# Patient Record
Sex: Female | Born: 1985 | Race: White | Hispanic: No | Marital: Single | Smoking: Never smoker
Health system: Southern US, Community
[De-identification: ages and names within clinical notes are randomized; demographics above are authoritative.]

## PROBLEM LIST (undated history)

## (undated) DIAGNOSIS — C73 Malignant neoplasm of thyroid gland: Secondary | ICD-10-CM

## (undated) DIAGNOSIS — R519 Headache, unspecified: Secondary | ICD-10-CM

## (undated) DIAGNOSIS — F32A Depression, unspecified: Secondary | ICD-10-CM

## (undated) DIAGNOSIS — E785 Hyperlipidemia, unspecified: Secondary | ICD-10-CM

## (undated) DIAGNOSIS — G40909 Epilepsy, unspecified, not intractable, without status epilepticus: Secondary | ICD-10-CM

## (undated) DIAGNOSIS — R413 Other amnesia: Secondary | ICD-10-CM

## (undated) DIAGNOSIS — T783XXA Angioneurotic edema, initial encounter: Secondary | ICD-10-CM

## (undated) DIAGNOSIS — E039 Hypothyroidism, unspecified: Secondary | ICD-10-CM

## (undated) DIAGNOSIS — I2510 Atherosclerotic heart disease of native coronary artery without angina pectoris: Secondary | ICD-10-CM

## (undated) DIAGNOSIS — E0590 Thyrotoxicosis, unspecified without thyrotoxic crisis or storm: Secondary | ICD-10-CM

## (undated) HISTORY — DX: Malignant neoplasm of thyroid gland: C73

## (undated) HISTORY — DX: Thyrotoxicosis, unspecified without thyrotoxic crisis or storm: E05.90

## (undated) HISTORY — DX: Angioneurotic edema, initial encounter: T78.3XXA

## (undated) HISTORY — PX: THYROID SURGERY: SHX805

---
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
## 2015-09-28 HISTORY — PX: IMPLANTATION VAGAL NERVE STIMULATOR: SUR692

## 2015-09-28 HISTORY — PX: OTHER SURGICAL HISTORY: SHX169

## 2020-02-23 DIAGNOSIS — C73 Malignant neoplasm of thyroid gland: Secondary | ICD-10-CM | POA: Insufficient documentation

## 2020-02-23 DIAGNOSIS — E559 Vitamin D deficiency, unspecified: Secondary | ICD-10-CM | POA: Insufficient documentation

## 2020-02-23 DIAGNOSIS — Z85850 Personal history of malignant neoplasm of thyroid: Secondary | ICD-10-CM | POA: Insufficient documentation

## 2020-08-11 ENCOUNTER — Other Ambulatory Visit: Payer: Self-pay | Admitting: Otolaryngology

## 2020-08-11 DIAGNOSIS — E041 Nontoxic single thyroid nodule: Secondary | ICD-10-CM

## 2020-08-11 DIAGNOSIS — C73 Malignant neoplasm of thyroid gland: Secondary | ICD-10-CM

## 2020-08-19 ENCOUNTER — Other Ambulatory Visit: Payer: Self-pay

## 2020-09-06 DIAGNOSIS — E7800 Pure hypercholesterolemia, unspecified: Secondary | ICD-10-CM | POA: Insufficient documentation

## 2020-09-06 DIAGNOSIS — R569 Unspecified convulsions: Secondary | ICD-10-CM | POA: Insufficient documentation

## 2020-09-06 DIAGNOSIS — E039 Hypothyroidism, unspecified: Secondary | ICD-10-CM | POA: Insufficient documentation

## 2020-12-15 ENCOUNTER — Other Ambulatory Visit: Payer: Self-pay

## 2020-12-15 ENCOUNTER — Ambulatory Visit
Admission: RE | Admit: 2020-12-15 | Discharge: 2020-12-15 | Disposition: A | Discharge status: Home / Self Care | Payer: Medicare Other | Source: Ambulatory Visit | Attending: Otolaryngology | Admitting: Otolaryngology

## 2020-12-15 DIAGNOSIS — C73 Malignant neoplasm of thyroid gland: Secondary | ICD-10-CM

## 2020-12-15 DIAGNOSIS — E041 Nontoxic single thyroid nodule: Secondary | ICD-10-CM

## 2020-12-15 IMAGING — US US THYROID
1 series · 13 of 25 positions shown · non-contrast
Comparison: None.

CLINICAL DATA: Other. History of papillary thyroid carcinoma status
post right hemi lobectomy.

EXAM:
THYROID ULTRASOUND
TECHNIQUE: Ultrasound examination of the thyroid gland and adjacent soft
tissues was performed.

[Series 1: us thyroid · 0.08mm/px · 13 of 32 slices shown]
[im 1/32]
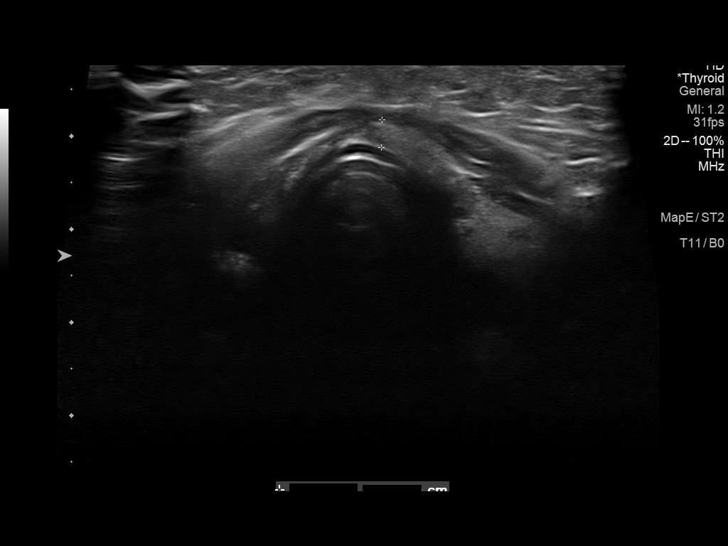
[im 3/32]
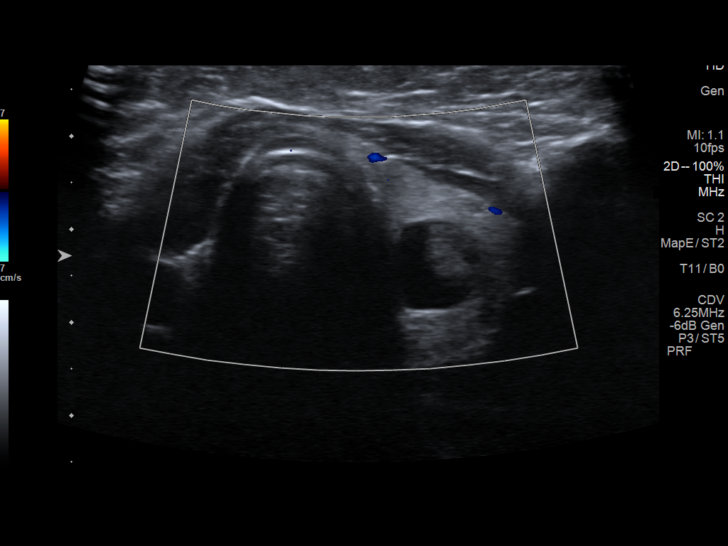
[im 6/32]
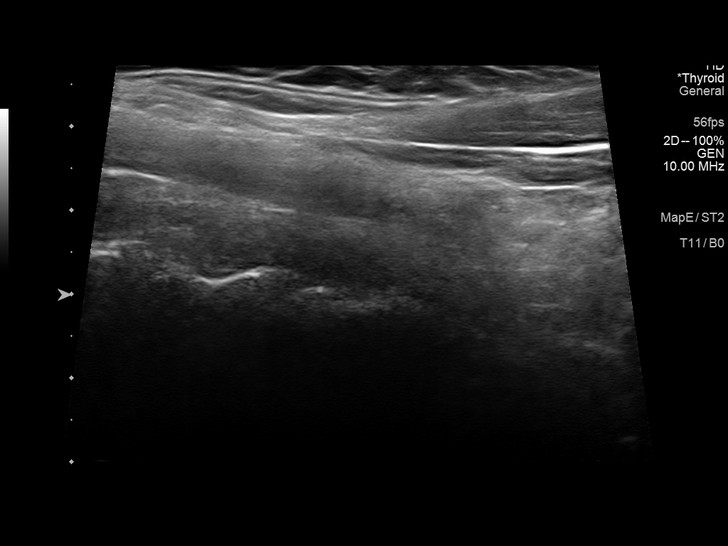
[im 8/32]
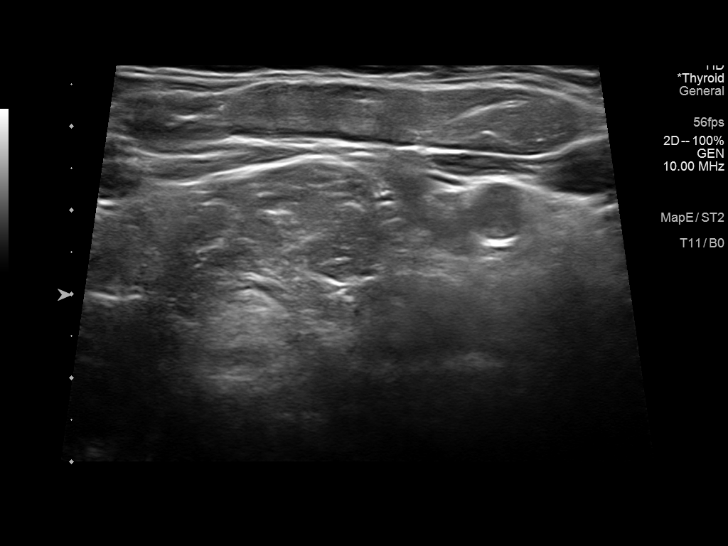
[im 11/32]
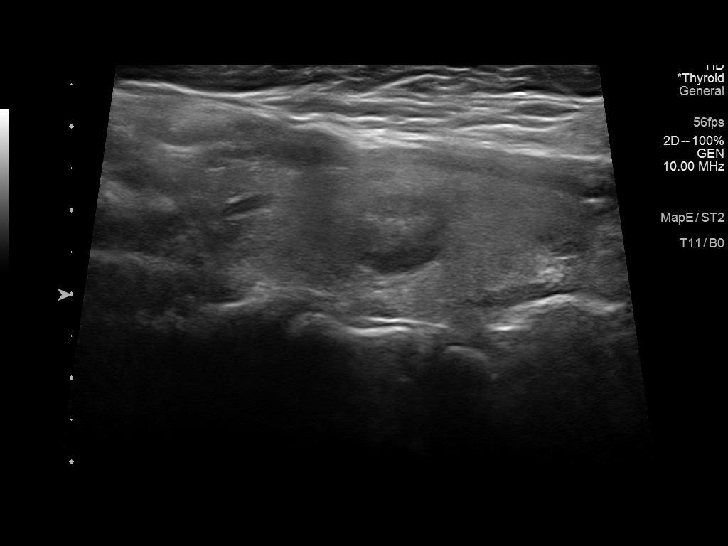
[im 13/32]
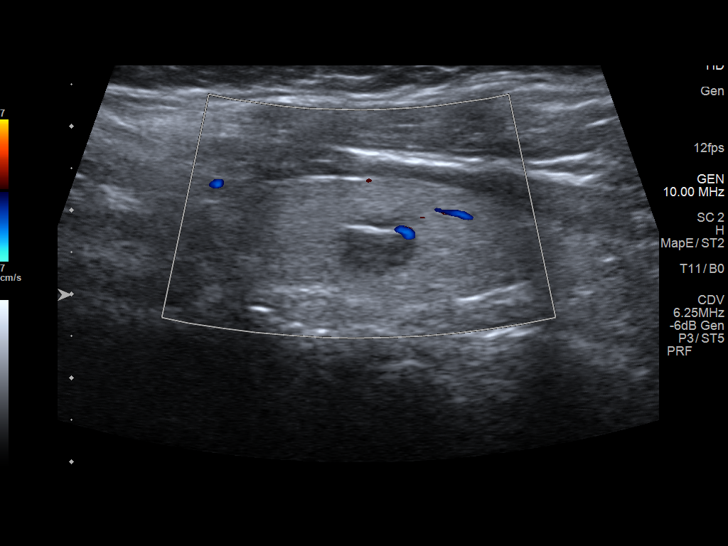
[im 16/32]
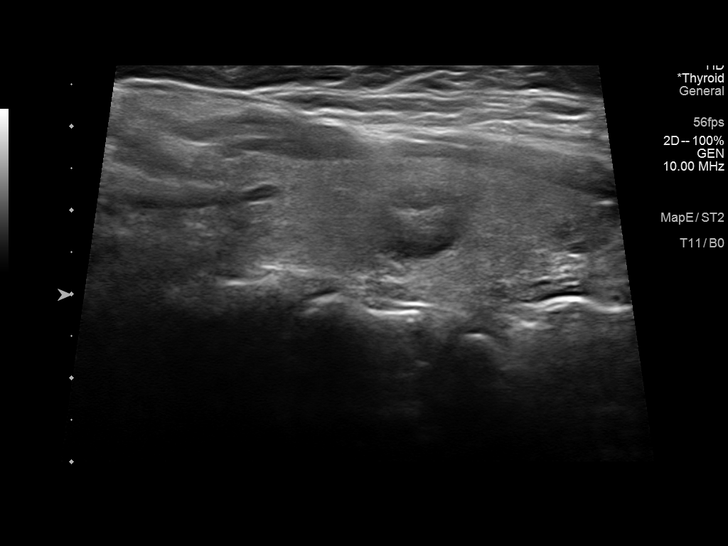
[im 19/32]
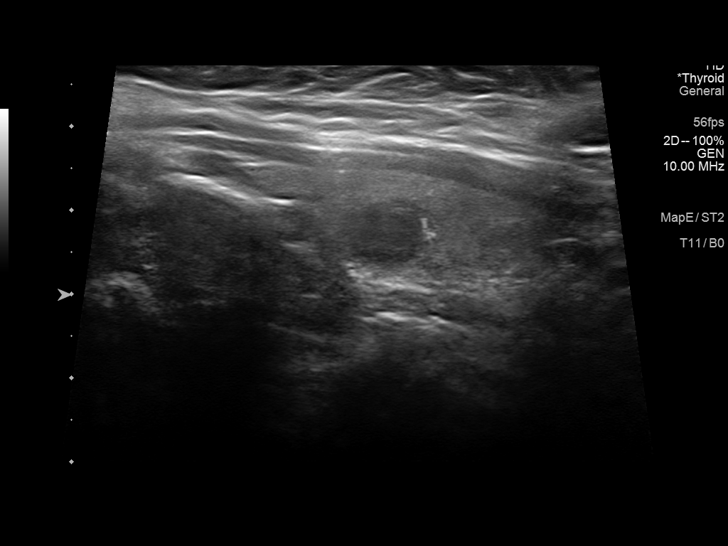
[im 21/32]
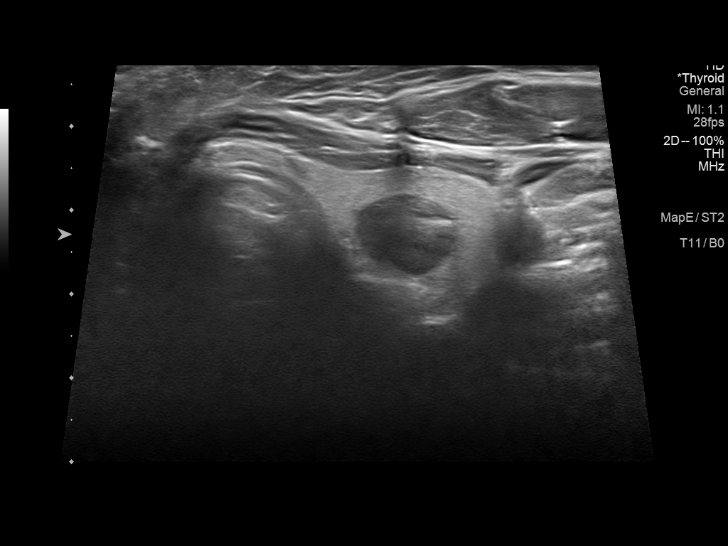
[im 24/32]
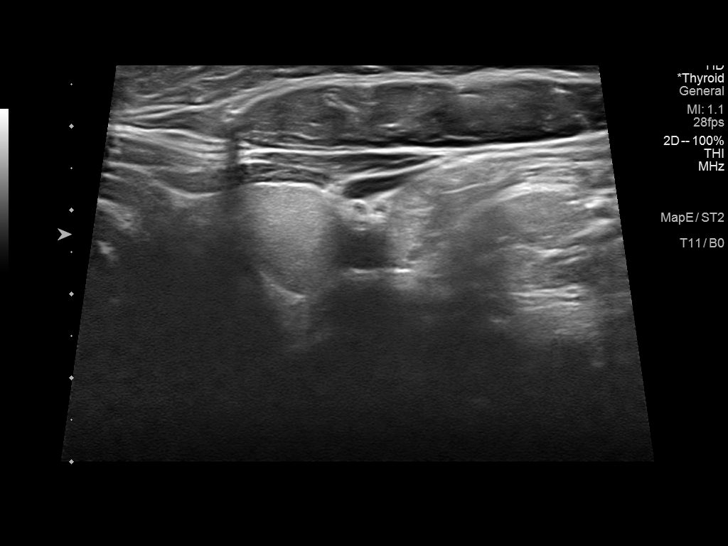
[im 26/32]
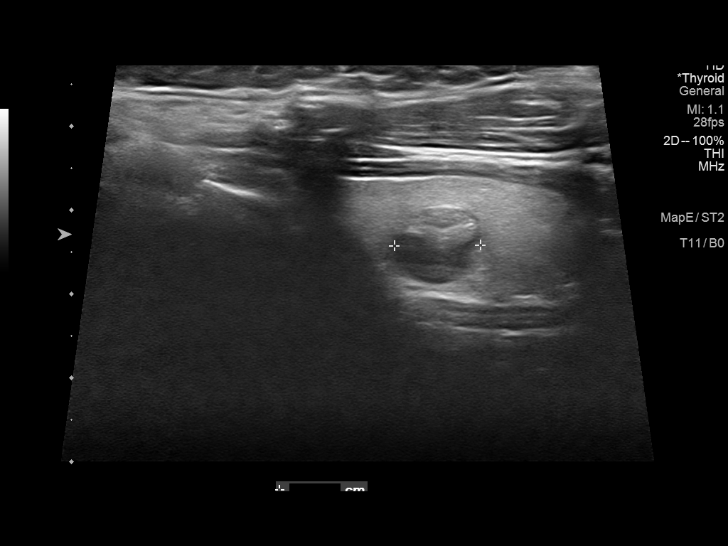
[im 29/32]
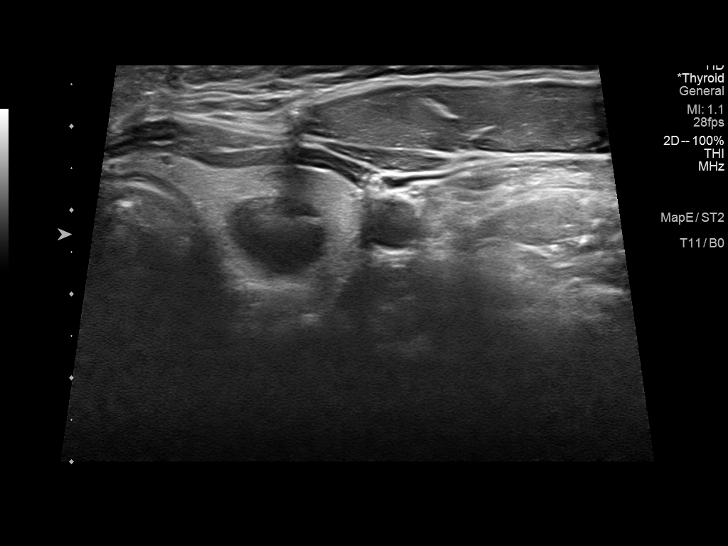
[im 32/32]
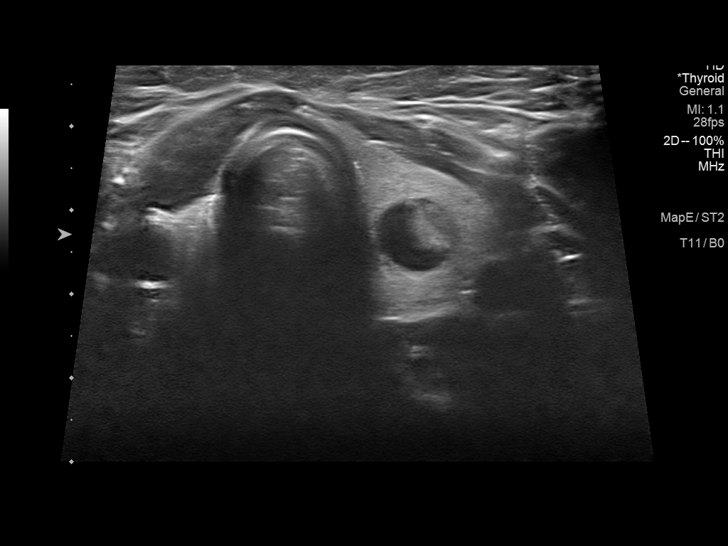

[13 of 25 positions shown; findings below may reference images not displayed]

FINDINGS: Parenchymal Echotexture: Mildly heterogenous

Isthmus: 0.3 cm

Right lobe: Surgically absent

Left lobe: 4.9 x 1.7 x 1.9 cm

_________________________________________________________

Estimated total number of nodules >/= 1 cm: 1

Number of spongiform nodules >/=  2 cm not described below (TR1): 0

Number of mixed cystic and solid nodules >/= 1.5 cm not described
below (TR2): 0

_________________________________________________________

Small 1.2 x 1.0 x 0.9 cm mixed cystic and solid nodule in the left
mid gland. The solid component is isoechoic. This is consistent with
a TI-RADS category 2, low risk lesion, and does not meet criteria to
warrant further evaluation.

Surgical changes of prior right hemithyroidectomy. No evidence of
residual or recurrent thyroid tissue or nodularity.
IMPRESSION: 1. Surgical changes of prior right hemithyroidectomy without
evidence of residual or recurrent thyroid tissue, nodularity or
lymphadenopathy.
2. Small 1.2 cm mixed cystic and solid nodule noted incidentally in
the left mid gland. This is considered sonographically low risk and
does not meet criteria for biopsy or imaging surveillance.

The above is in keeping with the ACR TI-RADS recommendations - [HOSPITAL] [80];[DATE].

## 2020-12-22 ENCOUNTER — Other Ambulatory Visit: Payer: Self-pay | Admitting: Orthopedic Surgery

## 2020-12-22 ENCOUNTER — Other Ambulatory Visit: Payer: Self-pay | Admitting: Otolaryngology

## 2020-12-22 DIAGNOSIS — E041 Nontoxic single thyroid nodule: Secondary | ICD-10-CM

## 2021-01-16 ENCOUNTER — Encounter: Payer: Self-pay | Admitting: Neurology

## 2021-02-14 ENCOUNTER — Other Ambulatory Visit: Payer: Self-pay

## 2021-02-14 ENCOUNTER — Ambulatory Visit (INDEPENDENT_AMBULATORY_CARE_PROVIDER_SITE_OTHER): Payer: Medicare Other | Admitting: Neurology

## 2021-02-14 ENCOUNTER — Encounter: Payer: Self-pay | Admitting: Neurology

## 2021-02-14 VITALS — BP 107/81 | HR 109 | Ht 63.0 in | Wt 205.0 lb

## 2021-02-14 DIAGNOSIS — G40219 Localization-related (focal) (partial) symptomatic epilepsy and epileptic syndromes with complex partial seizures, intractable, without status epilepticus: Secondary | ICD-10-CM

## 2021-02-14 MED ORDER — TOPIRAMATE 200 MG PO TABS: 200.0000 mg | ORAL_TABLET | Freq: Two times a day (BID) | ORAL | 3 refills | Status: DC

## 2021-02-14 NOTE — Patient Instructions (Signed)
Good to meet you!  Schedule MRI brain with and without contrast  2. Continue all your medications  3. Follow-up in 3-4 months, call for any changes   Seizure Precautions: 1. If medication has been prescribed for you to prevent seizures, take it exactly as directed.  Do not stop taking the medicine without talking to your doctor first, even if you have not had a seizure in a long time.   2. Avoid activities in which a seizure would cause danger to yourself or to others.  Don't operate dangerous machinery, swim alone, or climb in high or dangerous places, such as on ladders, roofs, or girders.  Do not drive unless your doctor says you may.  3. If you have any warning that you may have a seizure, lay down in a safe place where you can't hurt yourself.    4.  No driving for 6 months from last seizure, as per [facility].   Please refer to the following link on the [place] website for more information: http://[email or web address]/[email or web address]/drivingu.cfm   5.  Maintain good sleep hygiene.   6.  Notify your neurology if you are planning pregnancy or if you become pregnant.  7.  Contact your doctor if you have any problems that may be related to the medicine you are taking.  8.  Call 911 and bring the patient back to the ED if:        A.  The seizure lasts longer than 5 minutes.       B.  The patient doesn't awaken shortly after the seizure  C.  The patient has new problems such as difficulty seeing, speaking or moving  D.  The patient was injured during the seizure  E.  The patient has a temperature over 102 F (39C)  F.  The patient vomited and now is having trouble breathing

## 2021-02-14 NOTE — Progress Notes (Signed)
NEUROLOGY CONSULTATION NOTE  Sandra Collins MRN: [id number] DOB: 02-22-1986  Referring provider: Dr. Melissa Montane Primary care provider: Dr. Pricilla Holm  Reason for consult:  seizure  Dear Dr Raymond Gurney:  Thank you for your kind referral of Sandra Collins for consultation of the above symptoms. Although her history is well known to you, please allow me to reiterate it for the purpose of our medical record. The patient was accompanied to the clinic by her mother who also provides collateral information. Records and images were personally reviewed where available.   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 Records from Dr. Amalia Hailey and provided by her mother were reviewed. Seizures started at 29 months of age (10/1986). She was started on Depakote in 07/1989. She underwent EMU monitoring in [place] in 2007 and had left temporal lobe surgery in [place], [place] that was unsuccessful. VNS was placed in 2017. Main trigger noted for her seizures is a temperature of 99 degrees, whether from illness, physical activity, stress, or menstrual period. She started a CBD schedule in 2020 with her neurologist in [place] and they were able to wean off Lamotrigine in 2021. She continues on Topiramate 200mg  BID and CBD 70mg  BID. CBD is obtained from [employer] in [place].  Seizure semiology has included staring with bilateral hand automatisms followed by generalized tonic posturing, and drop attacks. She will have an aura of grabbing on to something then has loss of consciousness. Her mother notes shakiness and pupils dilate. On her last visit with Dr. Amalia Hailey in 08/2020, they reported her last seizure with loss of consciousness/GTC was in 12/2019, however today report that she had 4 seizures last year with spacing out or behavioral arrest for 15 seconds. She had a  seizure yesterday in her sleep occurring on and off from 2:30am to 6am. She almost fell off the bed. She was biting her pillows and teddy bear trying to stay calm. She could feel her body shaking a little bit for 15-30 seconds, occurring repeatedly and waking her up. She did have a low grade temperature and took Ibuprofen and 2mg  lorazepam, with no further events for the rest of the day. She denies any loss of consciousness with the recent seizures. She has rare body jerks. She denies any olfactory/gustatory hallucinations, focal numbness/tingling/weakness. She has rare migraines and takes Ibuprofen with good response. She has some swallowing difficulties due to her thyroid issues, and back pain due to scoliosis. No bowel/bladder dysfunction. Sleep is good. Mood is alright. She manages her own medications without issues.She lives with her mother and seizure dog. She does not drive.   Epilepsy Risk Factors:  A paternal aunt had epilepsy in childhood, 2 paternal cousins have epilepsy. She had a febrile convulsion in childhood. Otherwise she had a normal birth with no history of CNS infections such as meningitis/encephalitis, significant traumatic brain injury, neurosurgical procedures  Prior ASMs: Lamotrigine, Sabril, Dilantin, Trileptal, Lyrica, Onfi, Depakote, Zarontin, Felbatol, Neurontin, Tegretol, Phenobarbital    PAST MEDICAL HISTORY: Past Medical History:  Diagnosis Date   Hyperthyroidism    Thyroid cancer ([place])     PAST SURGICAL HISTORY: Past Surgical History:  Procedure Laterality Date   BRAIN SURGERY  05/03/2006   THYROID SURGERY     cancer   vns  09/28/2015    MEDICATIONS: Current Outpatient Medications on File Prior to Visit  Medication Sig Dispense Refill  Ascorbic Acid (VITAMIN C) 1000 MG tablet Take 1,000 mg by mouth daily.     Biotin 10000 MCG TABS Take by mouth.     calcium carbonate (OSCAL) 1500 (600 Ca) MG TABS tablet Take by mouth daily.     Cholecalciferol 50 MCG  (2000 UT) TABS Take by mouth.     [employer] FIBER GUMMY BEARS CHILDREN PO Take by mouth.     cyanocobalamin 1000 MCG tablet Take by mouth.     levothyroxine (SYNTHROID) 75 MCG tablet Take by mouth.     LORazepam (ATIVAN) 2 MG tablet Take 2 mg by mouth. As needed for seizures     medroxyPROGESTERone Acetate 150 MG/ML SUSY Inject into the muscle.     Multiple Vitamins-Minerals (MULTIVITAMIN WITH MINERALS) tablet Take 1 tablet by mouth daily.     NON FORMULARY Place 70 mg under the tongue in the morning and at bedtime. CBD oil     Omega-3 Fatty Acids (FISH OIL) 1200 MG CPDR Take 1 capsule by mouth daily.     topiramate (TOPAMAX) 200 MG tablet Take 200 mg by mouth 2 (two) times daily.     No current facility-administered medications on file prior to visit.    ALLERGIES: Allergies  Allergen Reactions   Estrogens Other (See Comments)    Other reaction(s): Seizures   Amoxicillin Hives   Latex Hives   Pregabalin Other (See Comments)    Excessive weight gain Excessive weight gain Excessive weight gain     FAMILY HISTORY: History reviewed. No pertinent family history.  SOCIAL HISTORY: Social History   Socioeconomic History   Marital status: Single    Spouse name: Not on file   Number of children: Not on file   Years of education: Not on file   Highest education level: Not on file  Occupational History   Not on file  Tobacco Use   Smoking status: Never   Smokeless tobacco: Never  Vaping Use   Vaping Use: Never used  Substance and Sexual Activity   Alcohol use: Yes    Comment: rare   Drug use: Never   Sexual activity: Not on file  Other Topics Concern   Not on file  Social History Narrative   Right handed   Lives with mom   Social Determinants of Health   Financial Resource Strain: Not on file  Food Insecurity: Not on file  Transportation Needs: Not on file  Physical Activity: Not on file  Stress: Not on file  Social Connections: Not on file  Intimate Partner Violence:  Not on file     PHYSICAL EXAM: Vitals:   02/14/21 0848  BP: 107/81  Pulse: (!) 109  SpO2: 96%   General: No acute distress Head:  Normocephalic/atraumatic Skin/Extremities: No rash, no edema Neurological Exam: Mental status: alert and oriented to person, place, and time, no dysarthria or aphasia, Fund of knowledge is appropriate.  Recent and remote memory are intact, 3/3 delayed recall.  Attention and concentration are normal, 5/5 WORLD backward. Cranial nerves: CN I: not tested CN II: pupils equal, round and reactive to light, visual fields intact CN III, IV, VI:  full range of motion, no nystagmus, no ptosis CN V: facial sensation intact CN VII: upper and lower face symmetric CN VIII: hearing intact to conversation Bulk & Tone: normal, no fasciculations. Motor: 5/5 throughout with no pronator drift. Sensation: intact to light touch, cold, pin, vibration and joint position sense.  No extinction to double simultaneous stimulation.  Romberg test  negative Deep Tendon Reflexes: brisk +2 throughout Cerebellar: no incoordination on finger to nose testing Gait: narrow-based and steady, able to tandem walk adequately. Tremor: none  VNS Therapy Management: Unit Information Implant Date: 09/28/15 Serial Number: [id number] Generator Number: [id number] ([id number] [id number]) Parameters Output Current (mA): 2 Signal Frequency (Hz): 25 Pulse Width (usec): 500 Signal ON Time (sec): 30 Signal OFF Time (min): 5 Magnet Output Current (mA): 2.25 Magnet ON Time (sec): 60 Magnet Pulse Width (usec): 250 AutoStim Output Current (mA): 2 AutoStim Pulse Width (usec): 250 AutoStim ON Time (sec): 60 Tachycardia Detection : On Heartbeat Detection Sensitivity: 3 Perform Verify Heartbeat Detection: yes Threshold for AutoStim (%): 20 Diagnostics Current Delievered (mA): 2 Lead Impedance: OK Impedence Value (Ohms): 2818 Battery Status Indicator (color): Green (11-25%)   IMPRESSION: This is a 35 year old  right-handed woman with 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 Records were reviewed, she has not had any brain imaging since 2007 as far as they know. They deny any GTCs since 2021, but she continues to have focal seizures with impaired awareness and prolonged nocturnal shaking episodes. MRI brain with and without contrast will be scheduled. We may consider repeating EEG in the future if they are interested in other medication adjustments, she has been on numerous seizure medications in the past and has found the best response with the combination of Topiramate 200mg  BID and CBD obtained from a pharmacy in [place]. She has prn lorazepam 2mg  for seizure rescue. Continue all medications. VNS interrogated today, battery 11-25%, will discuss scheduling battery replacement on her next visit. She does not drive. Follow-up in 3-4 months, call for any changes.   Thank you for allowing me to participate in the care of this patient. Please do not hesitate to call for any questions or concerns.   Ellouise Newer, M.D.  CC: Dr. Raymond Gurney, Dr. Sharlet Salina

## 2021-03-14 ENCOUNTER — Telehealth: Payer: Self-pay | Admitting: Neurology

## 2021-03-14 NOTE — Telephone Encounter (Signed)
Pt needs a call back to discuss what day and time she needs to turn off her device. She sch her MRI 03/27/21 monday

## 2021-03-14 NOTE — Telephone Encounter (Signed)
Called patient and advised her that Dr. Delice Lesch will need to turn off her VNS one hour before the MRI then come back after her MRI so Dr. Delice Lesch can turn her VNS back on. Patient is aware her MRI is on 10/31 at 11:30am and will be at our office at 10:30 am. Patient asked when she can take her Ativan for the MRI and Dr. Delice Lesch has instructed patient to take her Ativan 30 mins before her MRI. Patient verbalized understanding and had no further questions or concerns.

## 2021-03-14 NOTE — Telephone Encounter (Signed)
I see her appt on 10/31 is at 11:50am for MRI. I will need to turn off her VNS before the MRI, if she can come to the office an hour before so I can turn it off and she can be on time for MRI. She will then come back to the office after MRI so I can turn it back on. Thanks!

## 2021-03-27 ENCOUNTER — Ambulatory Visit
Admission: RE | Admit: 2021-03-27 | Discharge: 2021-03-27 | Disposition: A | Discharge status: Home / Self Care | Payer: Medicare Other | Source: Ambulatory Visit | Attending: Neurology | Admitting: Neurology

## 2021-03-27 ENCOUNTER — Other Ambulatory Visit: Payer: Medicare Other

## 2021-03-27 DIAGNOSIS — G40219 Localization-related (focal) (partial) symptomatic epilepsy and epileptic syndromes with complex partial seizures, intractable, without status epilepticus: Secondary | ICD-10-CM

## 2021-03-27 IMAGING — MR MR HEAD WO/W CM
12 series · 43 of 48 positions shown · IV contrast (multihance)
Comparison: None.

CLINICAL DATA: Seizure.

EXAM:
MRI HEAD WITHOUT AND WITH CONTRAST
TECHNIQUE: Multiplanar, multiecho pulse sequences of the brain and surrounding
structures were obtained without and with intravenous contrast.
CONTRAST:  19mL MULTIHANCE GADOBENATE DIMEGLUMINE 529 MG/ML IV SOLN

[Series 2: T1 · sagittal · 5.0mm · 0.45mm/px · 2 of 25 slices shown]
[im 1/25]
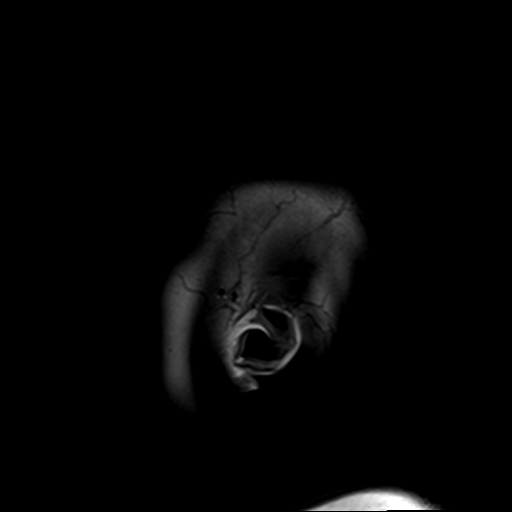
[im 25/25]
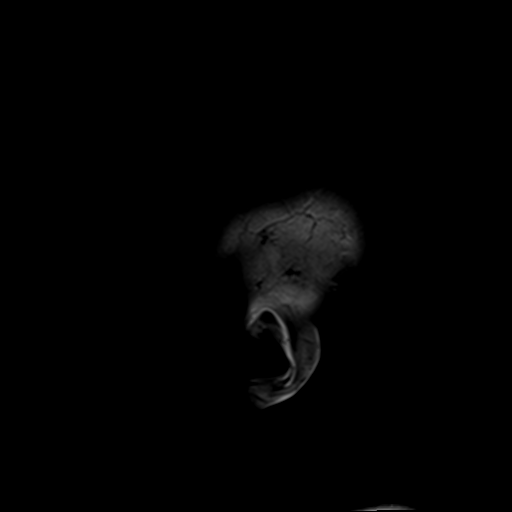

[Series 3: DWI · axial · 3.0mm · 1.80mm/px · z∈[-62,+82]mm · 7 of 100 slices shown (1 of 2)]
[im 1/100]
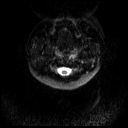
[im 17/100]
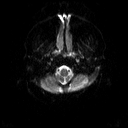
[im 34/100]
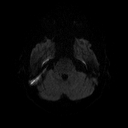
[im 50/100]
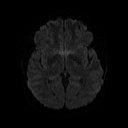
[im 67/100]
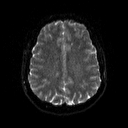
[im 83/100]
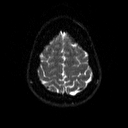
[im 100/100]
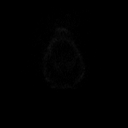

[Series 4: DWI · axial · 3.0mm · 1.80mm/px · z∈[-62,+82]mm · 4 of 50 slices shown (2 of 2)]
[im 1/50]
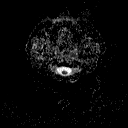
[im 17/50]
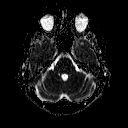
[im 33/50]
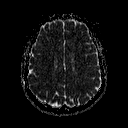
[im 50/50]
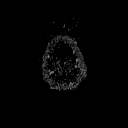

[Series 5: T2 · axial · 5.0mm · 0.60mm/px · z∈[-63,+77]mm · 2 of 22 slices shown (1 of 3)]
[im 1/22]
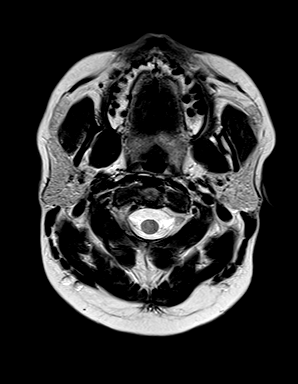
[im 22/22]
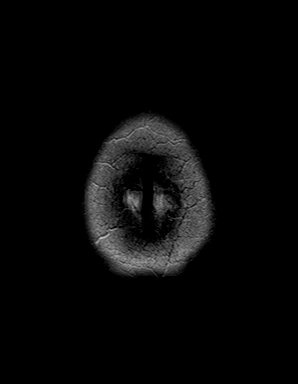

[Series 6: FLAIR · axial · 3.0mm · 0.45mm/px · z∈[-63,+78]mm · 2 of 32 slices shown (1 of 2)]
[im 1/32]
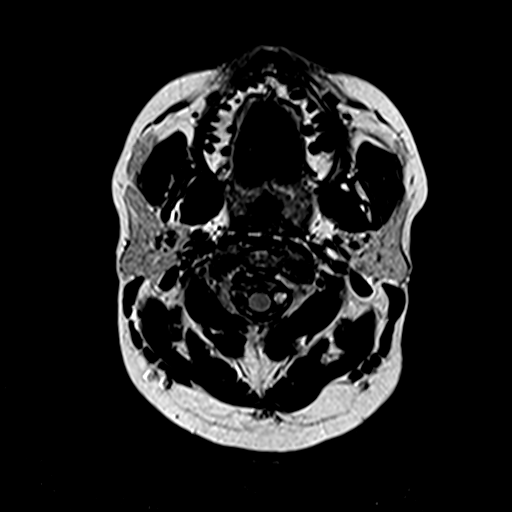
[im 32/32]
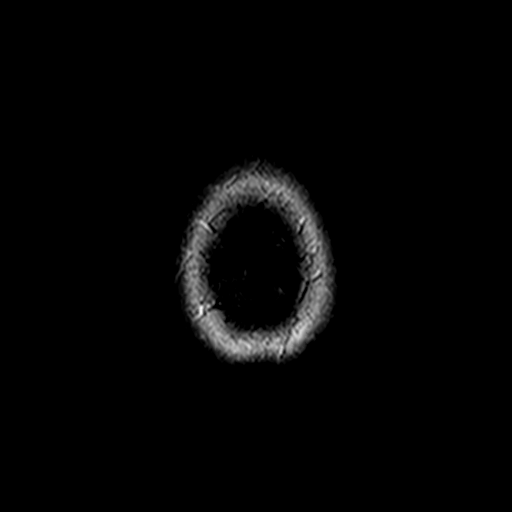

[Series 8: swi_images · axial · 4.0mm · 0.90mm/px · z∈[-61,+76]mm · 3 of 36 slices shown]
[im 1/36]
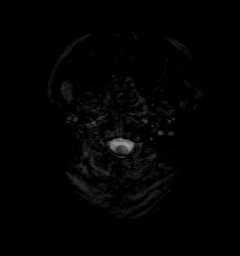
[im 18/36]
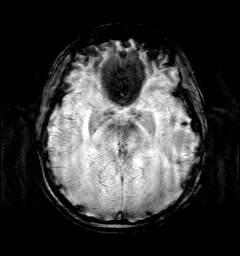
[im 36/36]
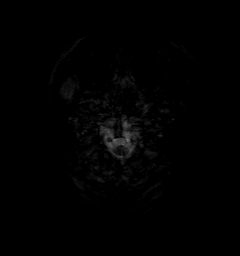

[Series 9: t1_mpr_tra · axial · 1.0mm · 0.71mm/px · z∈[-64,+77]mm · 8 of 144 slices shown (1 of 2)]
[im 1/144]
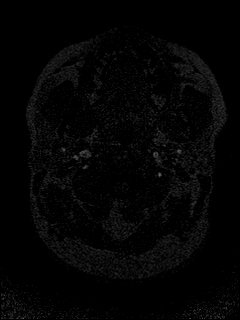
[im 16/144]
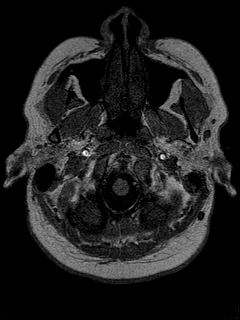
[im 48/144]
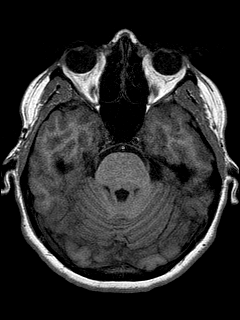
[im 64/144]
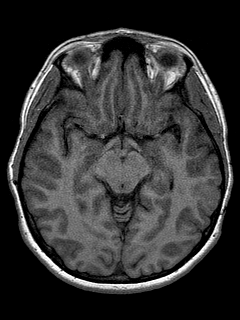
[im 80/144]
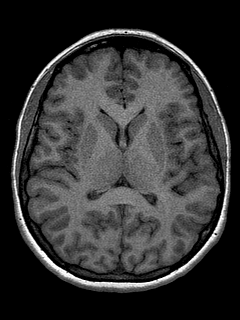
[im 96/144]
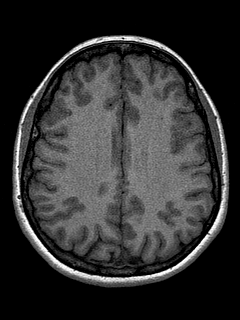
[im 128/144]
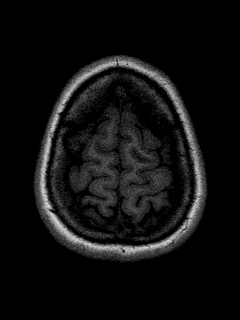
[im 144/144]
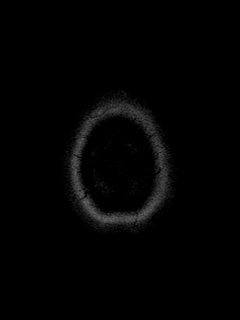

[Series 10: T2 · coronal · 3.0mm · 0.23mm/px · 2 of 29 slices shown (2 of 3)]
[im 1/29]
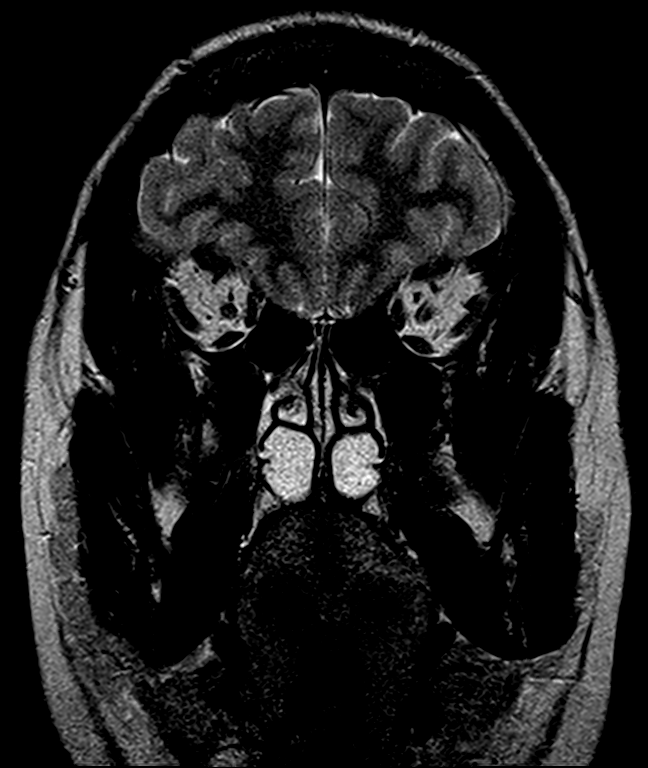
[im 29/29]
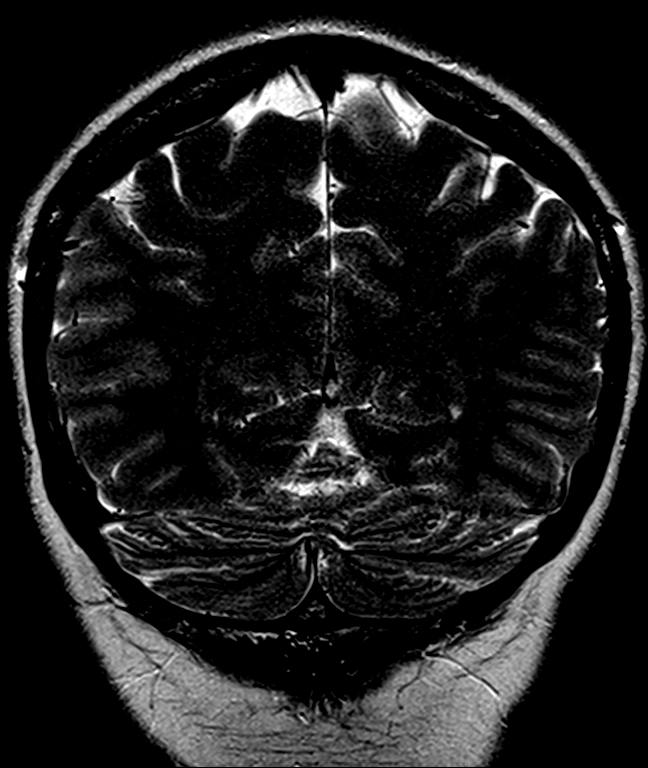

[Series 11: FLAIR · coronal · 3.0mm · 0.70mm/px · 2 of 29 slices shown (2 of 2)]
[im 1/29]
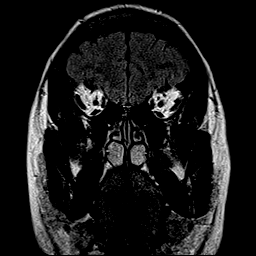
[im 29/29]
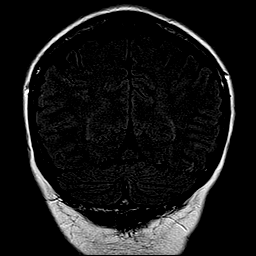

[Series 12: T2 · coronal · 5.0mm · 0.45mm/px · 2 of 27 slices shown (3 of 3)]
[im 1/27]
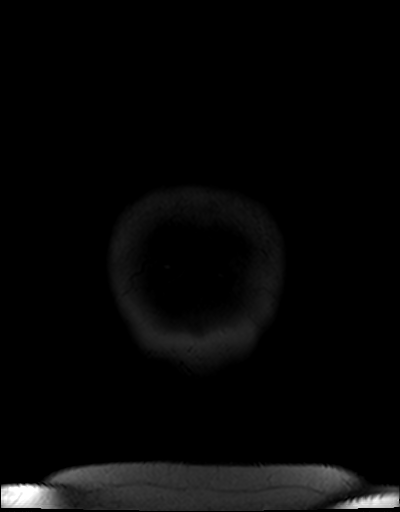
[im 27/27]
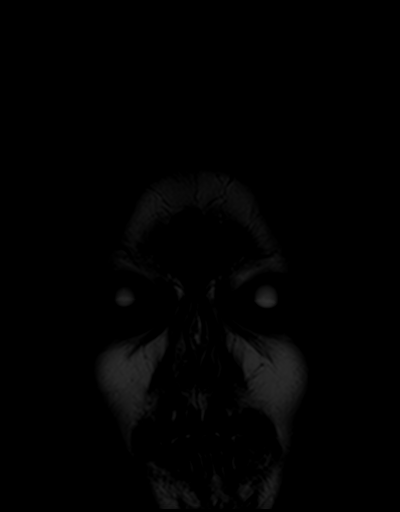

[Series 13: t1_mpr_tra · axial · 1.0mm · 0.71mm/px · z∈[-64,+77]mm · 8 of 144 slices shown (2 of 2)]
[im 1/144]
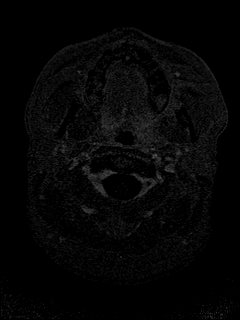
[im 16/144]
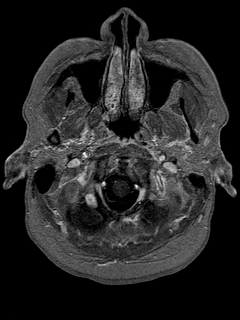
[im 48/144]
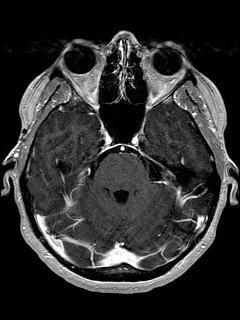
[im 64/144]
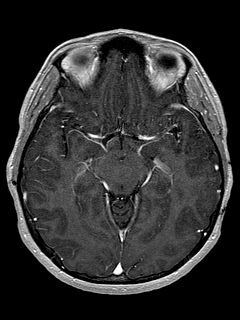
[im 80/144]
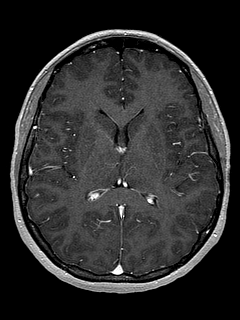
[im 96/144]
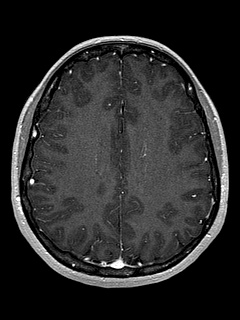
[im 128/144]
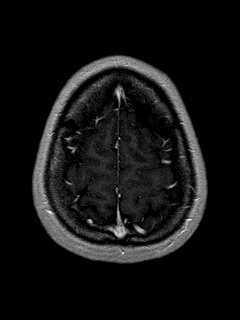
[im 144/144]
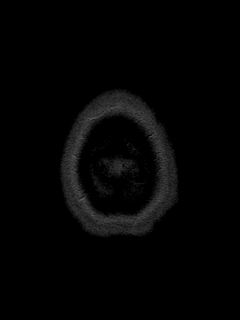

[Series 14: post cor · coronal · 5.0mm · 0.45mm/px · 1 of 27 slices shown]
[im 1/27]
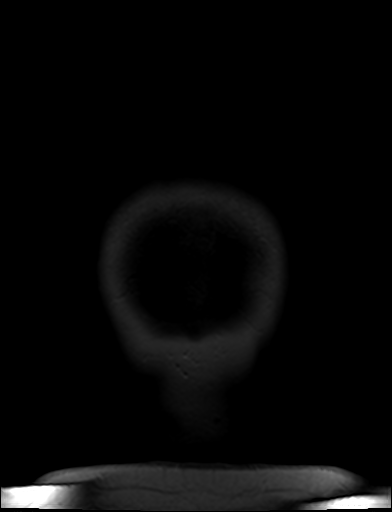

[43 of 48 positions shown; findings below may reference images not displayed]

FINDINGS: Brain: There is no evidence of an acute infarct, intracranial
hemorrhage, mass, midline shift, or extra-axial fluid collection. A
few small foci of T2 FLAIR hyperintensity in the left frontal white
matter may reflect nonspecific gliosis or possibly dilated
perivascular spaces given linear orientation on coronal images, of
doubtful clinical significance. The brain is unremarkable in signal
elsewhere.

Dedicated thin section imaging through the temporal lobes
demonstrates normal volume and signal of the hippocampi. There is no
evidence of heterotopia or cortical dysplasia.

Vascular: Major intracranial vascular flow voids are preserved.
Suspected superiorly projecting aneurysm arising from the left ICA
in the paraophthalmic region measuring approximately 5 mm.

Skull and upper cervical spine: Unremarkable bone marrow signal.

Sinuses/Orbits: Unremarkable orbits. Paranasal sinuses and mastoid
air cells are clear.

Other: None.
IMPRESSION: 1. No acute intracranial abnormality or etiology of seizures
identified.
2. Suspected 5 mm left paraophthalmic ICA aneurysm. Head MRA is
recommended for further evaluation.

## 2021-03-27 MED ORDER — GADOBENATE DIMEGLUMINE 529 MG/ML IV SOLN
19.0000 mL | Freq: Once | INTRAVENOUS | Status: AC | PRN
  Administered 2021-03-27: 19 mL via INTRAVENOUS

## 2021-03-30 ENCOUNTER — Telehealth: Payer: Self-pay | Admitting: Neurology

## 2021-03-30 DIAGNOSIS — I671 Cerebral aneurysm, nonruptured: Secondary | ICD-10-CM

## 2021-03-30 DIAGNOSIS — G40219 Localization-related (focal) (partial) symptomatic epilepsy and epileptic syndromes with complex partial seizures, intractable, without status epilepticus: Secondary | ICD-10-CM

## 2021-03-30 NOTE — Telephone Encounter (Signed)
Patient called stating she's seen her MRI results on MyChart and she is concerned that there may be a problem.  She is aware the doctor still needs to review the test and the office will call her back.

## 2021-03-31 NOTE — Addendum Note (Signed)
Addended by: Jake Seats on: 03/31/2021 10:57 AM   Modules accepted: Orders

## 2021-03-31 NOTE — Telephone Encounter (Signed)
Discussed MRI results with patient, no intracranial abnormalities. There is concern for a small aneurysm, recommend MRA head without contrast.   Heather, she has a VNS, pls order MRA head without contrast, she will need to have it turned off/on again for MRA. Thank you!

## 2021-03-31 NOTE — Telephone Encounter (Signed)
Order placed in Epic.

## 2021-04-05 ENCOUNTER — Telehealth: Payer: Self-pay | Admitting: Neurology

## 2021-04-05 NOTE — Telephone Encounter (Signed)
Pt called, said she made her appt for 05/08/21

## 2021-04-05 NOTE — Telephone Encounter (Signed)
Patient advised to reschedule mri per Dr.Aquino. patient agreed to do so.

## 2021-04-05 NOTE — Telephone Encounter (Signed)
Noted, thanks!

## 2021-04-05 NOTE — Telephone Encounter (Signed)
Pt will be here nov 29th in the morning to get her VNS turned off. Her MRI is set for 11am on 29th

## 2021-04-05 NOTE — Telephone Encounter (Signed)
Can you pls let her know that unfortunately I will be out of the office for 2 weeks after Thanksgiving, back in the office December 12. Can she try to reschedule the MRI any day after December 12 except Thursdays? Thank you!

## 2021-04-25 ENCOUNTER — Other Ambulatory Visit: Payer: Medicare Other

## 2021-04-26 DIAGNOSIS — F418 Other specified anxiety disorders: Secondary | ICD-10-CM | POA: Insufficient documentation

## 2021-04-26 DIAGNOSIS — R4589 Other symptoms and signs involving emotional state: Secondary | ICD-10-CM | POA: Insufficient documentation

## 2021-05-02 ENCOUNTER — Other Ambulatory Visit: Payer: Self-pay

## 2021-05-02 ENCOUNTER — Ambulatory Visit (INDEPENDENT_AMBULATORY_CARE_PROVIDER_SITE_OTHER): Payer: Medicare Other | Admitting: Internal Medicine

## 2021-05-02 ENCOUNTER — Encounter: Payer: Self-pay | Admitting: Internal Medicine

## 2021-05-02 VITALS — BP 122/82 | HR 101 | Resp 18 | Ht 63.0 in | Wt 208.2 lb

## 2021-05-02 DIAGNOSIS — E7800 Pure hypercholesterolemia, unspecified: Secondary | ICD-10-CM

## 2021-05-02 DIAGNOSIS — M419 Scoliosis, unspecified: Secondary | ICD-10-CM

## 2021-05-02 DIAGNOSIS — Z803 Family history of malignant neoplasm of breast: Secondary | ICD-10-CM

## 2021-05-02 DIAGNOSIS — R569 Unspecified convulsions: Secondary | ICD-10-CM

## 2021-05-02 DIAGNOSIS — E669 Obesity, unspecified: Secondary | ICD-10-CM | POA: Insufficient documentation

## 2021-05-02 DIAGNOSIS — Z23 Encounter for immunization: Secondary | ICD-10-CM

## 2021-05-02 DIAGNOSIS — Z6836 Body mass index (BMI) 36.0-36.9, adult: Secondary | ICD-10-CM

## 2021-05-02 DIAGNOSIS — E890 Postprocedural hypothyroidism: Secondary | ICD-10-CM | POA: Diagnosis not present

## 2021-05-02 DIAGNOSIS — R0683 Snoring: Secondary | ICD-10-CM | POA: Insufficient documentation

## 2021-05-02 DIAGNOSIS — I671 Cerebral aneurysm, nonruptured: Secondary | ICD-10-CM | POA: Insufficient documentation

## 2021-05-02 DIAGNOSIS — Z1231 Encounter for screening mammogram for malignant neoplasm of breast: Secondary | ICD-10-CM

## 2021-05-02 DIAGNOSIS — C73 Malignant neoplasm of thyroid gland: Secondary | ICD-10-CM

## 2021-05-02 DIAGNOSIS — F418 Other specified anxiety disorders: Secondary | ICD-10-CM

## 2021-05-02 NOTE — Progress Notes (Signed)
   Subjective:   Patient ID: Sandra Collins, female    DOB: 10-24-1985, 35 y.o.   MRN: [id number]  HPI The patient is a new 35 YO female coming in for several concerns as well as ongoing care. Mom present and helps to provide history some short term memory issues due to seizures since childhood.   PMH, [facility], social history reviewed and updated  Review of Systems  Constitutional: Negative.   HENT: Negative.    Eyes: Negative.   Respiratory:  Negative for cough, chest tightness and shortness of breath.   Cardiovascular:  Negative for chest pain, palpitations and leg swelling.  Gastrointestinal:  Negative for abdominal distention, abdominal pain, constipation, diarrhea, nausea and vomiting.  Musculoskeletal:  Positive for back pain.  Skin: Negative.   Neurological:  Positive for seizures.  Psychiatric/Behavioral: Negative.     Objective:  Physical Exam Constitutional:      Appearance: She is well-developed. She is obese.  HENT:     Head: Normocephalic and atraumatic.  Cardiovascular:     Rate and Rhythm: Normal rate and regular rhythm.  Pulmonary:     Effort: Pulmonary effort is normal. No respiratory distress.     Breath sounds: Normal breath sounds. No wheezing or rales.  Abdominal:     General: Bowel sounds are normal. There is no distension.     Palpations: Abdomen is soft.     Tenderness: There is no abdominal tenderness. There is no rebound.  Musculoskeletal:     Cervical back: Normal range of motion.  Skin:    General: Skin is warm and dry.  Neurological:     Mental Status: She is alert and oriented to person, place, and time.     Coordination: Coordination normal.    Vitals:   05/02/21 1024  BP: 122/82  Pulse: (!) 101  Resp: 18  SpO2: 99%  Weight: 208 lb 3.2 oz (94.4 kg)  Height: 5\' 3"  (1.6 m)    This visit occurred during the SARS-CoV-2 public health emergency.  Safety protocols were in place, including screening questions prior to the visit, additional  usage of staff PPE, and extensive cleaning of exam room while observing appropriate contact time as indicated for disinfecting solutions.   Flu shot given at visit  Assessment & Plan:  Visit time 45 minutes in face to face communication with patient and coordination of care, additional 20 minutes spent in record review, coordination or care, ordering tests, communicating/referring to other healthcare professionals, documenting in medical records all on the same day of the visit for total time 65 minutes spent on the visit.

## 2021-05-02 NOTE — Assessment & Plan Note (Signed)
She is staying with mom currently and mom has noticed that she stops breathing some while sleeping. Ordered sleep test today for home. Given that she has seizure disorder she would benefit from treatment as low oxygen levels while sleeping could trigger events.

## 2021-05-02 NOTE — Assessment & Plan Note (Signed)
S/P right thyroid lobe removal. Taking synthroid 75 mcg daily and recent labs at goal. No new signs of over or under replacement.

## 2021-05-02 NOTE — Assessment & Plan Note (Signed)
Ordered mammogram 2 family members with breast cancer including mom.

## 2021-05-02 NOTE — Assessment & Plan Note (Signed)
Prior right thyroid lobectomy and new nodule left thyroid which is in serial imaging. Most recent US was more benign appearing. She is seeing ENT as well. If needed we can order thyroid labs and/or [place] for monitoring. Takes synthroid 75 mcg daily.

## 2021-05-02 NOTE — Assessment & Plan Note (Signed)
Noted on recent MRI and scheduled for MRA next week. She has neurologist who detected. Counseled her that the MRA will check location, verify size and that any potential plan would proceed from there. Depending on size and location this may or may not qualify to be fixed and may just require serial monitoring. She was very concerned about this.

## 2021-05-02 NOTE — Assessment & Plan Note (Signed)
Seeing neurology for this and current with vns and topiramate and cbd oil to help. She is on depo-provera as body temp is a trigger for seizures for her and ovulation causes fluctuation in temperature.

## 2021-05-02 NOTE — Patient Instructions (Addendum)
We will get the mammogram done and the home sleep test.

## 2021-05-02 NOTE — Addendum Note (Signed)
Addended by: Thomes Cake on: 05/02/2021 01:14 PM   Modules accepted: Orders

## 2021-05-02 NOTE — Assessment & Plan Note (Signed)
She is drinking 2 sodas per day and they may or may not be able to change. We talked about making small changes to diet over time to help change diet. She is taking depo-provera monthly and this has caused a lot of weight gain.

## 2021-05-02 NOTE — Assessment & Plan Note (Signed)
She has had a lot of serious medical problems and her concern is normal and understandable. Advised to consider some mild exercise to help and counseling potentially as well.

## 2021-05-02 NOTE — Assessment & Plan Note (Signed)
She is aware and taking otc cholesterol supplement. Will recheck in 6 months or so.

## 2021-05-02 NOTE — Assessment & Plan Note (Signed)
Taking flexeril as needed for pain. This causes some restriction and perception of SOB.

## 2021-05-04 ENCOUNTER — Telehealth: Payer: Self-pay | Admitting: Internal Medicine

## 2021-05-04 NOTE — Telephone Encounter (Signed)
Patient states provider referred her for a mammogram  Patient states she was informed by rep she could not schedule an appt due to her age  Patient is requesting a call back

## 2021-05-05 NOTE — Telephone Encounter (Signed)
See below

## 2021-05-08 ENCOUNTER — Telehealth: Payer: Self-pay | Admitting: Neurology

## 2021-05-08 ENCOUNTER — Other Ambulatory Visit: Payer: Self-pay

## 2021-05-08 ENCOUNTER — Ambulatory Visit
Admission: RE | Admit: 2021-05-08 | Discharge: 2021-05-08 | Disposition: A | Discharge status: Home / Self Care | Payer: Medicare Other | Source: Ambulatory Visit | Attending: Neurology | Admitting: Neurology

## 2021-05-08 ENCOUNTER — Telehealth: Payer: Self-pay

## 2021-05-08 DIAGNOSIS — I671 Cerebral aneurysm, nonruptured: Secondary | ICD-10-CM

## 2021-05-08 IMAGING — MR MR MRA HEAD W/O CM
1 series · 11 of 48 positions shown · non-contrast
Comparison: Brain MRI [DATE].

CLINICAL DATA: Cerebral aneurysm.

EXAM:
MRA HEAD WITHOUT CONTRAST
TECHNIQUE: Angiographic images of the Circle of Willis were acquired using MRA
technique without intravenous contrast.

[Series 2: [id number] · axial · 0.6mm · 0.26mm/px · z∈[-59,+28]mm · 11 of 162 slices shown]
[im 11/162]
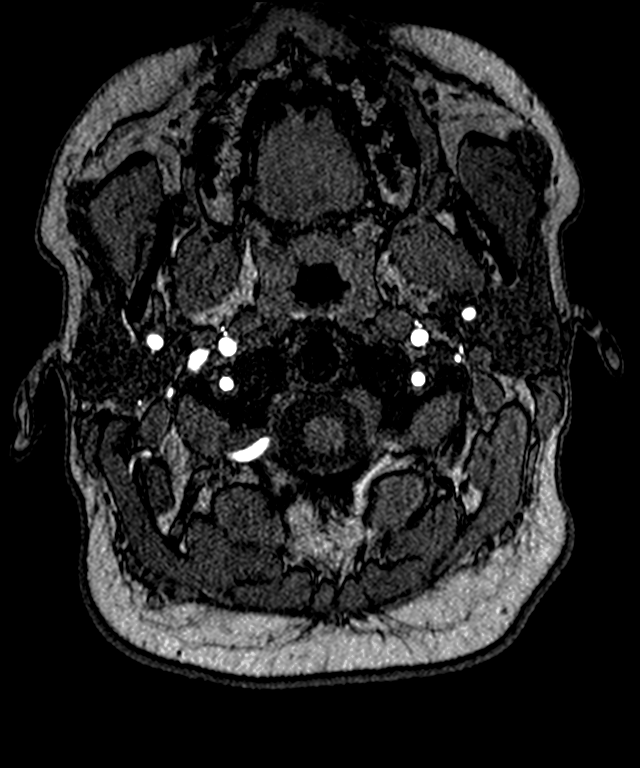
[im 28/162]
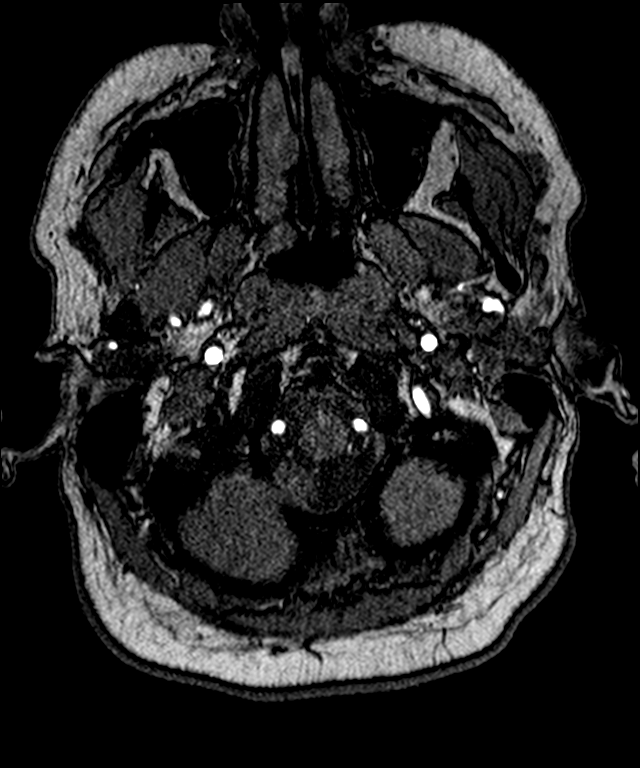
[im 31/162]
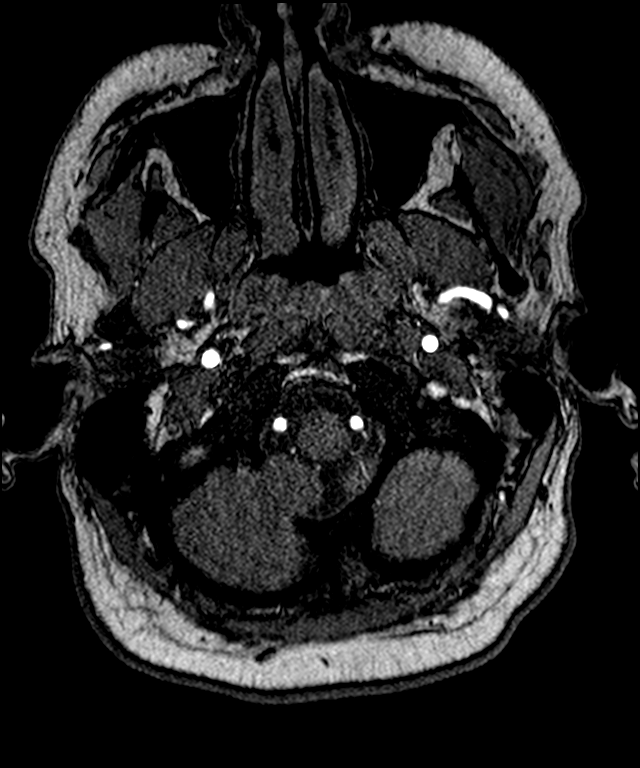
[im 52/162]
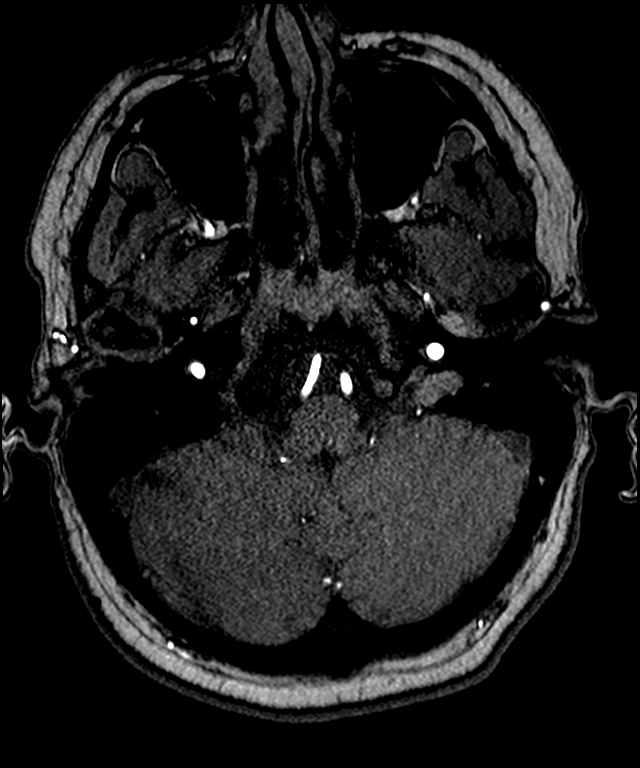
[im 72/162]
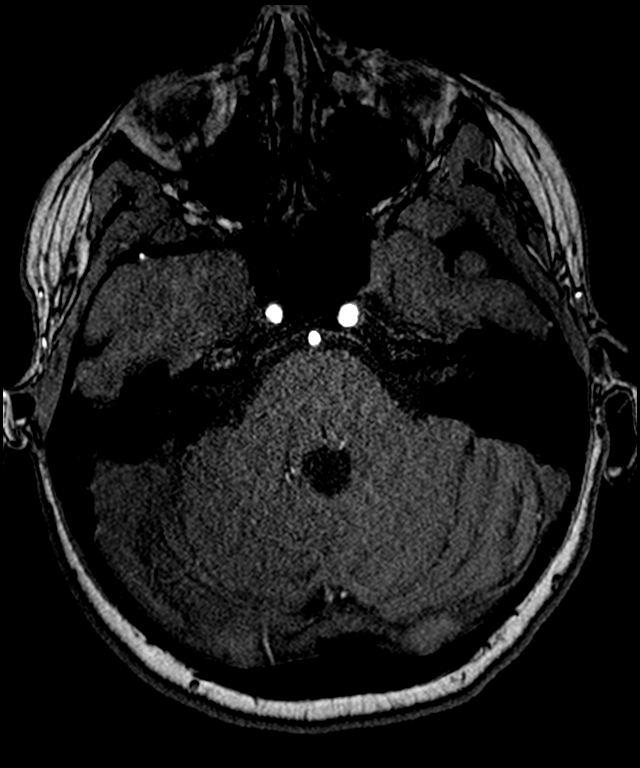
[im 83/162]
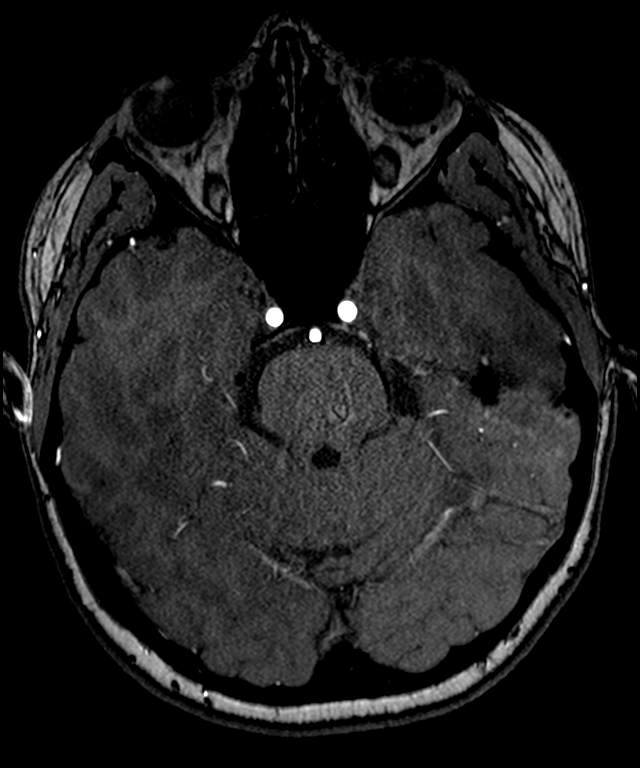
[im 93/162]
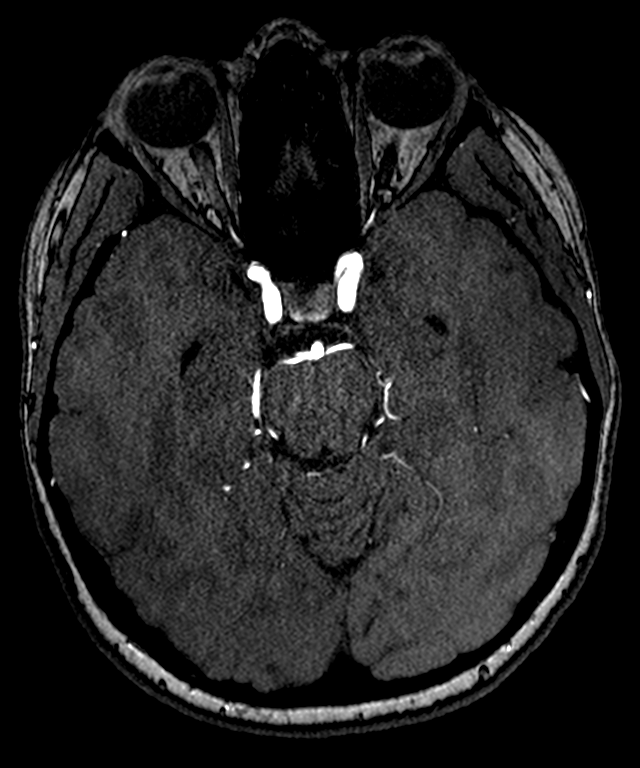
[im 114/162]
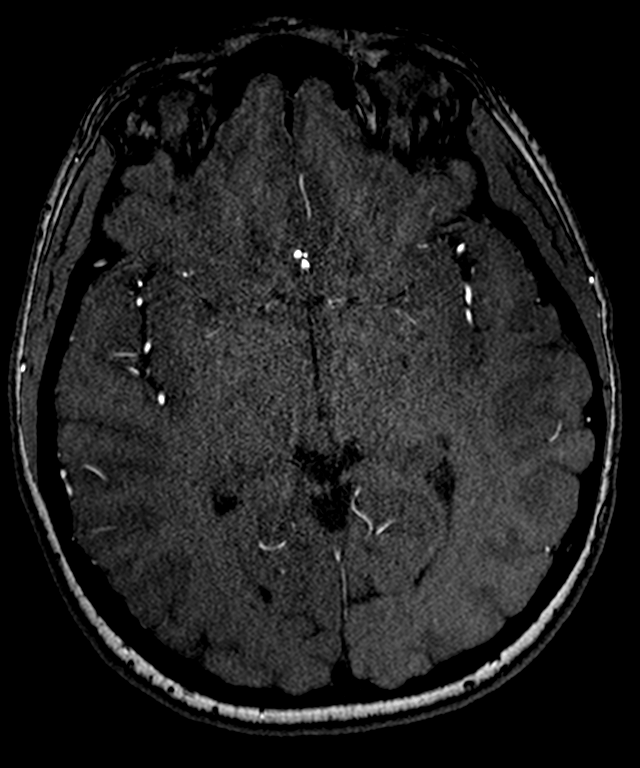
[im 134/162]
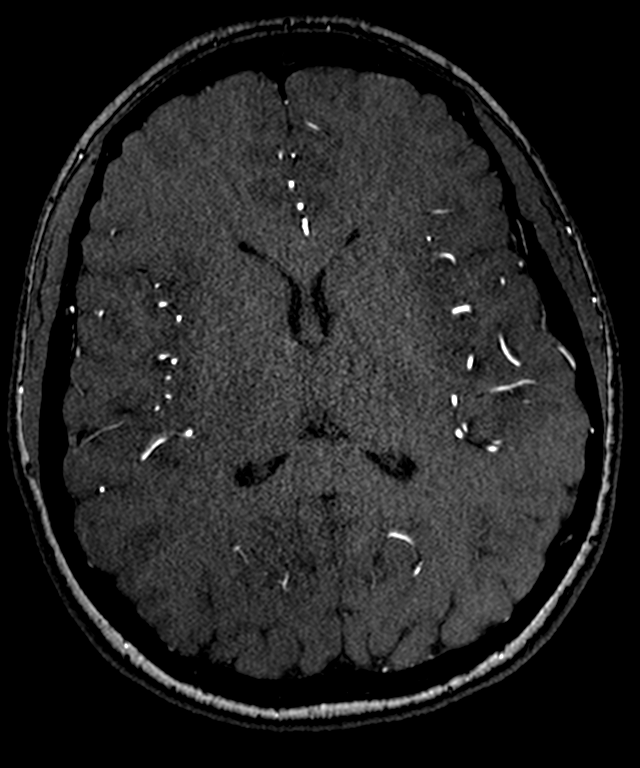
[im 138/162]
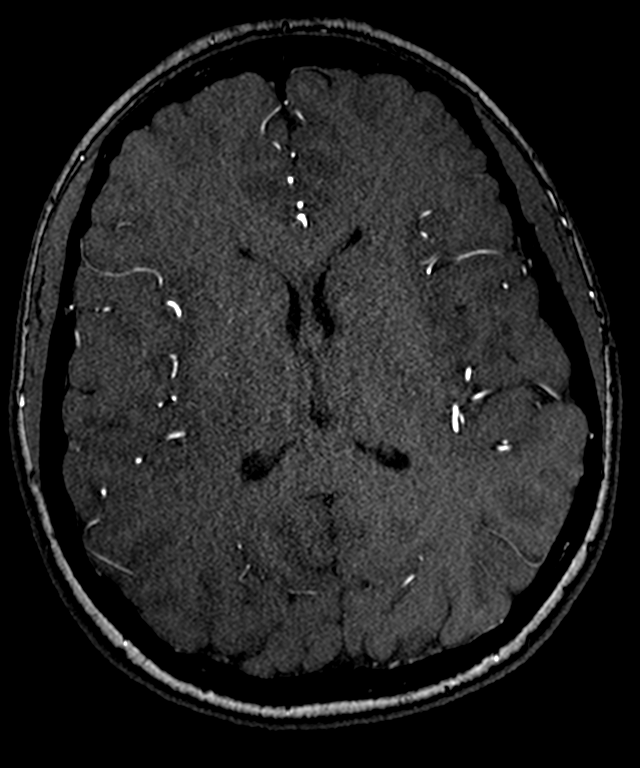
[im 155/162]
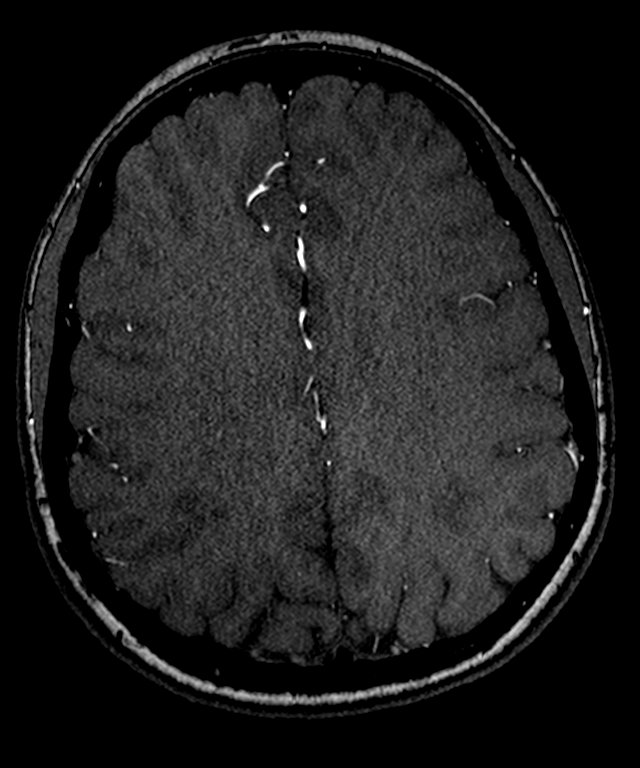

[11 of 48 positions shown; findings below may reference images not displayed]

FINDINGS: Anterior circulation:

The intracranial internal carotid arteries are patent. The M1 middle
cerebral arteries are patent. No M2 proximal branch occlusion or
high-grade proximal stenosis is identified. The anterior cerebral
arteries are patent.

6 x 5 mm superiorly projecting aneurysm arising from the left ICA,
from the paraophthalmic region.

Posterior circulation:

The intracranial vertebral arteries are patent. The basilar artery
is patent. The posterior cerebral arteries are patent. Posterior
communicating arteries are present bilaterally.

Anatomic variants: None significant

Impression #1 will be called to the ordering clinician or
representative by the Radiologist Assistant, and communication
documented in the PACS or [REDACTED].
IMPRESSION: Confirmed paraophthalmic left ICA aneurysm, measuring 6 x 5 mm.
Neuro-interventional consultation is recommended.

No intracranial large vessel occlusion or proximal high-grade
arterial stenosis.

## 2021-05-08 NOTE — Telephone Encounter (Signed)
I would recommend we either order an ultrasound (this is the right test based on age) or she could go to an ob/gyn and they would likely be able to do the mammogram at their office. I cannot control scheduling of this.

## 2021-05-08 NOTE — Telephone Encounter (Signed)
Dr Delice Lesch is aware an order is placed to interventional radiology

## 2021-05-08 NOTE — Telephone Encounter (Signed)
[facility] called with a call report Confirmed paraophthalmic left ICA aneurysm, measuring 6 x 5 mm. Neuro-interventional consultation is recommended. Dr Delice Lesch is aware

## 2021-05-08 NOTE — Telephone Encounter (Signed)
See my chart message

## 2021-05-11 ENCOUNTER — Telehealth: Payer: Self-pay | Admitting: Neurology

## 2021-05-11 ENCOUNTER — Telehealth: Payer: Self-pay | Admitting: Internal Medicine

## 2021-05-11 NOTE — Telephone Encounter (Signed)
Pls let patient know the MRA showed the same change as the last time with the small 65mm aneurysm, no new changes, it is not bigger. When we see these changes in the blood vessels, we refer patients to a vascular specialist, Dr. Estanislado Pandy. They will call her to schedule appt, thanks

## 2021-05-11 NOTE — Telephone Encounter (Signed)
Patient is requesting a new rx from current provider for medroxyPROGESTERone Acetate 150 MG/ML [place], [address] AT [place] [place]

## 2021-05-11 NOTE — Telephone Encounter (Signed)
Generally when I am prescribing this we administer to patient in office every 3 months. I will need information about last dose date of this. Depending on when we can schedule in office for this.

## 2021-05-11 NOTE — Telephone Encounter (Signed)
Pt called an informed MRA showed the same change as the last time with the small 34mm aneurysm, no new changes, it is not bigger. When we see these changes in the blood vessels, we refer patients to a vascular specialist, Dr. Estanislado Pandy. They will call her to schedule appt, referral placed in epic on 12/12

## 2021-05-11 NOTE — Telephone Encounter (Signed)
Patient would like a call back to discuss her results. 

## 2021-05-12 NOTE — Telephone Encounter (Signed)
See my chart message

## 2021-05-24 ENCOUNTER — Telehealth: Payer: Self-pay | Admitting: Neurology

## 2021-05-24 NOTE — Telephone Encounter (Signed)
Pls let her know this would be either through her gynecologist or PCP, thanks

## 2021-05-24 NOTE — Telephone Encounter (Signed)
Pt is calling in to request that the Birth control medroxyPROGESTERone Acetate be called in to the pharmacy where she can pick it up and her mother give her the injections as her insurance does not pay for the medications and the Pharmacy gives her a coupon to use to make it more affordable.   Please call pt to update with further information.

## 2021-05-24 NOTE — Telephone Encounter (Signed)
Pt called an informed that she needs to have  this refilled  through her gynecologist or PCP. Pt stated she needed with in a week, pt advised she needed to call one of them to get it filled our office dose not prescribe Depo. Pt said thank you for trying she would call her mom to let her know,

## 2021-05-24 NOTE — Telephone Encounter (Signed)
1. Which medications need refilled? (List name and dosage, if known) medroxyprogesterone to stop her seizures  2. Which pharmacy/location is medication to be sent to? (include street and city if Management consultant) [facility] on [address]

## 2021-05-25 NOTE — Telephone Encounter (Signed)
See below

## 2021-05-30 NOTE — Telephone Encounter (Signed)
Patient checking status of  medroxyPROGESTERone Acetate 150 MG/ML SUSY refill request  Patient states she is starting to have onset seizures symptoms  Patient is requesting refill sent to pharmacy today and will bring it to appt tomorrow   Patient schedule for injection tomorrow 05-31-2021  Pharmacy [facility] [place], [address] AT [place]  *see below*

## 2021-05-31 ENCOUNTER — Other Ambulatory Visit: Payer: Self-pay

## 2021-05-31 ENCOUNTER — Telehealth: Payer: Self-pay | Admitting: Neurology

## 2021-05-31 ENCOUNTER — Ambulatory Visit (INDEPENDENT_AMBULATORY_CARE_PROVIDER_SITE_OTHER): Payer: Medicare Other

## 2021-05-31 DIAGNOSIS — R569 Unspecified convulsions: Secondary | ICD-10-CM

## 2021-05-31 MED ORDER — MEDROXYPROGESTERONE ACETATE 150 MG/ML IM SUSP
150.0000 mg | Freq: Once | INTRAMUSCULAR | Status: AC
  Administered 2021-05-31: 150 mg via INTRAMUSCULAR

## 2021-05-31 NOTE — Progress Notes (Signed)
Pt was given Medroxyprogesterone 150mg /ml w/o any complications.

## 2021-05-31 NOTE — Telephone Encounter (Signed)
Patient has an ultrasound next week, she needs to know if her VNS needs to be shut off

## 2021-06-05 ENCOUNTER — Telehealth: Payer: Self-pay

## 2021-06-05 NOTE — Telephone Encounter (Signed)
Pt called and advised for hr VNS for ultra sound should be okay, but for her to let the ultrasound people know as well so they are aware and if they want Dr Delice Lesch to turn it off, she can, but typically not need, unless the ultrasound folks want it off

## 2021-06-05 NOTE — Telephone Encounter (Signed)
It should be okay, no need for VNS to be turned off for thyroid ultrasound, thanks

## 2021-06-05 NOTE — Telephone Encounter (Signed)
See other phone note

## 2021-06-05 NOTE — Telephone Encounter (Signed)
Patient called and said she is having her ultrasound on Wednesday, 06/07/21.   She is needing to get an answer as soon as possible.

## 2021-06-07 ENCOUNTER — Ambulatory Visit
Admission: RE | Admit: 2021-06-07 | Discharge: 2021-06-07 | Disposition: A | Discharge status: Home / Self Care | Payer: Medicare Other | Source: Ambulatory Visit | Attending: Otolaryngology | Admitting: Otolaryngology

## 2021-06-07 ENCOUNTER — Telehealth: Payer: Self-pay | Admitting: Neurology

## 2021-06-07 DIAGNOSIS — E041 Nontoxic single thyroid nodule: Secondary | ICD-10-CM

## 2021-06-07 NOTE — Telephone Encounter (Signed)
Patient would like to speak with someone about an appt she is supposed to have regarding her brain aneurism. She said aquino was supposed to send a referral for a vascular specialist

## 2021-06-07 NOTE — Telephone Encounter (Signed)
Pt called no answer left a voice mail with Luanne Bras, MD number. Pt called back after I left the voice mail and was verbally given his phone number.

## 2021-06-08 ENCOUNTER — Other Ambulatory Visit (HOSPITAL_COMMUNITY): Payer: Self-pay | Admitting: Interventional Radiology

## 2021-06-08 DIAGNOSIS — I671 Cerebral aneurysm, nonruptured: Secondary | ICD-10-CM

## 2021-06-09 ENCOUNTER — Ambulatory Visit (HOSPITAL_COMMUNITY)
Admission: RE | Admit: 2021-06-09 | Discharge: 2021-06-09 | Disposition: A | Discharge status: Home / Self Care | Payer: Medicare Other | Source: Ambulatory Visit | Attending: Interventional Radiology | Admitting: Interventional Radiology

## 2021-06-09 ENCOUNTER — Other Ambulatory Visit: Payer: Self-pay | Admitting: Otolaryngology

## 2021-06-09 ENCOUNTER — Other Ambulatory Visit: Payer: Self-pay

## 2021-06-09 DIAGNOSIS — C73 Malignant neoplasm of thyroid gland: Secondary | ICD-10-CM

## 2021-06-09 DIAGNOSIS — I671 Cerebral aneurysm, nonruptured: Secondary | ICD-10-CM

## 2021-06-12 HISTORY — PX: IR RADIOLOGIST EVAL & MGMT: IMG5224

## 2021-06-20 ENCOUNTER — Ambulatory Visit (INDEPENDENT_AMBULATORY_CARE_PROVIDER_SITE_OTHER): Payer: Medicare Other | Admitting: Neurology

## 2021-06-20 ENCOUNTER — Encounter: Payer: Self-pay | Admitting: Neurology

## 2021-06-20 ENCOUNTER — Other Ambulatory Visit: Payer: Self-pay

## 2021-06-20 VITALS — BP 125/90 | HR 87 | Ht 63.0 in | Wt 208.4 lb

## 2021-06-20 DIAGNOSIS — G40219 Localization-related (focal) (partial) symptomatic epilepsy and epileptic syndromes with complex partial seizures, intractable, without status epilepticus: Secondary | ICD-10-CM | POA: Diagnosis not present

## 2021-06-20 NOTE — Progress Notes (Signed)
NEUROLOGY FOLLOW UP OFFICE NOTE  Sandra Collins [id number] [birth year]  HISTORY OF PRESENT ILLNESS: I had the pleasure of seeing Sandra Collins in follow-up in the neurology clinic on 06/20/2021.  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 She is again accompanied by her mother who helps supplement the history today. Records and images were personally reviewed where available.  I personally reviewed MRI brain with and without contrast done 02/2021 which did not show any acute changes. There were a few small foci of increased FLAIR signal in the left frontal white matter, symmetric hippocampi. There was an incidental finding of a 24mm left ICA paraophthalmic aneurysm confirmed on follow-up MRA done 04/2021. She has kindly been evaluated by Dr. Estanislado Pandy with plans for a diagnostic angiogram and endovascular treatment.   Since her last visit, they report she has been doing well from a seizure standpoint, no seizures since 02/2021. She had a different type of event a couple of weeks ago, they both report it was not a seizure, she came out of the bathroom and lost consciousness briefly, Max found her. She has also been having more headaches/ migraines, she usually takes Ibuprofen and lies down with improvement in headache. No nausea/vomiting.The last time she needed lorazepam was for the MRA. She continues on a combination of Topiramate 200mg  BID and CBD obtained from a pharmacy in [place] for seizure prophylaxis. Her mother reports that since she got the results of the MRI showing an aneurysm, she has been different. There is a lot of anxiety about her upcoming procedure, they report her stress level has been going up. Her mother reports that she goes to her room a lot, it is hard to get her outside. She does walk her dog still. She sleeps a lot due to the headaches, sleeping from 10pm to 8am, then napping a couple of hours in the  day. She reports a lot of lightheadedness as well.    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 Records from Dr. Amalia Hailey and provided by her mother were reviewed. Seizures started at 60 months of age (10[birth year]1988). She was started on Depakote in 07/1989. She underwent EMU monitoring in [place] in 2007 and had left temporal lobe surgery in [place], [place] that was unsuccessful. VNS was placed in 2017. Main trigger noted for her seizures is a temperature of 99 degrees, whether from illness, physical activity, stress, or menstrual period. She started a CBD schedule in 2020 with her neurologist in [place] and they were able to wean off Lamotrigine in 2021. She continues on Topiramate 200mg  BID and CBD 70mg  BID. CBD is obtained from [facility] in [place].  Seizure semiology has included staring with bilateral hand automatisms followed by generalized tonic posturing, and drop attacks. She will have an aura of grabbing on to something then has loss of consciousness. Her mother notes shakiness and pupils dilate. On her last visit with Dr. Amalia Hailey in 08/2020, they reported her last seizure with loss of consciousness/GTC was in 12/2019, however today report that she had 4 seizures last year with spacing out or behavioral arrest for 15 seconds. She had a seizure yesterday in her sleep occurring on and off from 2:30am to 6am. She almost fell off the bed. She was biting her pillows and teddy bear trying to stay calm.  She could feel her body shaking a little bit for 15-30 seconds, occurring repeatedly and waking her up. She did have a low grade temperature and took Ibuprofen and 2mg  lorazepam, with no further events for the rest of the day. She denies any loss of consciousness with the recent seizures. She has rare body jerks. She denies any olfactory/gustatory hallucinations, focal  numbness/tingling/weakness. She has rare migraines and takes Ibuprofen with good response. She has some swallowing difficulties due to her thyroid issues, and back pain due to scoliosis. No bowel/bladder dysfunction. Sleep is good. Mood is alright. She manages her own medications without issues.She lives with her mother and seizure dog. She does not drive.   Epilepsy Risk Factors:  A paternal aunt had epilepsy in childhood, 2 paternal cousins have epilepsy. She had a febrile convulsion in childhood. Otherwise she had a normal birth with no history of CNS infections such as meningitis/encephalitis, significant traumatic brain injury, neurosurgical procedures  Prior ASMs: Lamotrigine, Sabril, Dilantin, Trileptal, Lyrica, Onfi, Depakote, Zarontin, Felbatol, Neurontin, Tegretol, Phenobarbital    PAST MEDICAL HISTORY: Past Medical History:  Diagnosis Date   Hyperthyroidism    Thyroid cancer ([place])     MEDICATIONS: Current Outpatient Medications on File Prior to Visit  Medication Sig Dispense Refill   Ascorbic Acid (VITAMIN C) 1000 MG tablet Take 1,000 mg by mouth daily.     Biotin 10000 MCG TABS Take by mouth.     calcium carbonate (OSCAL) 1500 (600 Ca) MG TABS tablet Take by mouth daily.     Cholecalciferol 50 MCG (2000 UT) TABS Take by mouth.     [employer] FIBER GUMMY BEARS CHILDREN PO Take by mouth.     cyanocobalamin 1000 MCG tablet Take by mouth.     cyclobenzaprine (FLEXERIL) 10 MG tablet Take 10 mg by mouth 2 (two) times daily as needed.     levothyroxine (SYNTHROID) 75 MCG tablet Take 37.5 mcg by mouth.     LORazepam (ATIVAN) 2 MG tablet Take 2 mg by mouth. As needed for seizures     medroxyPROGESTERone Acetate 150 MG/ML SUSY Inject into the muscle.     Multiple Vitamins-Minerals (MULTIVITAMIN WITH MINERALS) tablet Take 1 tablet by mouth daily.     NON FORMULARY Place 70 mg under the tongue in the morning and at bedtime. CBD oil     Omega-3 Fatty Acids (FISH OIL) 1200 MG CPDR Take 1 capsule  by mouth daily.     topiramate (TOPAMAX) 200 MG tablet Take 1 tablet (200 mg total) by mouth 2 (two) times daily. 180 tablet 3   No current facility-administered medications on file prior to visit.    ALLERGIES: Allergies  Allergen Reactions   Estrogens Other (See Comments)    Other reaction(s): Seizures   Amoxicillin Hives   Latex Hives   Pregabalin Other (See Comments)    Excessive weight gain Excessive weight gain Excessive weight gain     FAMILY HISTORY: No family history on file.  SOCIAL HISTORY: Social History   Socioeconomic History   Marital status: Single    Spouse name: Not on file   Number of children: Not on file   Years of education: Not on file   Highest education level: Not on file  Occupational History   Not on file  Tobacco Use   Smoking status: Never   Smokeless tobacco: Never  Vaping Use   Vaping Use: Never used  Substance and Sexual Activity   Alcohol use: Yes    Comment: rare  Drug use: Never   Sexual activity: Not on file  Other Topics Concern   Not on file  Social History Narrative   Right handed   Lives with mom   Social Determinants of Health   Financial Resource Strain: Not on file  Food Insecurity: Not on file  Transportation Needs: Not on file  Physical Activity: Not on file  Stress: Not on file  Social Connections: Not on file  Intimate Partner Violence: Not on file     PHYSICAL EXAM: Vitals:   06/20/21 1111  BP: 125/90  Pulse: 87  SpO2: 99%   General: No acute distress, anxious Head:  Normocephalic/atraumatic Skin/Extremities: No rash, no edema Neurological Exam: alert and awake. No aphasia or dysarthria. Fund of knowledge is appropriate.  Attention and concentration are reduced.   Cranial nerves: Pupils equal, round. Extraocular movements intact.  No facial asymmetry.  Motor: moves all extremities symmetrically. Gait narrow-based and steady  VNS Therapy Management: Parameters Output Current (mA): 2 Signal  Frequency (Hz): 25 Pulse Width (usec): 500 Signal ON Time (sec): 30 Signal OFF Time (min): 5 Magnet Output Current (mA): 2.25 Magnet ON Time (sec): 60 Magnet Pulse Width (usec): 250 AutoStim Output Current (mA): 2 AutoStim Pulse Width (usec): 250 AutoStim ON Time (sec): 60 Tachycardia Detection : On Heartbeat Detection Sensitivity: 3 Perform Verify Heartbeat Detection: yes Threshold for AutoStim (%): 20% Diagnostics Current Delievered (mA): 2 Lead Impedance: OK Impedence Value (Ohms): 2803 Battery Status Indicator (color): Green (11-25%)   IMPRESSION: This is a 36 yo RH woman with 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 MRI brain did not show any significant intracranial abnormalities, there was an incidental finding of a 5-62mm left paraophthalmic aneurysm. She is being scheduled for diagnostic angiogram and proceeding with endovascular treatment. She has not had any GTCs since 2021, her last focal seizure was in 02/2021. She has been on numerous seizure medications in the past, with best response from combination of Topiramate 200mg  BID and CBD obtained from a pharmacy in [place]. She has prn lorazepam 2mg  for seizure rescue. VNS interrogated today, battery again 11-25%. We will be getting her set up for battery replacement with Dr. Kathyrn Sheriff after she has completed procedure with Dr. Estanislado Pandy. Her anxiety has been significantly increasing over the past few weeks due to the upcoming procedure, her mother needed to reassure and redirect her during the visit. Follow-up in 4 months, call for any changes.    Thank you for allowing me to participate in her care.  Please do not hesitate to call for any questions or concerns.   Ellouise Newer, M.D.   CC: Dr. Sharlet Salina, Dr. Estanislado Pandy

## 2021-06-20 NOTE — Patient Instructions (Signed)
Always good to see you. Continue Topiramate. Let's keep each other updated on the angiogram date, I'll see you the day after to check the VNS. Follow-up in 4-5 months, call for any changes.    Seizure Precautions: 1. If medication has been prescribed for you to prevent seizures, take it exactly as directed.  Do not stop taking the medicine without talking to your doctor first, even if you have not had a seizure in a long time.   2. Avoid activities in which a seizure would cause danger to yourself or to others.  Don't operate dangerous machinery, swim alone, or climb in high or dangerous places, such as on ladders, roofs, or girders.  Do not drive unless your doctor says you may.  3. If you have any warning that you may have a seizure, lay down in a safe place where you can't hurt yourself.    4.  No driving for 6 months from last seizure, as per [facility].   Please refer to the following link on the [place] website for more information: http://www.epilepsyfoundation.org/answerplace/Social/driving/drivingu.cfm   5.  Maintain good sleep hygiene.   6.  Notify your neurology if you are planning pregnancy or if you become pregnant.  7.  Contact your doctor if you have any problems that may be related to the medicine you are taking.  8.  Call 911 and bring the patient back to the ED if:        A.  The seizure lasts longer than 5 minutes.       B.  The patient doesn't awaken shortly after the seizure  C.  The patient has new problems such as difficulty seeing, speaking or moving  D.  The patient was injured during the seizure  E.  The patient has a temperature over 102 F (39C)  F.  The patient vomited and now is having trouble breathing

## 2021-06-21 ENCOUNTER — Other Ambulatory Visit (HOSPITAL_COMMUNITY): Payer: Self-pay | Admitting: Interventional Radiology

## 2021-06-21 ENCOUNTER — Telehealth: Payer: Self-pay | Admitting: Student

## 2021-06-21 DIAGNOSIS — I671 Cerebral aneurysm, nonruptured: Secondary | ICD-10-CM

## 2021-06-21 MED ORDER — TICAGRELOR 90 MG PO TABS: 90.0000 mg | ORAL_TABLET | Freq: Two times a day (BID) | ORAL | 3 refills | Status: DC

## 2021-06-21 MED ORDER — CLOPIDOGREL BISULFATE 75 MG PO TABS: 75.0000 mg | ORAL_TABLET | Freq: Every day | ORAL | 3 refills | Status: DC

## 2021-06-21 NOTE — Telephone Encounter (Signed)
Patient scheduled for cerebral intervention with Dr. Estanislado Pandy on 07/03/21. 90 mg Brilinta BID x 30 days with 3 refills e-prescribed to the patient's pharmacy - [facility] on [address].   I called and spoke with the patient to notify  her of this. She will start taking the Brilinta and 81 mg Aspirin on 06/26/21.  She knows to call our office with any questions.  Soyla Dryer, [place] [phone] 06/21/2021, 9:55 AM

## 2021-06-21 NOTE — Telephone Encounter (Signed)
Patient called and stated she can't afford Brilinta. Brilinta prescription cancelled and plavix e-prescribed instead. Patient will need a P2Y12 level drawn two days prior to her procedure.   Soyla Dryer, [place] [phone] 06/21/2021, 1:13 PM

## 2021-06-28 ENCOUNTER — Other Ambulatory Visit (HOSPITAL_COMMUNITY): Payer: Self-pay

## 2021-06-28 ENCOUNTER — Telehealth (HOSPITAL_COMMUNITY): Payer: Self-pay

## 2021-06-28 ENCOUNTER — Other Ambulatory Visit (HOSPITAL_COMMUNITY)
Admission: RE | Admit: 2021-06-28 | Discharge: 2021-06-28 | Disposition: A | Discharge status: Home / Self Care | Payer: Medicare Other | Source: Ambulatory Visit | Attending: Interventional Radiology | Admitting: Interventional Radiology

## 2021-06-28 DIAGNOSIS — I729 Aneurysm of unspecified site: Secondary | ICD-10-CM | POA: Diagnosis present

## 2021-06-29 ENCOUNTER — Other Ambulatory Visit: Payer: Self-pay | Admitting: Radiology

## 2021-06-30 ENCOUNTER — Encounter (HOSPITAL_COMMUNITY): Payer: Self-pay | Admitting: Interventional Radiology

## 2021-06-30 ENCOUNTER — Other Ambulatory Visit: Payer: Self-pay

## 2021-06-30 ENCOUNTER — Other Ambulatory Visit (HOSPITAL_COMMUNITY): Payer: Self-pay | Admitting: Interventional Radiology

## 2021-06-30 LAB — PLATELET INHIBITION P2Y12

## 2021-06-30 NOTE — Progress Notes (Signed)
PCP - Dr. Sharlet Salina  Cardiologist - Denies  EP- Denies  Endocrine- Denies  Pulm- Denies  Chest x-ray - Denies  EKG - Denies  Stress Test - Denies  ECHO - Denies  Cardiac Cath - Denies  AICD-na PM-na LOOP-na  Nerve Stimulator- Yes- Vagal- pt to bring magnet to disable  Dialysis- Denies  Sleep Study - Denies CPAP - Denies  LABS- 06/28/21 (E): P2Y12- pending 06/30/21: COVID [facility] [facility] site)- pending 07/03/21: CBC w/D, BMP, PT, POC UPreg  ASA- Contiue Plavix- Continue  ERAS- No  HA1C- Denies  Anesthesia- Yes- surgical order- notified  Pt denies having chest pain, sob, or fever during the pre-op phone call. All instructions explained to the pt, with a verbal understanding of the material including: as of today,  stop taking all Aspirin (unless instructed by your doctor) and Other Aspirin containing products, Vitamins, Fish oils, and Herbal medications. Also stop all NSAIDS i.e. Advil, Ibuprofen, Motrin, Aleve, Anaprox, Naproxen, BC, Goody Powders, and all Supplements. Pt also instructed to wear a mask and social distance after being tested for COVID-19. The opportunity to ask questions was provided.    Coronavirus Screening  Have you experienced the following symptoms:  Cough yes/no: No Fever (>100.21F)  yes/no: No Runny nose yes/no: No Sore throat yes/no: No Difficulty breathing/shortness of breath  yes/no: No  Have you or a family member traveled in the last 14 days and where? yes/no: No   If the patient indicates "YES" to the above questions, their PAT will be rescheduled to limit the exposure to others and, the surgeon will be notified. THE PATIENT WILL NEED TO BE ASYMPTOMATIC FOR 14 DAYS.   If the patient is not experiencing any of these symptoms, the PAT nurse will instruct them to NOT bring anyone with them to their appointment since they may have these symptoms or traveled as well.   Please remind your patients and families that hospital visitation  restrictions are in effect and the importance of the restrictions.

## 2021-07-01 LAB — SARS CORONAVIRUS 2 (TAT 6-24 HRS): SARS Coronavirus 2: NEGATIVE

## 2021-07-02 NOTE — Anesthesia Preprocedure Evaluation (Addendum)
Anesthesia Evaluation  Patient identified by MRN, date of birth, ID band Patient awake    Reviewed: Allergy & Precautions, NPO status , Patient's Chart, lab work & pertinent test results  Airway Mallampati: II  TM Distance: >3 FB Neck ROM: Full    Dental no notable dental hx.    Pulmonary neg pulmonary ROS,    Pulmonary exam normal        Cardiovascular negative cardio ROS   Rhythm:Regular Rate:Normal     Neuro/Psych Seizures -,  Anxiety Depression Brain aneurysm    GI/Hepatic negative GI ROS, Neg liver ROS,   Endo/Other  Hypothyroidism   Renal/GU negative Renal ROS  negative genitourinary   Musculoskeletal negative musculoskeletal ROS (+)   Abdominal Normal abdominal exam  (+)   Peds  Hematology negative hematology ROS (+)   Anesthesia Other Findings   Reproductive/Obstetrics                            Anesthesia Physical Anesthesia Plan  ASA: 3  Anesthesia Plan: General   Post-op Pain Management:    Induction: Intravenous  PONV Risk Score and Plan: 3 and Ondansetron, Dexamethasone and Midazolam  Airway Management Planned: Mask and Oral ETT  Additional Equipment: Arterial line  Intra-op Plan:   Post-operative Plan: Extubation in OR  Informed Consent: I have reviewed the patients History and Physical, chart, labs and discussed the procedure including the risks, benefits and alternatives for the proposed anesthesia with the patient or authorized representative who has indicated his/her understanding and acceptance.     Dental advisory given  Plan Discussed with: CRNA  Anesthesia Plan Comments:        Anesthesia Quick Evaluation

## 2021-07-03 ENCOUNTER — Encounter (HOSPITAL_COMMUNITY)
Admission: RE | Disposition: A | Discharge status: Home / Self Care | Payer: Self-pay | Source: Home / Self Care | Attending: Interventional Radiology

## 2021-07-03 ENCOUNTER — Other Ambulatory Visit: Payer: Self-pay

## 2021-07-03 ENCOUNTER — Inpatient Hospital Stay (HOSPITAL_COMMUNITY): Payer: Medicare Other | Admitting: Physician Assistant

## 2021-07-03 ENCOUNTER — Inpatient Hospital Stay (HOSPITAL_COMMUNITY)
Admission: RE | Admit: 2021-07-03 | Discharge: 2021-07-04 | DRG: 027 | Disposition: A | Discharge status: Home / Self Care | Payer: Medicare Other | Attending: Interventional Radiology | Admitting: Interventional Radiology

## 2021-07-03 ENCOUNTER — Inpatient Hospital Stay (HOSPITAL_COMMUNITY)
Admission: RE | Admit: 2021-07-03 | Discharge: 2021-07-03 | Disposition: A | Discharge status: Home / Self Care | Payer: Medicare Other | Source: Ambulatory Visit | Attending: Interventional Radiology | Admitting: Interventional Radiology

## 2021-07-03 ENCOUNTER — Encounter (HOSPITAL_COMMUNITY): Payer: Self-pay | Admitting: Interventional Radiology

## 2021-07-03 DIAGNOSIS — F32A Depression, unspecified: Secondary | ICD-10-CM | POA: Diagnosis present

## 2021-07-03 DIAGNOSIS — Z888 Allergy status to other drugs, medicaments and biological substances status: Secondary | ICD-10-CM

## 2021-07-03 DIAGNOSIS — Z79890 Hormone replacement therapy (postmenopausal): Secondary | ICD-10-CM | POA: Diagnosis not present

## 2021-07-03 DIAGNOSIS — G40909 Epilepsy, unspecified, not intractable, without status epilepticus: Secondary | ICD-10-CM | POA: Diagnosis present

## 2021-07-03 DIAGNOSIS — Z7902 Long term (current) use of antithrombotics/antiplatelets: Secondary | ICD-10-CM

## 2021-07-03 DIAGNOSIS — E0590 Thyrotoxicosis, unspecified without thyrotoxic crisis or storm: Secondary | ICD-10-CM | POA: Diagnosis present

## 2021-07-03 DIAGNOSIS — R413 Other amnesia: Secondary | ICD-10-CM | POA: Diagnosis present

## 2021-07-03 DIAGNOSIS — I671 Cerebral aneurysm, nonruptured: Secondary | ICD-10-CM

## 2021-07-03 DIAGNOSIS — Z7982 Long term (current) use of aspirin: Secondary | ICD-10-CM | POA: Diagnosis not present

## 2021-07-03 DIAGNOSIS — Z91040 Latex allergy status: Secondary | ICD-10-CM | POA: Diagnosis not present

## 2021-07-03 DIAGNOSIS — Z85850 Personal history of malignant neoplasm of thyroid: Secondary | ICD-10-CM | POA: Diagnosis not present

## 2021-07-03 HISTORY — PX: IR 3D INDEPENDENT WKST: IMG2385

## 2021-07-03 HISTORY — PX: IR ANGIO INTRA EXTRACRAN SEL INTERNAL CAROTID BILAT MOD SED: IMG5363

## 2021-07-03 HISTORY — PX: RADIOLOGY WITH ANESTHESIA: SHX6223

## 2021-07-03 HISTORY — PX: IR TRANSCATH/EMBOLIZ: IMG695

## 2021-07-03 HISTORY — PX: IR ANGIO VERTEBRAL SEL VERTEBRAL UNI R MOD SED: IMG5368

## 2021-07-03 HISTORY — PX: IR CT HEAD LTD: IMG2386

## 2021-07-03 HISTORY — DX: Epilepsy, unspecified, not intractable, without status epilepticus: G40.909

## 2021-07-03 HISTORY — DX: Depression, unspecified: F32.A

## 2021-07-03 HISTORY — DX: Other amnesia: R41.3

## 2021-07-03 LAB — ABO/RH: ABO/RH(D): A POS

## 2021-07-03 LAB — BASIC METABOLIC PANEL
Anion gap: 10 (ref 5–15)
BUN: 8 mg/dL (ref 6–20)
CO2: 16 mmol/L — ABNORMAL LOW (ref 22–32)
Calcium: 8.5 mg/dL — ABNORMAL LOW (ref 8.9–10.3)
Chloride: 112 mmol/L — ABNORMAL HIGH (ref 98–111)
Creatinine, Ser: 0.85 mg/dL (ref 0.44–1.00)
GFR, Estimated: >60 mL/min (ref >60)
Glucose, Bld: 93 mg/dL (ref 70–99)
Potassium: 3.5 mmol/L (ref 3.5–5.1)
Sodium: 138 mmol/L (ref 135–145)

## 2021-07-03 LAB — POCT I-STAT, CHEM 8
BUN: 8 mg/dL (ref 6–20)
Calcium, Ion: 1.16 mmol/L (ref 1.15–1.40)
Chloride: 113 mmol/L — ABNORMAL HIGH (ref 98–111)
Creatinine, Ser: 0.8 mg/dL (ref 0.44–1.00)
Glucose, Bld: 98 mg/dL (ref 70–99)
HCT: 40 % (ref 36.0–46.0)
Hemoglobin: 13.6 g/dL (ref 12.0–15.0)
Potassium: 3.6 mmol/L (ref 3.5–5.1)
Sodium: 142 mmol/L (ref 135–145)
TCO2: 18 mmol/L — ABNORMAL LOW (ref 22–32)

## 2021-07-03 LAB — POCT ACTIVATED CLOTTING TIME
Activated Clotting Time: 185 seconds
Activated Clotting Time: 221 seconds
Activated Clotting Time: 221 seconds

## 2021-07-03 LAB — PROTIME-INR
INR: 1 (ref 0.8–1.2)
Prothrombin Time: 13.5 seconds (ref 11.4–15.2)

## 2021-07-03 LAB — CBC WITH DIFFERENTIAL/PLATELET
Abs Immature Granulocytes: 0.02 10*3/uL (ref 0.00–0.07)
Basophils Absolute: 0.1 10*3/uL (ref 0.0–0.1)
Basophils Relative: 1 %
Eosinophils Absolute: 0.1 10*3/uL (ref 0.0–0.5)
Eosinophils Relative: 1 %
HCT: 39.8 % (ref 36.0–46.0)
Hemoglobin: 13.5 g/dL (ref 12.0–15.0)
Immature Granulocytes: 0 %
Lymphocytes Relative: 31 %
Lymphs Abs: 1.7 10*3/uL (ref 0.7–4.0)
MCH: 31.4 pg (ref 26.0–34.0)
MCHC: 33.9 g/dL (ref 30.0–36.0)
MCV: 92.6 fL (ref 80.0–100.0)
Monocytes Absolute: 0.3 10*3/uL (ref 0.1–1.0)
Monocytes Relative: 6 %
Neutro Abs: 3.3 10*3/uL (ref 1.7–7.7)
Neutrophils Relative %: 61 %
Platelets: 207 10*3/uL (ref 150–400)
RBC: 4.3 MIL/uL (ref 3.87–5.11)
RDW: 12.5 % (ref 11.5–15.5)
WBC: 5.4 10*3/uL (ref 4.0–10.5)
nRBC: 0 % (ref 0.0–0.2)

## 2021-07-03 LAB — TYPE AND SCREEN
ABO/RH(D): A POS
Antibody Screen: NEGATIVE

## 2021-07-03 LAB — POCT PREGNANCY, URINE: Preg Test, Ur: NEGATIVE

## 2021-07-03 LAB — HEPARIN LEVEL (UNFRACTIONATED): Heparin Unfractionated: <0.1 IU/mL — ABNORMAL LOW (ref 0.30–0.70)

## 2021-07-03 IMAGING — XA IR TRANSCATH EMBOLIZATION
2 of 3 series · 9 of 24 positions shown · IV contrast (IODINE)
Comparison: MRI MRA the brain [DATE].

CLINICAL DATA: Patient with a history of intractable seizures
emanating in the left temporal lobe. Status post partial resection
with no significant change and seizure frequency. Discovery of an
intracranial aneurysm in the left paraophthalmic region on workup.

EXAM:
TRANSCATHETER THERAPY EMBOLIZATION
TECHNIQUE: Informed written consent was obtained from the patient after a
thorough discussion of the procedural risks, benefits and
alternatives. All questions were addressed. Maximal Sterile Barrier
Technique was utilized including caps, mask, sterile gowns, sterile
gloves, sterile drape, hand hygiene and skin antiseptic. A timeout
was performed prior to the initiation of the procedure.

[Series 33: <mpr range>1 · axial · 5.0mm · 0.33mm/px · 1 of 25 slices shown]
[im 1/25  full-range]
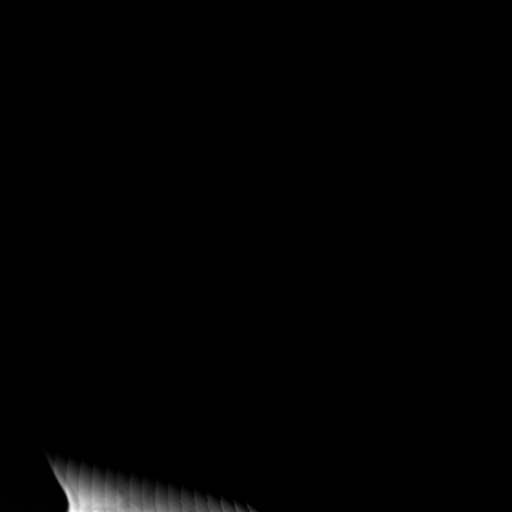

[Series 300: dr. (person_name). · 8 of 520 slices shown]
[im 50/520]
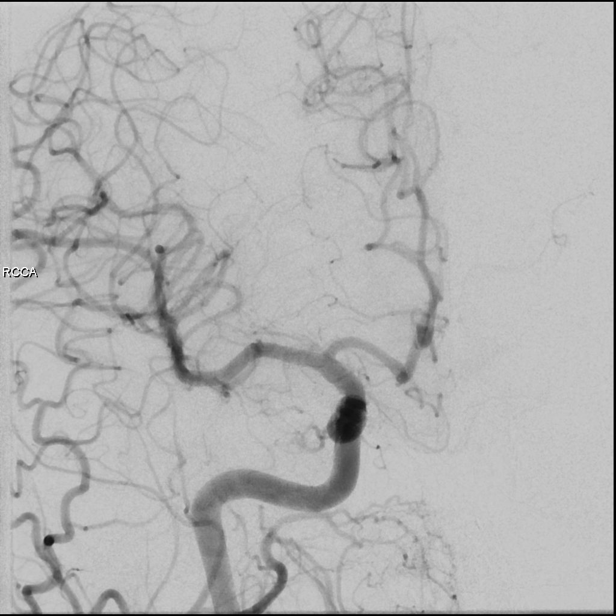
[im 99/520]
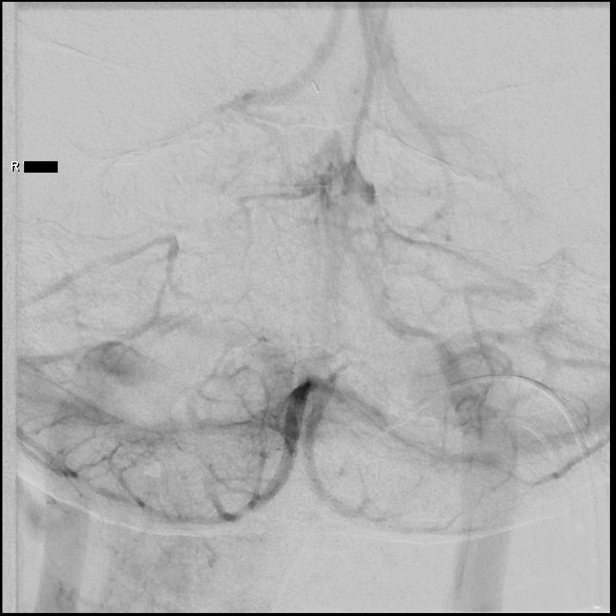
[im 174/520]
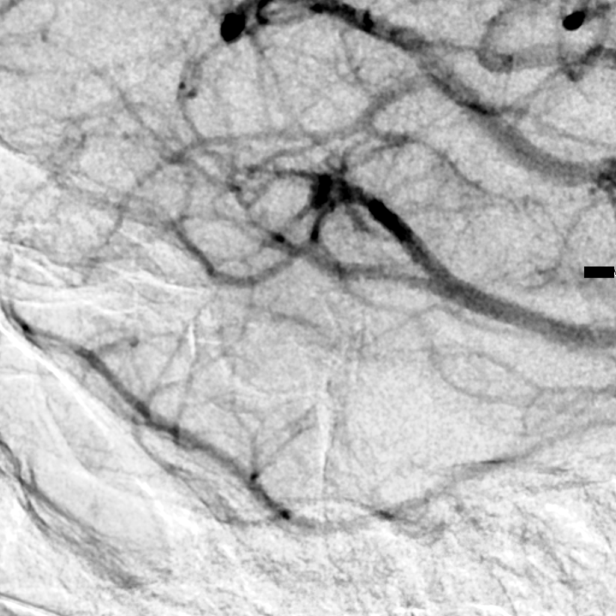
[im 248/520]
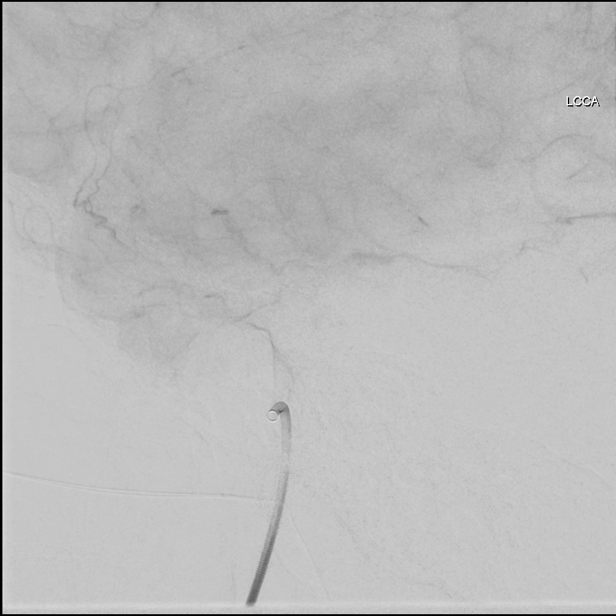
[im 297/520]
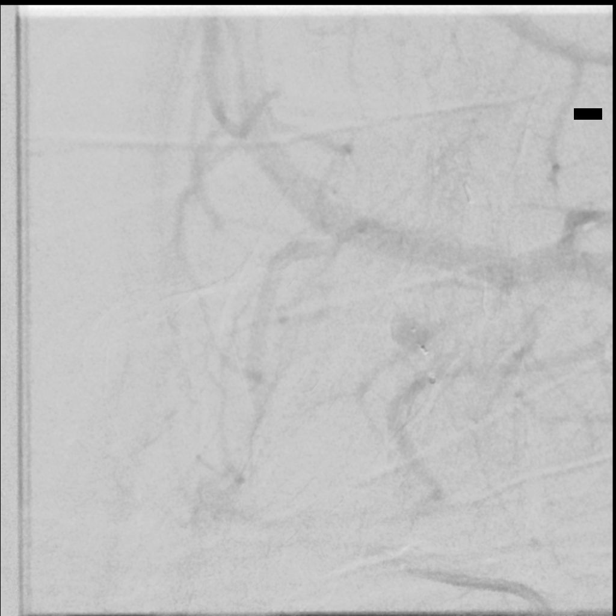
[im 371/520]
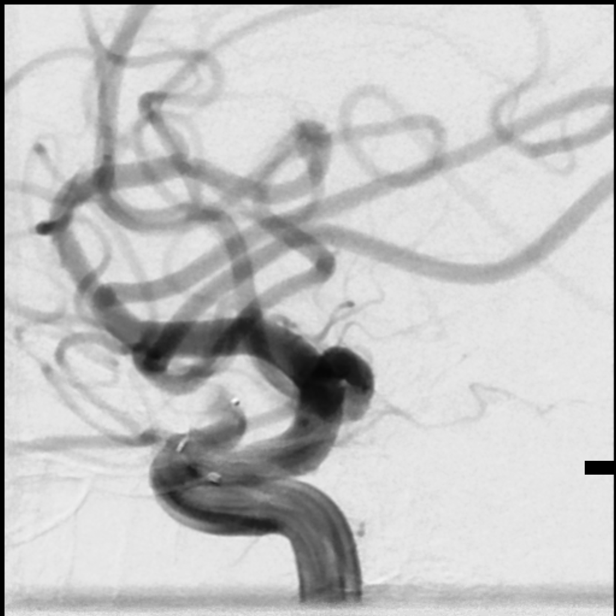
[im 445/520]
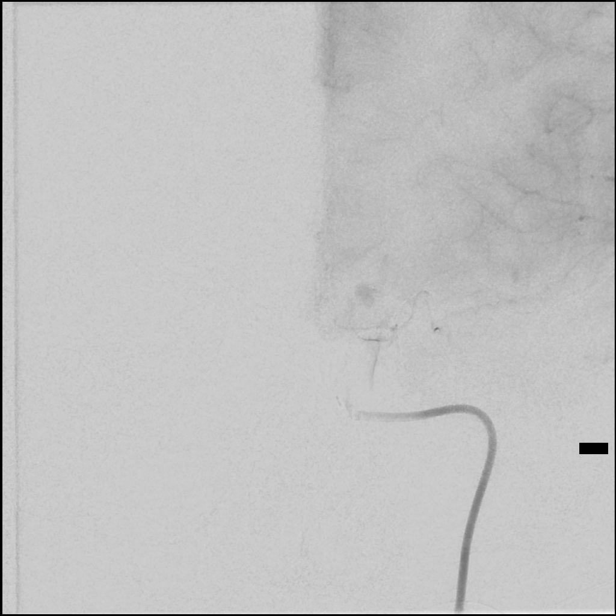
[im 495/520]
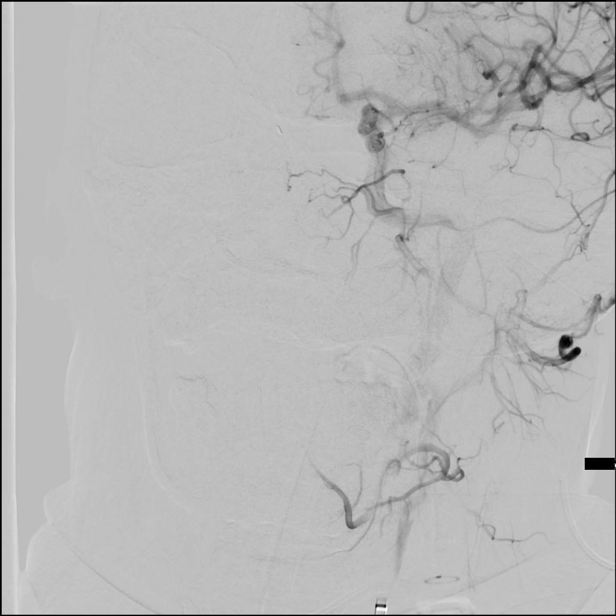

[9 of 24 positions shown; findings below may reference images not displayed]

MEDICATIONS:
Heparin 3,000 units IV. Ancef 2 g IV antibiotic was administered
within 1 hour of the procedure.

ANESTHESIA/SEDATION:
General anesthesia.

CONTRAST:  Omnipaque 300 approximately 150 cc.

FLUOROSCOPY TIME:  Fluoroscopy Time: 31 minutes 42 seconds ([C9]
mGy).

COMPLICATIONS:
None immediate.
The right groin was prepped and draped in the usual sterile fashion.
Thereafter using modified Seldinger technique, transfemoral access
into the right common femoral artery was obtained without
difficulty. Over a 0.035 inch guidewire, an 8 French Pinnacle 25 cm
sheath was inserted. Through this, and also over 0.035 inch
guidewire, a 5 French JB 1 catheter was advanced to the aortic arch
region and selectively positioned in the right common carotid
artery, the right vertebral artery and the left common carotid
artery. A 3D rotational arteriogram was also performed of the left
intracranial circulation via the left common carotid artery. 3D
reformations were then performed on a separate workstation.
FINDINGS: Innominate arteriogram demonstrates the proximal right common
carotid artery and the right subclavian artery to be widely patent.

The origin of the right vertebral artery is widely patent. The
vessel is seen to opacify normally to the cranial skull base. Wide
patency is seen of the right posterior-inferior cerebellar artery
and the right vertebrobasilar junction.

The basilar artery, the posterior cerebral arteries, the superior
cerebellar arteries and the anterior-inferior cerebellar arteries
opacify into the capillary and venous phases.

Retrograde opacification of the left anterior-inferior cerebellar
artery/posteroinferior cerebellar artery is seen from the
right-sided injection.

The right common carotid arteriogram demonstrates the right external
carotid artery and its major branches to be widely patent.

The right internal carotid artery at the bulb to the cranial skull
base is widely patent with moderate tortuosity in its mid [DATE]
segment.

The petrous, the cavernous and the supraclinoid segments are widely
patent.

Small infundibulum is seen in the right posterior communicating
artery region. The right middle and the right anterior cerebral
artery opacify into the capillary and venous phases. Transient
cross-filling via the anterior communicating artery of the left
anterior cerebral artery A2 segment and distally is noted.

The left common carotid arteriogram demonstrates the left external
carotid artery and its major branches to be widely patent. The left
internal carotid artery at the bulb to the cranial skull base is
widely patent. There is a U-shaped configuration of the left
internal carotid artery at the junction of the middle and the
proximal [DATE]. More distally, the vessel is seen to opacify to the
cranial skull base. The petrous, the cavernous and the supraclinoid
segments demonstrate wide patency.

Left middle cerebral artery and the left anterior cerebral artery
opacify into the capillary and venous phases. Arising just distal to
the origin of the left ophthalmic artery is a saccular aneurysm with
a wide neck measuring approximately 6 mm x 5 mm with a neck of
approximately 3-4 mm. A 3D rotational arteriogram was then performed
which confirms the presence of an approximately 6 mm x 4.8 mm wide
neck aneurysm.

ENDOVASCULAR TREATMENT OF THE LEFT INTERNAL CAROTID ARTERY SUPRA
OPHTHALMIC ANEURYSM WITH THE WEB SL INTRA SACCULAR FLOW DISRUPTER

The diagnostic JB 1 catheter in the left common carotid artery was
then exchanged over a 0.035 inch 300 cm Rosen exchange guidewire for
a 95 cm 8 French Neuron Max sheath. The guidewire was removed. Good
aspiration obtained from the hub of the Neuron Max sheath into the
left common carotid artery. An arteriogram was then perfor[REDACTED]ed extra cranially and intracranially.

Over a 0.035 inch Roadrunner guidewire, a 115 cm 5 French Catalyst
guide catheter was then advanced to the petrous segment of the left
internal carotid artery. The guidewire was removed. Good aspiration
obtained from the hub of the 5 French Catalyst guide catheter. A
biplane DSA controlled demonstrated no evidence of dissections,
spasms, or of intraluminal filling defects. The Neuron Max sheath
was advanced into the proximal [DATE] of the left internal carotid
artery.

Using biplane DSA roadmap, a 17 Via 2 tip microcatheter was then
advanced over a 0.014 inch standard Synchro micro guidewire to the
caval cavernous segment of the left internal carotid artery.

Using a torque device, access into the left supra ophthalmic
aneurysm was obtained with the micro guidewire followed by the
microcatheter. Micro guidewire was then removed from the intra
aneurysmal microcatheter to ensure no certain forward motion
movement of the microcatheter. None was observed.

Good aspiration was obtained from the hub of the Via 17
microcatheter. This was then connected to continuous heparinized
saline infusion.

After assessment and careful examination of the lateral and AP DSA
studies, and of the 3D rotational arteriogram it was decided to
place a 6-3 SL Web device into the aneurysm.

The device was then flushed and cleared of any air bubbles submerged
in heparinized saline infusion.

It was then captured into the delivery microcatheter.

Using biplane roadmap technique and constant fluoroscopic guidance
and constant heparinized saline infusion, the device was slowly
advanced under fluoroscopic guidance to the distal end of the
microcatheter. Distal and proximal markers were then aligned.

Under constant biplane fluoroscopy, with the distal marker of the
device in the distal [DATE] of the aneurysm, the microcatheter was
unsheathed until a flower configuration was obtained of the device
within the aneurysm.

With slow steady manipulation of the device, and the microcatheter
device the aneurysm configuration.

An arteriogram was then performed which demonstrated excellent
apposition in the AP and lateral projections with the proximal
marker in the neck of the aneurysm. With the device maintained in
its position, the microcatheter tip was then retrieved until space
was created for device to be detached electrically. Under
fluoroscopic visualization, the device was then eventually noted be
deployed. Microcatheter was then gently advanced to capture the
delivery wire. Combination was then removed under constant
fluoroscopic observation.

Control arteriograms were then performed through the 5 French
Catalyst guide catheter which continued to demonstrate no change in
the configuration of the device within the aneurysm. The proximal
marker continued to be devoid of any visible irregularity.

Nevertheless, the patient was given a total of 4.5 mg of intra
arterial Integrilin and also ensure no platelet aggregation at the
proximal marker.

A final control arteriogram performed following removal of the 5
French Catalyst guide catheter, and from the Neuron Max sheath in
the left common carotid artery continued to demonstrate stasis
within the aneurysm with no intra luminal filling defects or
occlusions seen in the left anterior and left middle cerebral artery
distributions.

The Neuron Max sheath was removed. The 8 French sheath was removed
with successful hemostasis obtained with an 8 French Angio-Seal
closure device. Distal pulses remained palpable in both feet
unchanged.

CT of the brain demonstrated no evidence of intracranial hemorrhage
or mass effect.

Patient's general anesthesia was then reversed and the patient was
then extubated without difficulty. Upon recovery, the patient denied
any headaches, nausea, vomiting, or of visual or speech
difficulties.

She was able to move all her 4 extremities equally.

She was then transferred to the PACU and then transferred to the
neuro ICU. Late in the evening, the patient was fully awake, alert,
oriented to time, place, space and devoid of any neurological
symptoms. The right groin appeared soft. Distal pulses were noted to
be present. Overnight she was left on IV heparin without any event.

The following morning, the heparin was stopped and patient was
switched to aspirin 81 mg a day, and Plavix 75 mg a day.
Neurologically she was intact.

She was then ambulated with help. She was encouraged to increase her
p.o. intake.

She was then discharged home under the care of her mother. She was
advised to refrain from stooping, bending or lifting weights above
10 pounds for 2 [address] not drive. She was reminded to
continue maintaining adequate hydration with drinking water. Also it
was impressed that the patient continue taking her home meds, and
also the aspirin and Plavix without fail.
IMPRESSION: Status post endovascular embolization of left supra ophthalmic
intracranial aneurysm with a 6-3 Web SL intra saccular flow
disrupter device with stasis.

PLAN:
Follow-up in clinic 2 weeks post discharge.

## 2021-07-03 SURGERY — IR WITH ANESTHESIA
Anesthesia: General

## 2021-07-03 MED ORDER — HEPARIN (PORCINE) 25000 UT/250ML-% IV SOLN
INTRAVENOUS | Status: AC
  Administered 2021-07-03: 500 [IU]/h via INTRAVENOUS
  Filled 2021-07-03: qty 250

## 2021-07-03 MED ORDER — NON FORMULARY
70.0000 mg | Freq: Every day | Status: DC
Start: 2021-07-03 — End: 2021-07-03

## 2021-07-03 MED ORDER — CLOPIDOGREL BISULFATE 75 MG PO TABS
75.0000 mg | ORAL_TABLET | Freq: Once | ORAL | Status: AC
  Administered 2021-07-03: 75 mg via ORAL
  Filled 2021-07-03: qty 1

## 2021-07-03 MED ORDER — LORAZEPAM 2 MG PO TABS: 2.0000 mg | ORAL_TABLET | Freq: Every day | ORAL | Status: DC | PRN

## 2021-07-03 MED ORDER — LEVOTHYROXINE SODIUM 75 MCG PO TABS: 37.5000 ug | ORAL_TABLET | Freq: Every day | ORAL | Status: DC

## 2021-07-03 MED ORDER — CHLORHEXIDINE GLUCONATE 0.12 % MT SOLN
OROMUCOSAL | Status: AC
  Administered 2021-07-03: 15 mL via OROMUCOSAL
  Filled 2021-07-03: qty 15

## 2021-07-03 MED ORDER — CHLORHEXIDINE GLUCONATE 0.12 % MT SOLN: 15.0000 mL | Freq: Once | OROMUCOSAL | Status: AC

## 2021-07-03 MED ORDER — FENTANYL CITRATE (PF) 100 MCG/2ML IJ SOLN: 25.0000 ug | INTRAMUSCULAR | Status: DC | PRN

## 2021-07-03 MED ORDER — ONDANSETRON HCL 4 MG/2ML IJ SOLN
INTRAMUSCULAR | Status: DC | PRN
  Administered 2021-07-03: 4 mg via INTRAVENOUS

## 2021-07-03 MED ORDER — PROPOFOL 10 MG/ML IV BOLUS
INTRAVENOUS | Status: DC | PRN
  Administered 2021-07-03: 170 mg via INTRAVENOUS

## 2021-07-03 MED ORDER — NITROGLYCERIN 1 MG/10 ML FOR IR/CATH LAB
INTRA_ARTERIAL | Status: AC
  Filled 2021-07-03: qty 10

## 2021-07-03 MED ORDER — MIDAZOLAM HCL 2 MG/2ML IJ SOLN
INTRAMUSCULAR | Status: DC | PRN
Start: 2021-07-03 — End: 2021-07-03
  Administered 2021-07-03 (×2): 2 mg via INTRAVENOUS

## 2021-07-03 MED ORDER — GLYCOPYRROLATE 0.2 MG/ML IJ SOLN
INTRAMUSCULAR | Status: DC | PRN
  Administered 2021-07-03: .2 mg via INTRAVENOUS

## 2021-07-03 MED ORDER — CLOPIDOGREL BISULFATE 75 MG PO TABS: 75.0000 mg | ORAL_TABLET | ORAL | Status: DC

## 2021-07-03 MED ORDER — LIDOCAINE HCL 1 % IJ SOLN
INTRAMUSCULAR | Status: AC
  Filled 2021-07-03: qty 20

## 2021-07-03 MED ORDER — DEXMEDETOMIDINE (PRECEDEX) IN NS 20 MCG/5ML (4 MCG/ML) IV SYRINGE
PREFILLED_SYRINGE | INTRAVENOUS | Status: DC | PRN
  Administered 2021-07-03 (×2): 8 ug via INTRAVENOUS

## 2021-07-03 MED ORDER — LORAZEPAM 1 MG PO TABS
2.0000 mg | ORAL_TABLET | Freq: Every day | ORAL | Status: DC | PRN
  Administered 2021-07-03: 2 mg via ORAL
  Filled 2021-07-03: qty 2

## 2021-07-03 MED ORDER — HEPARIN (PORCINE) 25000 UT/250ML-% IV SOLN: 500.0000 [IU]/h | INTRAVENOUS | Status: DC

## 2021-07-03 MED ORDER — ASPIRIN 81 MG PO CHEW: 81.0000 mg | CHEWABLE_TABLET | Freq: Every day | ORAL | Status: DC

## 2021-07-03 MED ORDER — ACETAMINOPHEN 10 MG/ML IV SOLN: 1000.0000 mg | Freq: Once | INTRAVENOUS | Status: DC | PRN

## 2021-07-03 MED ORDER — ACETAMINOPHEN 650 MG RE SUPP: 650.0000 mg | RECTAL | Status: DC | PRN

## 2021-07-03 MED ORDER — HEPARIN SODIUM (PORCINE) 1000 UNIT/ML IJ SOLN
INTRAMUSCULAR | Status: DC | PRN
  Administered 2021-07-03: 1000 [IU] via INTRAVENOUS
  Administered 2021-07-03: 3000 [IU] via INTRAVENOUS

## 2021-07-03 MED ORDER — VANCOMYCIN HCL 1000 MG IV SOLR: 1000.0000 mg | INTRAVENOUS | Status: DC

## 2021-07-03 MED ORDER — IOHEXOL 300 MG/ML  SOLN
100.0000 mL | Freq: Once | INTRAMUSCULAR | Status: AC | PRN
  Administered 2021-07-03: 60 mL via INTRATHECAL

## 2021-07-03 MED ORDER — LEVOTHYROXINE SODIUM 75 MCG PO TABS
37.5000 ug | ORAL_TABLET | Freq: Every day | ORAL | Status: DC
  Administered 2021-07-04: 37.5 ug via ORAL
  Filled 2021-07-03: qty 1

## 2021-07-03 MED ORDER — SODIUM CHLORIDE 0.9 % IV SOLN: INTRAVENOUS | Status: DC

## 2021-07-03 MED ORDER — ACETAMINOPHEN 325 MG PO TABS: 650.0000 mg | ORAL_TABLET | ORAL | Status: DC | PRN

## 2021-07-03 MED ORDER — LIDOCAINE 2% (20 MG/ML) 5 ML SYRINGE
INTRAMUSCULAR | Status: DC | PRN
Start: 2021-07-03 — End: 2021-07-03
  Administered 2021-07-03: 60 mg via INTRAVENOUS

## 2021-07-03 MED ORDER — TOPIRAMATE 100 MG PO TABS
200.0000 mg | ORAL_TABLET | Freq: Two times a day (BID) | ORAL | Status: DC
  Administered 2021-07-03 – 2021-07-04 (×2): 200 mg via ORAL
  Filled 2021-07-03 (×2): qty 2

## 2021-07-03 MED ORDER — CHLORHEXIDINE GLUCONATE CLOTH 2 % EX PADS
6.0000 | MEDICATED_PAD | Freq: Every day | CUTANEOUS | Status: DC
Start: 2021-07-03 — End: 2021-07-04
  Administered 2021-07-03: 6 via TOPICAL

## 2021-07-03 MED ORDER — CLEVIDIPINE BUTYRATE 0.5 MG/ML IV EMUL
0.0000 mg/h | INTRAVENOUS | Status: DC
  Administered 2021-07-03: 2 mg/h via INTRAVENOUS
  Administered 2021-07-03: 12 mg/h via INTRAVENOUS
  Administered 2021-07-03: 10 mg/h via INTRAVENOUS
  Administered 2021-07-03: 8 mg/h via INTRAVENOUS
  Administered 2021-07-04: 6 mg/h via INTRAVENOUS
  Filled 2021-07-03 (×4): qty 50

## 2021-07-03 MED ORDER — ASPIRIN EC 325 MG PO TBEC: 325.0000 mg | DELAYED_RELEASE_TABLET | ORAL | Status: DC

## 2021-07-03 MED ORDER — ACETAMINOPHEN 160 MG/5ML PO SOLN: 650.0000 mg | ORAL | Status: DC | PRN

## 2021-07-03 MED ORDER — EPTIFIBATIDE 20 MG/10ML IV SOLN
INTRAVENOUS | Status: AC | PRN
  Administered 2021-07-03 (×3): 1.5 mg via INTRA_ARTERIAL

## 2021-07-03 MED ORDER — VANCOMYCIN HCL 1500 MG/300ML IV SOLN
1500.0000 mg | Freq: Once | INTRAVENOUS | Status: AC
  Administered 2021-07-03: 1500 mg via INTRAVENOUS
  Filled 2021-07-03 (×3): qty 300

## 2021-07-03 MED ORDER — CLOPIDOGREL BISULFATE 75 MG PO TABS
75.0000 mg | ORAL_TABLET | Freq: Every day | ORAL | Status: DC
  Administered 2021-07-04: 75 mg via ORAL
  Filled 2021-07-03: qty 1

## 2021-07-03 MED ORDER — NIMODIPINE 30 MG PO CAPS
0.0000 mg | ORAL_CAPSULE | ORAL | Status: AC
  Administered 2021-07-03: 60 mg via ORAL
  Filled 2021-07-03: qty 2

## 2021-07-03 MED ORDER — CLOPIDOGREL BISULFATE 75 MG PO TABS: 75.0000 mg | ORAL_TABLET | Freq: Every day | ORAL | Status: DC

## 2021-07-03 MED ORDER — LORAZEPAM 2 MG/ML IJ SOLN: 1.0000 mg | INTRAMUSCULAR | Status: DC | PRN

## 2021-07-03 MED ORDER — IOHEXOL 300 MG/ML  SOLN
100.0000 mL | Freq: Once | INTRAMUSCULAR | Status: AC | PRN
  Administered 2021-07-03: 70 mL via INTRA_ARTERIAL

## 2021-07-03 MED ORDER — TOPIRAMATE 100 MG PO TABS
200.0000 mg | ORAL_TABLET | Freq: Two times a day (BID) | ORAL | Status: DC
Start: 2021-07-03 — End: 2021-07-03

## 2021-07-03 MED ORDER — DEXAMETHASONE SODIUM PHOSPHATE 10 MG/ML IJ SOLN
INTRAMUSCULAR | Status: DC | PRN
  Administered 2021-07-03: 10 mg via INTRAVENOUS

## 2021-07-03 MED ORDER — FENTANYL CITRATE (PF) 100 MCG/2ML IJ SOLN
INTRAMUSCULAR | Status: DC | PRN
  Administered 2021-07-03 (×2): 100 ug via INTRAVENOUS

## 2021-07-03 MED ORDER — PHENYLEPHRINE HCL-NACL 20-0.9 MG/250ML-% IV SOLN
INTRAVENOUS | Status: DC | PRN
Start: 2021-07-03 — End: 2021-07-03
  Administered 2021-07-03: 30 ug/min via INTRAVENOUS

## 2021-07-03 MED ORDER — ROCURONIUM BROMIDE 10 MG/ML (PF) SYRINGE
PREFILLED_SYRINGE | INTRAVENOUS | Status: DC | PRN
  Administered 2021-07-03: 20 mg via INTRAVENOUS
  Administered 2021-07-03: 30 mg via INTRAVENOUS
  Administered 2021-07-03: 50 mg via INTRAVENOUS
  Administered 2021-07-03: 20 mg via INTRAVENOUS

## 2021-07-03 MED ORDER — AMISULPRIDE (ANTIEMETIC) 5 MG/2ML IV SOLN: 10.0000 mg | Freq: Once | INTRAVENOUS | Status: DC | PRN

## 2021-07-03 MED ORDER — HEPARIN (PORCINE) 25000 UT/250ML-% IV SOLN
650.0000 [IU]/h | INTRAVENOUS | Status: AC
  Administered 2021-07-04: 650 [IU]/h via INTRAVENOUS

## 2021-07-03 MED ORDER — SUGAMMADEX SODIUM 200 MG/2ML IV SOLN
INTRAVENOUS | Status: DC | PRN
  Administered 2021-07-03: 200 mg via INTRAVENOUS

## 2021-07-03 MED ORDER — LACTATED RINGERS IV SOLN: INTRAVENOUS | Status: DC | PRN

## 2021-07-03 MED ORDER — EPTIFIBATIDE 20 MG/10ML IV SOLN
INTRAVENOUS | Status: AC
  Filled 2021-07-03: qty 10

## 2021-07-03 MED ORDER — ASPIRIN 81 MG PO CHEW
81.0000 mg | CHEWABLE_TABLET | Freq: Every day | ORAL | Status: DC
  Administered 2021-07-04: 81 mg via ORAL
  Filled 2021-07-03: qty 1

## 2021-07-03 MED ORDER — IOHEXOL 300 MG/ML  SOLN
100.0000 mL | Freq: Once | INTRAMUSCULAR | Status: AC | PRN
  Administered 2021-07-03: 23 mL via INTRA_ARTERIAL

## 2021-07-03 NOTE — H&P (Addendum)
Chief Complaint: Patient was seen in consultation today for cerebral arteriogram with possible embolization of left paraophthalmic artery aneurysm at the request of Dr Delice Lesch  Supervising Physician: Luanne Bras  Patient Status: [facility] - Out-pt  History of Present Illness: Sandra Collins is a 36 y.o. 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  MRI:  IMPRESSION: Confirmed paraophthalmic left ICA aneurysm, measuring 6 x 5 mm. Neuro-interventional consultation is recommended. No intracranial large vessel occlusion or proximal high-grade arterial stenosis.  Referred to Dr Estanislado Pandy for evaluation and management Was seen in consultation 06/09/21 As part of her evaluation of her seizures and previous brain surgery, patient underwent MRI of the brain in August of 2022. The suspicion of a left paraophthalmic artery aneurysm was raised. A follow-up MRA of the brain confirmed the presence of an approximately 6 mm x5 mm mildly enlarged left paraophthalmic artery aneurysm projecting superiorly and slightly medially. Management considerations were those of eliminating the brain aneurysm from circulation and in order to avoid and prevent growth and/or rupture. The endovascular option was reviewed. The endovascular possibly were those of primary coiling, versus stent assisted coiling versus treatment with a flow diverter, or an intra saccular flow disruption device such as WEB. The risk of endovascular treatment would be 1% chance of a complication such as thrombotic stroke, intraprocedural rupture with remote possibility of death. Also mentioned was remote possibility of a postprocedural delayed intracranial hemorrhage.  Scheduled for procedure today Pt is asymptomatic Denies headaches; N/V Denies vision or speech  changes  Has been taking ASA 81 and Plavix 75 daily (ASA 325 this am) [id number] on 06/28/21 Dr Estanislado Pandy has ordered additional Plavix 75 mg today  Pt has VNS seizure device in place (Vagus nerve stimulation device). Mother has "magnet" to place over device while in surgery for aneurysm. Will remove magnet when finished This has all been discussed with Dr Delice Lesch and Dr Estanislado Pandy  Past Medical History:  Diagnosis Date   Depression    Epilepsy ([place])    Hyperthyroidism    Short-term memory loss    Caused by seizures   Thyroid cancer [facility])     Past Surgical History:  Procedure Laterality Date   BRAIN SURGERY  05/03/2006   IMPLANTATION VAGAL NERVE STIMULATOR  09/28/2015   IR RADIOLOGIST EVAL & MGMT  06/12/2021   THYROID SURGERY     cancer    Allergies: Estrogens, Amoxicillin, Latex, Metronidazole, and Pregabalin  Medications: Prior to Admission medications   Medication Sig Start Date End Date Taking? Authorizing Provider  Ascorbic Acid (VITAMIN C) 1000 MG tablet Take 1,000 mg by mouth daily.   Yes [provider]  aspirin EC 81 MG tablet Take 81 mg by mouth daily. Swallow whole.   Yes [provider]  b complex vitamins capsule Take 1 capsule by mouth daily.   Yes [provider]  Biotin 10000 MCG TABS Take 10,000 mcg by mouth daily.   Yes [provider]  Calcium Carb-Cholecalciferol (CALCIUM 600/VITAMIN D PO) Take 1 tablet by mouth daily.   Yes [provider]  cholecalciferol (VITAMIN D3) 25 MCG (1000 UNIT) tablet Take 1,000 Units by mouth daily.   Yes [provider]  clopidogrel (PLAVIX) 75 MG tablet Take 1 tablet (75 mg total) by mouth daily. 06/21/21 10/19/21 Yes Theresa Duty, NP  [employer] GUMMY  BEARS CHILDREN PO Take 2 each by mouth daily as needed (regularity).   Yes [provider]  ibuprofen (ADVIL) 200 MG tablet Take 400 mg by mouth every 6 (six) hours as needed for headache.   Yes [provider]  levothyroxine (SYNTHROID) 75 MCG tablet Take 37.5 mcg by mouth daily before breakfast. 05/27/20  Yes [provider]  LORazepam (ATIVAN) 2 MG tablet Take 2 mg by mouth daily as needed for seizure.   Yes [provider]  medroxyPROGESTERone (DEPO-PROVERA) 150 MG/ML injection Inject 150 mg into the muscle every 3 (three) months. 06/22/20  Yes [provider]  Misc Natural Products (T-RELIEF CBD+13 SL) Place under the tongue.   Yes [provider]  Multiple Vitamins-Minerals (MULTIVITAMIN WITH MINERALS) tablet Take 1 tablet by mouth daily.   Yes [provider]  NON FORMULARY Place 70 mg under the tongue in the morning and at bedtime. CBD oil   Yes [provider]  Omega-3 Fatty Acids (FISH OIL) 1200 MG CPDR Take 1,200 mg by mouth daily. 02/23/20  Yes [provider]  topiramate (TOPAMAX) 200 MG tablet Take 1 tablet (200 mg total) by mouth 2 (two) times daily. 02/14/21  Yes Cameron Sprang, MD  vitamin B-12 (CYANOCOBALAMIN) 1000 MCG tablet Take 1,000 mcg by mouth daily.   Yes [provider]  ticagrelor (BRILINTA) 90 MG TABS tablet Take 1 tablet (90 mg total) by mouth 2 (two) times daily for 240 doses. Patient not taking: Reported on 06/27/2021 06/21/21 10/19/21  Theresa Duty, NP     History reviewed. No pertinent family history.  Social History   Socioeconomic History   Marital status: Single    Spouse name: Not on file   Number of children: Not on file   Years of education: Not on file   Highest education level: Not on file  Occupational History   Not on file  Tobacco Use   Smoking status: Never   Smokeless tobacco: Never  Vaping Use   Vaping Use: Never used  Substance and Sexual Activity   Alcohol use: Yes    Comment: rare   Drug use: Never   Sexual activity: Not on file  Other Topics Concern   Not on file  Social History Narrative   Right handed   Lives with mom   Social Determinants of  Health   Financial Resource Strain: Not on file  Food Insecurity: Not on file  Transportation Needs: Not on file  Physical Activity: Not on file  Stress: Not on file  Social Connections: Not on file    Review of Systems: A 12 point ROS discussed and pertinent positives are indicated in the HPI above.  All other systems are negative.  Review of Systems  Constitutional:  Negative for activity change, appetite change, fatigue, fever and unexpected weight change.  HENT:  Negative for hearing loss, tinnitus, trouble swallowing and voice change.   Eyes:  Negative for visual disturbance.  Respiratory:  Negative for cough and shortness of breath.   Cardiovascular:  Negative for chest pain.  Gastrointestinal:  Negative for abdominal pain, nausea and vomiting.  Musculoskeletal:  Negative for back pain and gait problem.  Neurological:  Positive for seizures. Negative for dizziness, tremors, syncope, facial asymmetry, speech difficulty, weakness, light-headedness, numbness and headaches.  Psychiatric/Behavioral:  Negative for behavioral problems and confusion.    Vital Signs: BP (!) 153/99    Pulse 81    Temp 98.5 F (36.9 C) (Oral)  Resp 18    Ht 5\' 3"  (1.6 m)    Wt 205 lb (93 kg)    LMP  (LMP Unknown) Comment: Has not had a cycle in 3 yrs due to the Depo inj.   SpO2 99%    BMI 36.31 kg/m   Physical Exam Vitals reviewed.  HENT:     Mouth/Throat:     Mouth: Mucous membranes are moist.  Eyes:     Extraocular Movements: Extraocular movements intact.  Cardiovascular:     Rate and Rhythm: Normal rate and regular rhythm.     Heart sounds: Normal heart sounds.  Pulmonary:     Effort: Pulmonary effort is normal.     Breath sounds: Normal breath sounds. No wheezing.  Abdominal:     General: Bowel sounds are normal. There is no distension.     Tenderness: There is no abdominal tenderness.  Musculoskeletal:        General: Normal range of motion.     Right lower leg: No edema.     Left  lower leg: No edema.  Skin:    General: Skin is warm.  Neurological:     Mental Status: She is alert and oriented to person, place, and time.  Psychiatric:        Behavior: Behavior normal.     Comments: Mother at bedside Pt is A/O--- able to sign own consemt    Imaging: US THYROID  Result Date: 06/07/2021 CLINICAL DATA:  History of papillary thyroid cancer, remote right thyroidectomy EXAM: THYROID ULTRASOUND TECHNIQUE: Ultrasound examination of the thyroid gland and adjacent soft tissues was performed. COMPARISON:  12/15/2020 FINDINGS: Parenchymal Echotexture: Mildly heterogenous Isthmus: 2 mm Right lobe: 1.3 x 0.6 x 0.8 cm Left lobe: 4.3 x 1.6 x 1.7 cm _________________________________________________________ Estimated total number of nodules >/= 1 cm: 0 Number of spongiform nodules >/=  2 cm not described below (TR1): 0 Number of mixed cystic and solid nodules >/= 1.5 cm not described below ([place]): 0 _________________________________________________________ Indistinct triangular hyperechoic right thyroid bed remnant suspected, measurements above Previously described left mid thyroid 1.2 cm TR 2 nodule is actually smaller now measuring 0.9 x 0.8 x 0.6 cm. No new thyroid abnormality. No adenopathy or hypervascularity. IMPRESSION: Overall stable exam.  Query trace right thyroid bed remnant. Smaller benign-appearing left mid thyroid TR 2 nodule now measuring 9 mm. No adenopathy The above is in keeping with the ACR TI-RADS recommendations - J Am Coll Radiol 2017;14:587-595. Electronically Signed   By: Jerilynn Mages.  Shick M.D.   On: 06/07/2021 12:06   IR Radiologist Eval & Mgmt  Result Date: 06/12/2021 EXAM: NEW PATIENT OFFICE VISIT CHIEF COMPLAINT: Evaluation for management of brain aneurysm. Current Pain Level: 1-10 HISTORY OF PRESENT ILLNESS: Patient is a 36 year old right handed lady who has been referred by Dr. Delice Lesch, a neurologist, for evaluation and management of a recently discovered brain aneurysm,  suspicious of having increased in size. The patient is accompanied by her mother. History was obtained from the patient and her mother, also performed was extensive review of previous hospital visits, and of neuroimaging studies in her electronic medical chart. As part of her evaluation of her seizures and previous brain surgery, patient underwent MRI of the brain in August of 2022. The suspicion of a left paraophthalmic artery aneurysm was raised. A follow-up MRA of the brain confirmed the presence of an approximately 6 mm x5 mm mildly enlarged left paraophthalmic artery aneurysm projecting superiorly and slightly medially. Clinically, the patient reports no  history of ordinary headaches, nausea, vomiting, visual aberrations, speech difficulties, photophobia or of limited weakness or paresthesias. 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 Patient and the mother reported only 2 seizures over the past year compared to previously. She reports no history of chest pains, palpitations, pedal edema, or shortness of breath. She denies any recent coughing, sneezing, wheezing of hemoptysis. Her appetite is normal. Weight is steady. Denies any difficulty with swallowing solids or liquids. She denies any constipation, diarrhea or melena. Denies any symptoms of dysuria, hematuria or of polyuria. No recent history of chills, fever or rigors. PAST MEDICAL HISTORY: Past Medical History: Diagnosis * : Date . * : Hyperthyroidism * : . * : Thyroid cancer ([place]) * : PAST SURGICAL HISTORY: Past Surgical History: Procedure * : Laterality * : Date . * : BRAIN SURGERY * : * : 05/03/2006 . * : THYROID SURGERY * : * : * : cancer . * : vns * : * : 09/28/2015 MEDICATIONS: Current Outpatient Medications on File Prior to Visit Medication * : Sig * : Dispense * : Refill . * : Ascorbic Acid (VITAMIN C) 1000 MG tablet * : Take 1,000 mg by mouth daily. * : * : . * : Biotin 10000 MCG TABS * :  Take by mouth. * : * : . * : calcium carbonate (OSCAL) 1500 (600 Ca) MG TABS tablet * : Take by mouth daily. * : * : . * : Cholecalciferol 50 MCG (2000 UT) TABS * : Take by mouth. * : * : . * : [employer] GUM[employer] BE[employer] CHILDREN PO * : Take by mouth. * : * : . * : cyanocobalamin 1000 MCG tablet * : Take by mouth. * : * : . * : levothyroxine (SYNTHROID) 75 MCG tablet * : Take by mouth. * : * : . * : LORazepam (ATIVAN) 2 MG tablet * : Take 2 mg by mouth. As needed for seizures * : * : . * : Medroxy PROGESTERone Acetate 150 MG/ML SUSY * : Inject into the muscle. * : * : . * : Multiple Vitamins-Minerals (MULTIVITAMIN WITH MINERALS) tablet * : Take 1 tablet by mouth daily. * : * : . * : NON FORMULARY * : Place 70 mg under the tongue in the morning and at bedtime. CBD oil * : * : . * : Omega-3 Fatty Acids (FISH OIL) 1200 MG CPDR * : Take 1 capsule by mouth daily. * : * : . * : topiramate (TOPAMAX) 200 MG tablet * : Take 200 mg by mouth 2 (two) times daily. * : * : ALLERGIES: Allergies Allergen * : Reactions . * : Estrogens * : Other (See Comments) * : * : Other reaction(s): Seizures . * : Amoxicillin * : Hives . * : Latex * : Hives . * : Pregabalin * : Other (See Comments) * : * : Excessive weight gain FAMILY HISTORY: History reviewed. No pertinent family history. SOCIAL HISTORY: Socioeconomic History . * : Marital status: * : Single * : * : Spouse name: * : Not on file . * : Number of children: * : Not on file . * : Years of education: * : Not on file . * : Highest education level: * : Not on file Occupational History . * : Not on file Tobacco Use . * : Smoking status: * : Never . * :  Smokeless tobacco: * : Never Vaping Use . * : Vaping Use: * : Never used Substance and Sexual Activity . * : Alcohol use: * : Yes * : * : Comment: rare . * : Drug use: * : Never . * : Sexual activity: * : Not on file Other Topics * : Concern . * : Not on file Social History Narrative * : Right handed * : Lives with mom Food Insecurity: Not on  file Transportation Needs: Not on file Physical Activity: Not on file Stress: Not on file Social Connections: Not on file Intimate Partner Violence: Not on file No family history of brain aneurysm. REVIEW OF SYSTEMS: As above. PHYSICAL EXAMINATION: Fully awake, alert, oriented appropriately. No acute distress. Normal eye contact. No speech difficulties. Grossly no lateralizing neurologic abnormalities identified. Station and gait normal. ASSESSMENT AND PLAN: Patient's available neuro imaging studies were reviewed. The MRA findings confirmed the presence of a moderately wide neck left paraophthalmic region aneurysm which does not appear to involve the origin of the ipsilateral ophthalmic artery. It measures approximately 6.2 mm x 5 mm. No other intracranial aneurysm identified on images provided. Patient and mother were informed that the discovery of the brain aneurysm was an incidental finding and probably has no relationship to her temporal lobe epilepsy. Natural history of unruptured brain aneurysm was reviewed with them. Mentioned was the usual expected risk of rupture of 1-2% per year. In the event of a rupture, results of significant mortality and morbidity was reviewed. Probable factors considered to increase the risk of aneurysm growth and rupture were those female gender, smoking, hypertension, and use of illicit drugs. Management considerations were those of eliminating the brain aneurysm from circulation and in order to avoid and prevent growth and/or rupture. The endovascular option was reviewed. The endovascular possibly were those of primary coiling, versus stent assisted coiling versus treatment with a flow diverter, or an intra saccular flow disruption device such as WEB. The risk of endovascular treatment would be 1% chance of a complication such as thrombotic stroke, intraprocedural rupture with remote possibility of death. Also mentioned was remote possibility of a postprocedural delayed  intracranial hemorrhage. The procedure would be under general anesthesia and require overnight stay in the neuro ICU. The procedure would be either via a radial or femoral route. A diagnostic catheter arteriogram would be followed by an endovascular embolization of the brain aneurysm under general anesthesia. The patient would have to be started on aspirin 81 mg a day, and Brilinta 90 mg twice a day for at least 7 days prior to the procedure. Both the patient and the mother expressed the desire to proceed with the endovascular treatment of the brain aneurysm. This will be scheduled at the earliest possible. In the meantime, the patient was advised to continue taking her present medications. The management of VNS at the time of procedure will be coordinated with Dr. Delice Lesch. Patient and the mother expressed satisfaction and understanding with the management plan. Nevertheless, they were asked to call should they have any concerns or questions. Electronically Signed   By: Luanne Bras M.D.   On: 06/12/2021 08:13    Labs:  CBC: No results for input(s): WBC, HGB, HCT, PLT in the last 8760 hours.  COAGS: No results for input(s): INR, APTT in the last 8760 hours.  BMP: No results for input(s): NA, K, CL, CO2, GLUCOSE, BUN, CALCIUM, CREATININE, GFRNONAA, GFRAA in the last 8760 hours.  Invalid input(s): CMP  LIVER FUNCTION TESTS: No  results for input(s): BILITOT, AST, ALT, ALKPHOS, PROT, ALBUMIN in the last 8760 hours.  TUMOR MARKERS: No results for input(s): AFPTM, CEA, CA199, CHROMGRNA in the last 8760 hours.  Assessment and Plan:  Left paraopthalmic artery aneurysm found incidentally evaluating known seizures in follow up with Dr Delice Lesch Pt is scheduled now for left paraophthalmic artery aneurysm evaluation and possible embolization Risks and benefits of cerebral angiogram with intervention were discussed with the patient including, but not limited to bleeding, infection, vascular injury,  contrast induced renal failure, stroke or even death.  This interventional procedure involves the use of X-rays and because of the nature of the planned procedure, it is possible that we will have prolonged use of X-ray fluoroscopy.  Potential radiation risks to you include (but are not limited to) the following: - A slightly elevated risk for cancer  several years later in life. This risk is typically less than 0.5% percent. This risk is low in comparison to the normal incidence of human cancer, which is 33% for women and 50% for men according to the Council. - Radiation induced injury can include skin redness, resembling a rash, tissue breakdown / ulcers and hair loss (which can be temporary or permanent).   The likelihood of either of these occurring depends on the difficulty of the procedure and whether you are sensitive to radiation due to previous procedures, disease, or genetic conditions.   IF your procedure requires a prolonged use of radiation, you will be notified and given written instructions for further action.  It is your responsibility to monitor the irradiated area for the 2 weeks following the procedure and to notify your physician if you are concerned that you have suffered a radiation induced injury.    All of the patient's questions were answered, patient is agreeable to proceed.  Consent signed and in chart. They are aware pt will be admitted to Neuro ICU overnight and planned for DC in am if stable   Thank you for this interesting consult.  I greatly enjoyed meeting Sandra Collins and look forward to participating in their care.  A copy of this report was sent to the requesting provider on this date.  Electronically Signed: Lavonia Drafts, PA-C 07/03/2021, 6:59 AM   I spent a total of  30 Minutes   in face to face in clinical consultation, greater than 50% of which was counseling/coordinating care for cerebral arteriogram with possible left  paraophthalmic artery aneurysm embolization

## 2021-07-03 NOTE — Anesthesia Procedure Notes (Signed)
Procedure Name: Intubation Date/Time: 07/03/2021 9:07 AM Performed by: Mariea Clonts, CRNA Pre-anesthesia Checklist: Patient identified, Emergency Drugs available, Suction available and Patient being monitored Patient Re-evaluated:Patient Re-evaluated prior to induction Oxygen Delivery Method: Circle System Utilized Preoxygenation: Pre-oxygenation with 100% oxygen Induction Type: IV induction Ventilation: Mask ventilation without difficulty Laryngoscope Size: Miller and 2 Grade View: Grade I Tube type: Oral Tube size: 7.0 mm Number of attempts: 1 Airway Equipment and Method: Stylet and Oral airway Placement Confirmation: ETT inserted through vocal cords under direct vision, positive ETCO2 and breath sounds checked- equal and bilateral Tube secured with: Tape Dental Injury: Teeth and Oropharynx as per pre-operative assessment

## 2021-07-03 NOTE — Progress Notes (Signed)
°  Transition of Care [facility]) Screening Note   Patient Details  Name: Corrinna Karapetyan Date of Birth: 1985-12-21   Transition of Care [facility]) CM/SW Contact:    Benard Halsted, LCSW Phone Number: 07/03/2021, 3:04 PM    Transition of Care Department [facility]) has reviewed patient and no TOC needs have been identified at this time. We will continue to monitor patient advancement through interdisciplinary progression rounds. If new patient transition needs arise, please place a TOC consult.

## 2021-07-03 NOTE — Progress Notes (Signed)
Discussed with provider, patient approved to take evening dose of CBD oil under observation.  Patient's family will take home medication and resume dosing on discharge.   Thank you for allowing pharmacy to be a part of this patients care.  Donnald Garre, PharmD Clinical Pharmacist  Please check AMION for all [place] numbers After 10:00 PM, call [place] [phone]

## 2021-07-03 NOTE — Progress Notes (Signed)
[place] for Heparin IV Indication:  post neuro intervention  Allergies  Allergen Reactions   Estrogens Other (See Comments)    Seizures   Amoxicillin Hives   Latex Hives   Metronidazole Hives   Pregabalin Other (See Comments)    Excessive weight gain      Patient Measurements: Height: 5\' 3"  (160 cm) Weight: 93 kg (205 lb) IBW/kg (Calculated) : 52.4 Heparin Dosing Weight: 73.7 kg  Vital Signs: Temp: 98.6 F (37 C) (02/06 2000) Temp Source: Oral (02/06 2000) BP: 103/72 (02/06 2100) Pulse Rate: 98 (02/06 2145)  Labs: Recent Labs    07/03/21 0840 07/03/21 0859 07/03/21 2043  HGB 13.6 13.5  --   HCT 40.0 39.8  --   PLT  --  207  --   LABPROT  --  13.5  --   INR  --  1.0  --   HEPARINUNFRC  --   --  <0.10*  CREATININE 0.80 0.85  --      Estimated Creatinine Clearance: 101.9 mL/min (by C-G formula based on SCr of 0.85 mg/dL).   Medical History: Past Medical History:  Diagnosis Date   Depression    Epilepsy ([place])    Hyperthyroidism    Short-term memory loss    Caused by seizures   Thyroid cancer ([place])     Medications:  Infusions:   sodium chloride 75 mL/hr at 07/03/21 1900   clevidipine 10 mg/hr (07/03/21 2035)   heparin 500 Units/hr (07/03/21 1900)    Assessment: 36 yo F presenting for cerebral arteriogram with possible embolization of left paraophthalmic artery aneurysm. Pharmacy consulted for IV heparin post procedure.   Initial heparin level undetectable on 500 units/hr. No bleeding or IV issues noted.   Goal of Therapy:  Heparin level 0.1-0.25 units/ml Monitor platelets by anticoagulation protocol: Yes   Plan:  Heparin drip at 650 units/hr Plan to discontinue therapy 2/7 @0800  Daily heparin level and CBC not ordered Monitor for signs/symptoms of bleed   Thank you for allowing pharmacy to be a part of this patients care.  Erin Hearing PharmD., BCPS Clinical Pharmacist 07/03/2021 9:59 PM

## 2021-07-03 NOTE — Progress Notes (Signed)
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   Subjective:  Pt resting in bed. Sandra Collins denies HA, N/V or pain. Mother at bedside.   Allergies: Estrogens, Amoxicillin, Latex, Metronidazole, and Pregabalin  Medications: Prior to Admission medications   Medication Sig Start Date End Date Taking? Authorizing Provider  Ascorbic Acid (VITAMIN C) 1000 MG tablet Take 1,000 mg by mouth daily.    [provider]  aspirin EC 81 MG tablet Take 81 mg by mouth daily. Swallow whole.    [provider]  b complex vitamins capsule Take 1 capsule by mouth daily.    [provider]  Biotin 10000 MCG TABS Take 10,000 mcg by mouth daily.    [provider]  Calcium Carb-Cholecalciferol (CALCIUM 600/VITAMIN D PO) Take 1 tablet by mouth daily.    [provider]  cholecalciferol (VITAMIN D3) 25 MCG (1000 UNIT) tablet Take 1,000 Units by mouth daily.    [provider]  clopidogrel (PLAVIX) 75 MG tablet Take 1 tablet (75 mg total) by mouth daily. 06/21/21 10/19/21  Theresa Duty, NP  [employer]IL[employer]EN PO Take 2 each by mouth daily as needed (regularity).    [provider]  ibuprofen (ADVIL) 200 MG tablet Take 400 mg by mouth every 6 (six) hours as needed for headache.    [provider]  levothyroxine (SYNTHROID) 75 MCG tablet Take 37.5 mcg by mouth daily before breakfast. 05/27/20   [provider]  LORazepam (ATIVAN) 2 MG tablet Take 2 mg by mouth daily as needed for seizure.    [provider]  medroxyPROGESTERone (DEPO-PROVERA) 150 MG/ML injection Inject 150 mg into the muscle every 3 (three) months. 06/22/20   [provider]  Misc Natural Products (T-RELIEF CBD+13 SL) Place under the  tongue.    [provider]  Multiple Vitamins-Minerals (MULTIVITAMIN WITH MINERALS) tablet Take 1 tablet by mouth daily.    [provider]  NON FORMULARY Place 70 mg under the tongue in the morning and at bedtime. CBD oil    [provider]  Omega-3 Fatty Acids (FISH OIL) 1200 MG CPDR Take 1,200 mg by mouth daily. 02/23/20   [provider]  ticagrelor (BRILINTA) 90 MG TABS tablet Take 1 tablet (90 mg total) by mouth 2 (two) times daily for 240 doses. Patient not taking: Reported on 06/27/2021 06/21/21 10/19/21  Theresa Duty, NP  topiramate (TOPAMAX) 200 MG tablet Take 1 tablet (200 mg total) by mouth 2 (two) times daily. 02/14/21   Cameron Sprang, MD  vitamin B-12 (CYANOCOBALAMIN) 1000 MCG tablet Take 1,000 mcg by mouth daily.    [provider]     Vital Signs: 117/89, HR 68,  RR 20, 97.6, 99% RA  Physical Exam Constitutional:      Appearance: Normal appearance. Sandra Collins is not ill-appearing.  HENT:     Head: Normocephalic and atraumatic.  Eyes:     Extraocular Movements: Extraocular movements intact.     Pupils: Pupils are equal, round, and reactive to light.     Comments: Pupils 3-4 mm  Cardiovascular:     Rate and Rhythm: Normal rate and regular rhythm.  Pulmonary:     Effort: No respiratory distress.  Musculoskeletal:     Right lower leg:  No edema.     Left lower leg: No edema.  Skin:    General: Skin is warm and dry.     Comments: Site is soft with no active bleeding and no appreciable pseudoaneurysm. Dressing is C/D/I   Neurological:     General: No focal deficit present.     Mental Status: Sandra Collins is alert and oriented to person, place, and time.     Comments:  Alert, aware and oriented X 3 Speech and comprehension is intact.  Comprehension and fluency are normal.  Judgment and insight normal PERRL bilaterally No facial droop noted Tongue midline Can spontaneously move all 4 extremities. Hand grip strength equal  bilaterally. Dorsiflexion 5/5 bilaterally. Plantar flexion 5/5 bilaterally.  Negative pronator drift Fine motor and coordination intact.  Gait not assessed Romberg not assessed Heel to toe not assessed Pt denies HA.   Distal pulse palpable on operative lower extremity      Psychiatric:        Mood and Affect: Mood normal.        Behavior: Behavior normal.        Thought Content: Thought content normal.        Judgment: Judgment normal.    Imaging: No results found.  Labs:  CBC: Recent Labs    07/03/21 0840 07/03/21 0859  WBC  --  5.4  HGB 13.6 13.5  HCT 40.0 39.8  PLT  --  207    COAGS: Recent Labs    07/03/21 0859  INR 1.0    BMP: Recent Labs    07/03/21 0840 07/03/21 0859  NA 142 138  K 3.6 3.5  CL 113* 112*  CO2  --  16*  GLUCOSE 98 93  BUN 8 8  CALCIUM  --  8.5*  CREATININE 0.80 0.85  GFRNONAA  --  >60    LIVER FUNCTION TESTS: No results for input(s): BILITOT, AST, ALT, ALKPHOS, PROT, ALBUMIN in the last 8760 hours.  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 Pt denies HA, N/V, pain.  Pt mother at bedside.  Groin site WNL. Pt advised to keep RLE straight. Please assist pt with limiting RLE movement.  Pt has CBD oil at bedside to take at bedtime. Per Dr. Estanislado Pandy, Sandra Collins can take as Sandra Collins would at home.  Please call IR with questions or concerns.    Electronically Signed: Tyson Alias, NP 07/03/2021, 3:40 PM   I spent a total of 25 Minutes at the the patient's bedside AND on the patient's hospital floor or unit, greater than 50% of which was counseling/coordinating care for embolism of Lt ICA para opthalmic aneurysm.

## 2021-07-03 NOTE — Procedures (Signed)
INR.  Bilateral common carotid and RT Vert artery angiograms . RT CFA approach. Findings. 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 POst CT no ICH  6F angioseal for RT CFA hemostasis. Distal pulse intact. Extubated. Denies any h/As,N/V  pupils 3 to 4 mm Rt = LT sluggish. No facial asymmetry. Tongue midline.  Moves all 4s equally spontaneously and to command. S.Vincenzo Stave MD.

## 2021-07-03 NOTE — Anesthesia Procedure Notes (Signed)
Arterial Line Insertion Start/End2/10/2021 8:45 AM, 07/03/2021 8:50 AM Performed by: Darral Dash, DO, anesthesiologist  Patient location: Pre-op. Preanesthetic checklist: patient identified, IV checked, site marked, risks and benefits discussed, surgical consent, monitors and equipment checked, pre-op evaluation, timeout performed and anesthesia consent Lidocaine 1% used for infiltration Left, radial was placed Catheter size: 20 G Hand hygiene performed  and maximum sterile barriers used   Attempts: 1 Procedure performed using ultrasound guided technique. Ultrasound Notes:anatomy identified, needle tip was noted to be adjacent to the nerve/plexus identified and no ultrasound evidence of intravascular and/or intraneural injection Following insertion, dressing applied and Biopatch. Post procedure assessment: normal and unchanged  Post procedure complications: second provider assisted. Patient tolerated the procedure well with no immediate complications.

## 2021-07-03 NOTE — Sedation Documentation (Signed)
Bedside report given. Right fem dressing and pulses checked.

## 2021-07-03 NOTE — Transfer of Care (Signed)
Immediate Anesthesia Transfer of Care Note  Patient: Sandra Collins  Procedure(s) Performed: Cerebral angiogram with intent to embolize brain aneurysm  Patient Location: PACU  Anesthesia Type:General  Level of Consciousness: awake, alert  and oriented  Airway & Oxygen Therapy: Patient Spontanous Breathing and Patient connected to nasal cannula oxygen  Post-op Assessment: Report given to RN and Post -op Vital signs reviewed and stable  Post vital signs: Reviewed and stable  Last Vitals:  Vitals Value Taken Time  BP 114/75 07/03/21 1218  Temp 36.5 C 07/03/21 1218  Pulse 80 07/03/21 1229  Resp 20 07/03/21 1229  SpO2 100 % 07/03/21 1229  Vitals shown include unvalidated device data.  Last Pain:  Vitals:   07/03/21 1218  TempSrc:   PainSc: 0-No pain      Patients Stated Pain Goal: 2 (07/26/29 4388)  Complications: No notable events documented.

## 2021-07-03 NOTE — Anesthesia Postprocedure Evaluation (Signed)
Anesthesia Post Note  Patient: Sandra Collins  Procedure(s) Performed: Cerebral angiogram with intent to embolize brain aneurysm     Patient location during evaluation: PACU Anesthesia Type: General Level of consciousness: awake and alert Pain management: pain level controlled Vital Signs Assessment: post-procedure vital signs reviewed and stable Respiratory status: spontaneous breathing, nonlabored ventilation, respiratory function stable and patient connected to nasal cannula oxygen Cardiovascular status: blood pressure returned to baseline and stable Postop Assessment: no apparent nausea or vomiting Anesthetic complications: no   No notable events documented.  Last Vitals:  Vitals:   07/03/21 1318 07/03/21 1400  BP: 121/86 117/89  Pulse: 73 68  Resp: 15 20  Temp: 36.8 C 36.4 C  SpO2: 100% 99%    Last Pain:  Vitals:   07/03/21 1400  TempSrc:   PainSc: 0-No pain                 Belenda Cruise P Khayri Kargbo

## 2021-07-03 NOTE — Progress Notes (Signed)
ANTICOAGULATION CONSULT NOTE - Initial Consult  Pharmacy Consult for Heparin IV Indication:  post neuro intervention  Allergies  Allergen Reactions   Estrogens Other (See Comments)    Seizures   Amoxicillin Hives   Latex Hives   Metronidazole Hives   Pregabalin Other (See Comments)    Excessive weight gain      Patient Measurements: Height: 5\' 3"  (160 cm) Weight: 93 kg (205 lb) IBW/kg (Calculated) : 52.4 Heparin Dosing Weight: 73.7 kg  Vital Signs: Temp: 98.2 F (36.8 C) (02/06 1318) Temp Source: Oral (02/06 0637) BP: 121/86 (02/06 1318) Pulse Rate: 73 (02/06 1318)  Labs: Recent Labs    07/03/21 0840 07/03/21 0859  HGB 13.6 13.5  HCT 40.0 39.8  PLT  --  207  LABPROT  --  13.5  INR  --  1.0  CREATININE 0.80 0.85    Estimated Creatinine Clearance: 101.9 mL/min (by C-G formula based on SCr of 0.85 mg/dL).   Medical History: Past Medical History:  Diagnosis Date   Depression    Epilepsy ([place])    Hyperthyroidism    Short-term memory loss    Caused by seizures   Thyroid cancer ([place])     Medications:  Infusions:   heparin      Assessment: 36 yo F presenting for cerebral arteriogram with possible embolization of left paraophthalmic artery aneurysm. Pharmacy consulted for IV heparin post procedure  Goal of Therapy:  Heparin level 0.1-0.25 units/ml Monitor platelets by anticoagulation protocol: Yes   Plan:  Heparin drip at 500 units/hr Heparin level at [place] to discontinue therapy 2/7 @0800  Daily heparin level and CBC not ordered Monitor for signs/symptoms of bleed   Thank you for allowing pharmacy to be a part of this patients care.  Donnald Garre, PharmD Clinical Pharmacist  Please check AMION for all [place] numbers After 10:00 PM, call [place] [phone]

## 2021-07-04 ENCOUNTER — Encounter (HOSPITAL_COMMUNITY): Payer: Self-pay | Admitting: Interventional Radiology

## 2021-07-04 LAB — CBC WITH DIFFERENTIAL/PLATELET
Abs Immature Granulocytes: 0.02 10*3/uL (ref 0.00–0.07)
Basophils Absolute: 0 10*3/uL (ref 0.0–0.1)
Basophils Relative: 0 %
Eosinophils Absolute: 0 10*3/uL (ref 0.0–0.5)
Eosinophils Relative: 0 %
HCT: 36.3 % (ref 36.0–46.0)
Hemoglobin: 12.6 g/dL (ref 12.0–15.0)
Immature Granulocytes: 0 %
Lymphocytes Relative: 25 %
Lymphs Abs: 2.2 10*3/uL (ref 0.7–4.0)
MCH: 31.6 pg (ref 26.0–34.0)
MCHC: 34.7 g/dL (ref 30.0–36.0)
MCV: 91 fL (ref 80.0–100.0)
Monocytes Absolute: 0.5 10*3/uL (ref 0.1–1.0)
Monocytes Relative: 6 %
Neutro Abs: 6.3 10*3/uL (ref 1.7–7.7)
Neutrophils Relative %: 69 %
Platelets: 220 10*3/uL (ref 150–400)
RBC: 3.99 MIL/uL (ref 3.87–5.11)
RDW: 12.2 % (ref 11.5–15.5)
WBC: 9.1 10*3/uL (ref 4.0–10.5)
nRBC: 0 % (ref 0.0–0.2)

## 2021-07-04 LAB — BASIC METABOLIC PANEL
Anion gap: 10 (ref 5–15)
BUN: 6 mg/dL (ref 6–20)
CO2: 16 mmol/L — ABNORMAL LOW (ref 22–32)
Calcium: 8.2 mg/dL — ABNORMAL LOW (ref 8.9–10.3)
Chloride: 114 mmol/L — ABNORMAL HIGH (ref 98–111)
Creatinine, Ser: 0.82 mg/dL (ref 0.44–1.00)
GFR, Estimated: >60 mL/min (ref >60)
Glucose, Bld: 93 mg/dL (ref 70–99)
Potassium: 2.9 mmol/L — ABNORMAL LOW (ref 3.5–5.1)
Sodium: 140 mmol/L (ref 135–145)

## 2021-07-04 NOTE — Progress Notes (Signed)
Pt's arterial line dampened. RN going by cuff pressure to titrate cleviprex.

## 2021-07-04 NOTE — Discharge Instructions (Addendum)
To whom it may concern,  Sandra Collins may undergo a dental cleaning on 07/17/21 as planned, she may not hold her Aspirin or Plavix for any dental procedure until at least 08/01/21 without prior permission from my office.  Please call my office at [phone] with any questions or concerns. If there is any additional required paperwork it may be faxed to [phone].  Luanne Bras, MD

## 2021-07-04 NOTE — Progress Notes (Signed)
Orthopedic Tech Progress Note Patient Details:  Sandra Collins 04/10/1986 [id number]  Patient ID: [name], female   DOB: [birth year], 36 y.o.   MRN: [id number] I spoke with Rn. They said the patient already had the knee immobilizer is on. Karolee Stamps 07/04/2021, 1:31 AM

## 2021-07-04 NOTE — Plan of Care (Signed)

## 2021-07-04 NOTE — Progress Notes (Signed)
Patient and mother given discharge and medication administration instructions. Both verbalize understanding. Vital signs stable, A/Ox4. Leaves in private vehicle with mother.

## 2021-07-04 NOTE — Discharge Summary (Signed)
Patient ID: Sandra Collins MRN: [id number] DOB/AGE: 1986-02-12 36 y.o.  Admit date: 07/03/2021 Discharge date: 07/04/2021  Supervising Physician: Luanne Bras  Patient Status: [facility] - In-pt  Admission Diagnoses: Left paraophthalmic artery aneurysm  Discharge Diagnoses:  Principal Problem:   Aneurysm, ophthalmic artery Active Problems:   Brain aneurysm   Discharged Condition: good  Hospital Course: Sandra Collins is a 36 y/o F with an extensive history of epilepsy who presented to [facility] on 07/03/21 for planned outpatient cerebral angiogram with possible embolization of a left paraophthalmic artery aneurysm. She underwent the procedure that same day without immediate complication and was admitted for overnight observation.   Patient seen at bedside this morning, mom on her way in to give CBD oil for epilepsy. Sandra Collins reports that she had a seizure overnight and required Ativan, once she had Ativan she felt much better and did not require any additional interventions. She noted her temperature to be over 99.2 which is usually a cause of her seizures, this morning her temperature was 98.4 which she is pleased with. She denies any complaints this morning, ate breakfast without difficulty, has not been OOB yet. She is asking that we complete paperwork for her to have a dental cleaning on 2/20 - she states her dentist will do the procedure while on ASA + Plavix, they just require a note from [facility] stating it is ok.   Reviewed groin site care including no bending/stooping/lifting >10 lbs/running/excessive stair use until she is seen by Dr. Estanislado Pandy in 2 weeks. She was encouraged to maintain adequate hydration with water and to take all of her medications, including ASA + Plavix, as prescribed.   Consults: None  Significant Diagnostic Studies: US THYROID  Result Date: 06/07/2021 CLINICAL DATA:  History of papillary thyroid cancer, remote right thyroidectomy EXAM: THYROID ULTRASOUND  TECHNIQUE: Ultrasound examination of the thyroid gland and adjacent soft tissues was performed. COMPARISON:  12/15/2020 FINDINGS: Parenchymal Echotexture: Mildly heterogenous Isthmus: 2 mm Right lobe: 1.3 x 0.6 x 0.8 cm Left lobe: 4.3 x 1.6 x 1.7 cm _________________________________________________________ Estimated total number of nodules >/= 1 cm: 0 Number of spongiform nodules >/=  2 cm not described below (TR1): 0 Number of mixed cystic and solid nodules >/= 1.5 cm not described below ([place]): 0 _________________________________________________________ Indistinct triangular hyperechoic right thyroid bed remnant suspected, measurements above Previously described left mid thyroid 1.2 cm TR 2 nodule is actually smaller now measuring 0.9 x 0.8 x 0.6 cm. No new thyroid abnormality. No adenopathy or hypervascularity. IMPRESSION: Overall stable exam.  Query trace right thyroid bed remnant. Smaller benign-appearing left mid thyroid TR 2 nodule now measuring 9 mm. No adenopathy The above is in keeping with the ACR TI-RADS recommendations - J Am Coll Radiol 2017;14:587-595. Electronically Signed   By: Jerilynn Mages.  Shick M.D.   On: 06/07/2021 12:06   IR Radiologist Eval & Mgmt  Result Date: 06/12/2021 EXAM: NEW PATIENT OFFICE VISIT CHIEF COMPLAINT: Evaluation for management of brain aneurysm. Current Pain Level: 1-10 HISTORY OF PRESENT ILLNESS: Patient is a 36 year old right handed lady who has been referred by Dr. Delice Lesch, a neurologist, for evaluation and management of a recently discovered brain aneurysm, suspicious of having increased in size. The patient is accompanied by her mother. History was obtained from the patient and her mother, also performed was extensive review of previous hospital visits, and of neuroimaging studies in her electronic medical chart. As part of her evaluation of her seizures and previous brain surgery, patient underwent MRI of  the brain in August of 2022. The suspicion of a left paraophthalmic  artery aneurysm was raised. A follow-up MRA of the brain confirmed the presence of an approximately 6 mm x5 mm mildly enlarged left paraophthalmic artery aneurysm projecting superiorly and slightly medially. Clinically, the patient reports no history of ordinary headaches, nausea, vomiting, visual aberrations, speech difficulties, photophobia or of limited weakness or paresthesias. 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 Patient and the mother reported only 2 seizures over the past year compared to previously. She reports no history of chest pains, palpitations, pedal edema, or shortness of breath. She denies any recent coughing, sneezing, wheezing of hemoptysis. Her appetite is normal. Weight is steady. Denies any difficulty with swallowing solids or liquids. She denies any constipation, diarrhea or melena. Denies any symptoms of dysuria, hematuria or of polyuria. No recent history of chills, fever or rigors. PAST MEDICAL HISTORY: Past Medical History: Diagnosis * : Date . * : Hyperthyroidism * : . * : Thyroid cancer ([place]) * : PAST SURGICAL HISTORY: Past Surgical History: Procedure * : Laterality * : Date . * : BRAIN SURGERY * : * : 05/03/2006 . * : THYROID SURGERY * : * : * : cancer . * : vns * : * : 09/28/2015 MEDICATIONS: Current Outpatient Medications on File Prior to Visit Medication * : Sig * : Dispense * : Refill . * : Ascorbic Acid (VITAMIN C) 1000 MG tablet * : Take 1,000 mg by mouth daily. * : * : . * : Biotin 10000 MCG TABS * : Take by mouth. * : * : . * : calcium carbonate (OSCAL) 1500 (600 Ca) MG TABS tablet * : Take by mouth daily. * : * : . * : Cholecalciferol 50 MCG (2000 UT) TABS * : Take by mouth. * : * : . * : [employer] FIBER GUMMY BEARS CHILDREN PO * : Take by mouth. * : * : . * : cyanocobalamin 1000 MCG tablet * : Take by mouth. * : * : . * : levothyroxine (SYNTHROID) 75 MCG tablet * : Take by mouth. * : * : . * : LORazepam (ATIVAN) 2 MG  tablet * : Take 2 mg by mouth. As needed for seizures * : * : . * : Medroxy PROGESTERone Acetate 150 MG/ML SUSY * : Inject into the muscle. * : * : . * : Multiple Vitamins-Minerals (MULTIVITAMIN WITH MINERALS) tablet * : Take 1 tablet by mouth daily. * : * : . * : NON FORMULARY * : Place 70 mg under the tongue in the morning and at bedtime. CBD oil * : * : . * : Omega-3 Fatty Acids (FISH OIL) 1200 MG CPDR * : Take 1 capsule by mouth daily. * : * : . * : topiramate (TOPAMAX) 200 MG tablet * : Take 200 mg by mouth 2 (two) times daily. * : * : ALLERGIES: Allergies Allergen * : Reactions . * : Estrogens * : Other (See Comments) * : * : Other reaction(s): Seizures . * : Amoxicillin * : Hives . * : Latex * : Hives . * : Pregabalin * : Other (See Comments) * : * : Excessive weight gain FAMILY HISTORY: History reviewed. No pertinent family history. SOCIAL HISTORY: Socioeconomic History . * : Marital status: * : Single * : * : Spouse name: * : Not on file . * : Number of  children: * : Not on file . * : Years of education: * : Not on file . * : Highest education level: * : Not on file Occupational History . * : Not on file Tobacco Use . * : Smoking status: * : Never . * : Smokeless tobacco: * : Never Vaping Use . * : Vaping Use: * : Never used Substance and Sexual Activity . * : Alcohol use: * : Yes * : * : Comment: rare . * : Drug use: * : Never . * : Sexual activity: * : Not on file Other Topics * : Concern . * : Not on file Social History Narrative * : Right handed * : Lives with mom Food Insecurity: Not on file Transportation Needs: Not on file Physical Activity: Not on file Stress: Not on file Social Connections: Not on file Intimate Partner Violence: Not on file No family history of brain aneurysm. REVIEW OF SYSTEMS: As above. PHYSICAL EXAMINATION: Fully awake, alert, oriented appropriately. No acute distress. Normal eye contact. No speech difficulties. Grossly no lateralizing neurologic abnormalities identified.  Station and gait normal. ASSESSMENT AND PLAN: Patient's available neuro imaging studies were reviewed. The MRA findings confirmed the presence of a moderately wide neck left paraophthalmic region aneurysm which does not appear to involve the origin of the ipsilateral ophthalmic artery. It measures approximately 6.2 mm x 5 mm. No other intracranial aneurysm identified on images provided. Patient and mother were informed that the discovery of the brain aneurysm was an incidental finding and probably has no relationship to her temporal lobe epilepsy. Natural history of unruptured brain aneurysm was reviewed with them. Mentioned was the usual expected risk of rupture of 1-2% per year. In the event of a rupture, results of significant mortality and morbidity was reviewed. Probable factors considered to increase the risk of aneurysm growth and rupture were those female gender, smoking, hypertension, and use of illicit drugs. Management considerations were those of eliminating the brain aneurysm from circulation and in order to avoid and prevent growth and/or rupture. The endovascular option was reviewed. The endovascular possibly were those of primary coiling, versus stent assisted coiling versus treatment with a flow diverter, or an intra saccular flow disruption device such as WEB. The risk of endovascular treatment would be 1% chance of a complication such as thrombotic stroke, intraprocedural rupture with remote possibility of death. Also mentioned was remote possibility of a postprocedural delayed intracranial hemorrhage. The procedure would be under general anesthesia and require overnight stay in the neuro ICU. The procedure would be either via a radial or femoral route. A diagnostic catheter arteriogram would be followed by an endovascular embolization of the brain aneurysm under general anesthesia. The patient would have to be started on aspirin 81 mg a day, and Brilinta 90 mg twice a day for at least 7 days  prior to the procedure. Both the patient and the mother expressed the desire to proceed with the endovascular treatment of the brain aneurysm. This will be scheduled at the earliest possible. In the meantime, the patient was advised to continue taking her present medications. The management of VNS at the time of procedure will be coordinated with Dr. Delice Lesch. Patient and the mother expressed satisfaction and understanding with the management plan. Nevertheless, they were asked to call should they have any concerns or questions. Electronically Signed   By: Luanne Bras M.D.   On: 06/12/2021 08:13    Treatments: IV hydration and anticoagulation: ASA, Plavix, and heparin  Discharge Exam: Blood pressure 120/83, pulse 85, temperature 98.8 F (37.1 C), temperature source Oral, resp. rate (!) 21, height 5\' 3"  (1.6 m), weight 205 lb (93 kg), SpO2 98 %. Physical Exam Vitals and nursing note reviewed.  Constitutional:      General: She is not in acute distress. HENT:     Head: Normocephalic.  Cardiovascular:     Rate and Rhythm: Normal rate.     Comments: (+) Right CFA puncture site clean, dry, dressed appropriately. Mildly TTP in upper outer quadrant but otherwise non-tender. No erythema, edema, bleeding, drainage. Non-pulsatile, soft. Pulmonary:     Effort: Pulmonary effort is normal.  Abdominal:     General: There is no distension.     Palpations: Abdomen is soft.     Tenderness: There is no abdominal tenderness.  Skin:    General: Skin is warm and dry.  Neurological:     Mental Status: She is alert.  Alert, awake, and oriented x 3 Speech and comprehension in tact PER - significantly dilated bilaterally, per patient this is normal when she is under stress EOMs without nystagmus or subjective diplopia. Visual fields grossly in tact No facial asymmetry. Tongue midline  Motor power full in all 4 extremities Fine motor and coordination at baseline Gait - not assessed   Disposition:     Allergies as of 07/04/2021       Reactions   Estrogens Other (See Comments)   Seizures   Amoxicillin Hives   Latex Hives   Metronidazole Hives   Pregabalin Other (See Comments)   Excessive weight gain        Medication List     TAKE these medications    aspirin EC 81 MG tablet Take 81 mg by mouth daily. Swallow whole.   b complex vitamins capsule Take 1 capsule by mouth daily.   Biotin 10000 MCG Tabs Take 10,000 mcg by mouth daily.   CALCIUM 600/VITAMIN D PO Take 1 tablet by mouth daily.   cholecalciferol 25 MCG (1000 UNIT) tablet Commonly known as: VITAMIN D3 Take 1,000 Units by mouth daily.   clopidogrel 75 MG tablet Commonly known as: Plavix Take 1 tablet (75 mg total) by mouth daily.   [employer] FIBER GUMMY BEARS CHILDREN PO Take 2 each by mouth daily as needed (regularity).   Fish Oil 1200 MG Cpdr Take 1,200 mg by mouth daily.   ibuprofen 200 MG tablet Commonly known as: ADVIL Take 400 mg by mouth every 6 (six) hours as needed for headache.   levothyroxine 75 MCG tablet Commonly known as: SYNTHROID Take 37.5 mcg by mouth daily before breakfast.   LORazepam 2 MG tablet Commonly known as: ATIVAN Take 2 mg by mouth daily as needed for seizure.   medroxyPROGESTERone 150 MG/ML injection Commonly known as: DEPO-PROVERA Inject 150 mg into the muscle every 3 (three) months.   multivitamin with minerals tablet Take 1 tablet by mouth daily.   NON FORMULARY Place 70 mg under the tongue in the morning and at bedtime. CBD oil   T-RELIEF CBD+13 SL Place under the tongue.   ticagrelor 90 MG Tabs tablet Commonly known as: BRILINTA Take 1 tablet (90 mg total) by mouth 2 (two) times daily for 240 doses.   topiramate 200 MG tablet Commonly known as: TOPAMAX Take 1 tablet (200 mg total) by mouth 2 (two) times daily.   vitamin B-12 1000 MCG tablet Commonly known as: CYANOCOBALAMIN Take 1,000 mcg by mouth daily.   vitamin C 1000 MG  tablet Take 1,000 mg  by mouth daily.          Electronically Signed: Joaquim Nam, PA-C 07/04/2021, 8:18 AM   I have spent Greater Than 30 Minutes discharging Disney Ruggiero.

## 2021-07-06 ENCOUNTER — Emergency Department (HOSPITAL_COMMUNITY)
Admission: EM | Admit: 2021-07-06 | Discharge: 2021-07-06 | Disposition: A | Discharge status: Home / Self Care | Payer: Medicare Other | Attending: Emergency Medicine | Admitting: Emergency Medicine

## 2021-07-06 ENCOUNTER — Encounter: Payer: Self-pay | Admitting: Internal Medicine

## 2021-07-06 DIAGNOSIS — Z7902 Long term (current) use of antithrombotics/antiplatelets: Secondary | ICD-10-CM | POA: Insufficient documentation

## 2021-07-06 DIAGNOSIS — R21 Rash and other nonspecific skin eruption: Secondary | ICD-10-CM | POA: Diagnosis present

## 2021-07-06 DIAGNOSIS — T7840XA Allergy, unspecified, initial encounter: Secondary | ICD-10-CM | POA: Diagnosis not present

## 2021-07-06 DIAGNOSIS — Z7982 Long term (current) use of aspirin: Secondary | ICD-10-CM | POA: Diagnosis not present

## 2021-07-06 DIAGNOSIS — Z91040 Latex allergy status: Secondary | ICD-10-CM | POA: Diagnosis not present

## 2021-07-06 MED ORDER — EPINEPHRINE 0.3 MG/0.3ML IJ SOAJ: 0.3000 mg | Freq: Once | INTRAMUSCULAR | Status: DC

## 2021-07-06 MED ORDER — DIPHENHYDRAMINE HCL 25 MG PO CAPS
50.0000 mg | ORAL_CAPSULE | Freq: Once | ORAL | Status: AC
Start: 2021-07-06 — End: 2021-07-06
  Administered 2021-07-06: 50 mg via ORAL
  Filled 2021-07-06: qty 2

## 2021-07-06 MED ORDER — FAMOTIDINE 20 MG PO TABS
40.0000 mg | ORAL_TABLET | Freq: Once | ORAL | Status: AC
  Administered 2021-07-06: 40 mg via ORAL
  Filled 2021-07-06: qty 2

## 2021-07-06 MED ORDER — EPINEPHRINE 0.3 MG/0.3ML IJ SOAJ
INTRAMUSCULAR | Status: AC
  Filled 2021-07-06: qty 0.3

## 2021-07-06 MED ORDER — PREDNISONE 20 MG PO TABS: ORAL_TABLET | ORAL | 0 refills | Status: DC

## 2021-07-06 MED ORDER — PREDNISONE 20 MG PO TABS
60.0000 mg | ORAL_TABLET | Freq: Once | ORAL | Status: AC
  Administered 2021-07-06: 60 mg via ORAL
  Filled 2021-07-06: qty 3

## 2021-07-06 NOTE — Discharge Instructions (Signed)
I think most likely you had an allergic reaction due to the adhesive on the tape that was used for your procedure yesterday.  I prescribed you steroids to take for the next 4 days.  You can take 2 Zyrtec tablets each day to help you with the itching.  Please let your family doctor and the doctor of the perform this procedure know what happened to you so they can keep track of how you are doing.  Please return for difficulty breathing or swallowing or if you start throwing up or having diarrhea or if you feel like you cannot pass out or do pass out.

## 2021-07-06 NOTE — ED Provider Notes (Signed)
[facility] DEPT Provider Note   CSN: [id number] Arrival date & time: 07/06/21  1021     History  Chief Complaint  Patient presents with   Allergic Reaction    Sandra Collins is a 36 y.o. female.  36 yo F with a chief complaints of a itchy rash.  This started on her back and chest and they feel like its been getting worse.  She had a angiogram with a aneurysm clipping done a couple days ago.  Mom noted that she thought she had an allergy to the adhesive and this area had started with a rash got significantly worse.  This morning she felt like her face was now swollen and she was complaining of some difficulty swallowing and so she brought her into the ER for evaluation.  Has had some bruising to the right femoral site where she had access for her procedure.  No history of allergies in the past.  No new soaps or detergents.   Allergic Reaction     Home Medications Prior to Admission medications   Medication Sig Start Date End Date Taking? Authorizing Provider  predniSONE (DELTASONE) 20 MG tablet 3 tabs po daily x 4 days 07/06/21  Yes Deno Etienne, DO  Ascorbic Acid (VITAMIN C) 1000 MG tablet Take 1,000 mg by mouth daily.    [provider]  aspirin EC 81 MG tablet Take 81 mg by mouth daily. Swallow whole.    [provider]  b complex vitamins capsule Take 1 capsule by mouth daily.    [provider]  Biotin 10000 MCG TABS Take 10,000 mcg by mouth daily.    [provider]  Calcium Carb-Cholecalciferol (CALCIUM 600/VITAMIN D PO) Take 1 tablet by mouth daily.    [provider]  cholecalciferol (VITAMIN D3) 25 MCG (1000 UNIT) tablet Take 1,000 Units by mouth daily.    [provider]  clopidogrel (PLAVIX) 75 MG tablet Take 1 tablet (75 mg total) by mouth daily. 06/21/21 10/19/21  Theresa Duty, NP  [employer] F[employer] GUMMY BE[employer] CHILDREN PO Take 2 each by mouth daily as needed (regularity).    [provider]  ibuprofen (ADVIL) 200 MG tablet Take 400 mg by mouth every 6 (six) hours as needed for headache.    [provider]  levothyroxine (SYNTHROID) 75 MCG tablet Take 37.5 mcg by mouth daily before breakfast. 05/27/20   [provider]  LORazepam (ATIVAN) 2 MG tablet Take 2 mg by mouth daily as needed for seizure.    [provider]  medroxyPROGESTERone (DEPO-PROVERA) 150 MG/ML injection Inject 150 mg into the muscle every 3 (three) months. 06/22/20   [provider]  Misc Natural Products (T-RELIEF CBD+13 SL) Place under the tongue.    [provider]  Multiple Vitamins-Minerals (MULTIVITAMIN WITH MINERALS) tablet Take 1 tablet by mouth daily.    [provider]  NON FORMULARY Place 70 mg under the tongue in the morning and at bedtime. CBD oil    [provider]  Omega-3 Fatty Acids (FISH OIL) 1200 MG CPDR Take 1,200 mg by mouth daily. 02/23/20   [provider]  ticagrelor (BRILINTA) 90 MG TABS tablet Take 1 tablet (90 mg total) by mouth 2 (two) times daily for 240 doses. Patient not taking: Reported on 06/27/2021 06/21/21 10/19/21  Theresa Duty, NP  topiramate (TOPAMAX) 200 MG tablet Take 1 tablet (200 mg total) by mouth 2 (two) times daily. 02/14/21   Ellouise Newer  M, MD  vitamin B-12 (CYANOCOBALAMIN) 1000 MCG tablet Take 1,000 mcg by mouth daily.    [provider]      Allergies    Estrogens, Amoxicillin, Latex, Metronidazole, and Pregabalin    Review of Systems   Review of Systems  Physical Exam Updated Vital Signs BP 110/87    Pulse 88    Temp 98.9 F (37.2 C) (Oral)    Resp 18    LMP  (LMP Unknown) Comment: Has not had a cycle in 3 yrs due to the Depo inj.   SpO2 99%  Physical Exam Vitals and nursing note reviewed.  Constitutional:      General: She is not in acute distress.    Appearance: She is well-developed. She is not diaphoretic.     Comments: Erythematous rash more predominantly  on the back than anywhere else.  Spares the anterior chest and abdomen.  HENT:     Head: Normocephalic and atraumatic.     Mouth/Throat:     Comments: Mild erythema in the oropharynx, very strong gag reflex difficulty to visualize past the tongue.  Tolerating secretions without difficulty. Eyes:     Pupils: Pupils are equal, round, and reactive to light.  Cardiovascular:     Rate and Rhythm: Normal rate and regular rhythm.     Heart sounds: No murmur heard.   No friction rub. No gallop.  Pulmonary:     Effort: Pulmonary effort is normal.     Breath sounds: No wheezing or rales.  Abdominal:     General: There is no distension.     Palpations: Abdomen is soft.     Tenderness: There is no abdominal tenderness.  Musculoskeletal:        General: No tenderness.     Cervical back: Normal range of motion and neck supple.  Skin:    General: Skin is warm and dry.  Neurological:     Mental Status: She is alert and oriented to person, place, and time.  Psychiatric:        Behavior: Behavior normal.    ED Results / Procedures / Treatments   Labs (all labs ordered are listed, but only abnormal results are displayed) Labs Reviewed - No data to display  EKG None  Radiology No results found.  Procedures Procedures    Medications Ordered in ED Medications  EPINEPHrine (EPI-PEN) injection 0.3 mg (0 mg Intramuscular Hold 07/06/21 1030)  predniSONE (DELTASONE) tablet 60 mg (60 mg Oral Given 07/06/21 1045)  diphenhydrAMINE (BENADRYL) capsule 50 mg (50 mg Oral Given 07/06/21 1045)  famotidine (PEPCID) tablet 40 mg (40 mg Oral Given 07/06/21 1045)    ED Course/ Medical Decision Making/ A&P                           Medical Decision Making Risk Prescription drug management.   Patient is a 36 y.o. female with a cc of an itchy rash.  This is noted on the face and back primarily.  Going on for a couple days.  She is recently status post cerebral angiogram with a ophthalmic aneurysm  embolization.  Was started on aspirin and Plavix.  No obvious anaphylaxis on arrival.  Will give steroids and antihistamines.  Reassess.     Patient was observed for couple hours in the emergency department.  Has had some improvement from her rash and some improvement in itching.  She was able to swallow here without issue.  Will start  on burst of steroids.  Follow-up with their neurosurgeon as well as their PCP.  12:42 PM:  I have discussed the diagnosis/risks/treatment options with the patient.  Evaluation and diagnostic testing in the emergency department does not suggest an emergent condition requiring admission or immediate intervention beyond what has been performed at this time.  They will follow up with  PCP. We also discussed returning to the ED immediately if new or worsening sx occur. We discussed the sx which are most concerning (e.g., sudden worsening pain, fever, inability to tolerate by mouth) that necessitate immediate return. Medications administered to the patient during their visit and any new prescriptions provided to the patient are listed below.  Medications given during this visit Medications  EPINEPHrine (EPI-PEN) injection 0.3 mg (0 mg Intramuscular Hold 07/06/21 1030)  predniSONE (DELTASONE) tablet 60 mg (60 mg Oral Given 07/06/21 1045)  diphenhydrAMINE (BENADRYL) capsule 50 mg (50 mg Oral Given 07/06/21 1045)  famotidine (PEPCID) tablet 40 mg (40 mg Oral Given 07/06/21 1045)     The patient appears reasonably screen and/or stabilized for discharge and I doubt any other medical condition or other [facility] requiring further screening, evaluation, or treatment in the ED at this time prior to discharge.         Final Clinical Impression(s) / ED Diagnoses Final diagnoses:  Allergic reaction, initial encounter    Rx / DC Orders ED Discharge Orders          Ordered    predniSONE (DELTASONE) 20 MG tablet        07/06/21 [place], [place], DO 07/06/21  1242

## 2021-07-06 NOTE — ED Triage Notes (Signed)
Pt presents with c/o allergic reaction. Pt had surgery on Monday. Pt has been taking medication since the surgery and started experiencing hives earlier in the week. This morning, pt is swelling in her face and ears and is having difficulty breathing, feels like her throat is closing.

## 2021-07-07 ENCOUNTER — Encounter: Payer: Self-pay | Admitting: Internal Medicine

## 2021-07-07 ENCOUNTER — Ambulatory Visit (INDEPENDENT_AMBULATORY_CARE_PROVIDER_SITE_OTHER): Payer: Medicare Other | Admitting: Internal Medicine

## 2021-07-07 ENCOUNTER — Other Ambulatory Visit: Payer: Self-pay

## 2021-07-07 DIAGNOSIS — I671 Cerebral aneurysm, nonruptured: Secondary | ICD-10-CM | POA: Diagnosis not present

## 2021-07-07 MED ORDER — PREDNISONE 20 MG PO TABS: 40.0000 mg | ORAL_TABLET | Freq: Every day | ORAL | 0 refills | Status: DC

## 2021-07-07 NOTE — Patient Instructions (Signed)
Keep taking the prednisone until gone. We have sent in a refill in case this returns.  It is okay to take zyrtec 1 pill 3 times a day to help the itching.

## 2021-07-07 NOTE — Telephone Encounter (Signed)
Pts mother states pts reaction is worsening, she stated pt is possibly allergic to prednisone and pt has taken benadryl, zyrtec, and prednisone as prescribed  Advised her provider does not have any availability today offered her provider's next appt, she declined  Advised caller to take pt to ed, caller states she was told not to come back to ed unless pt is sob or throat is closing

## 2021-07-07 NOTE — Assessment & Plan Note (Addendum)
Will reach out to IR physician to verify dye used as she may have allergy to this which was omnipaque 300. Given facial and throat swelling as part of this reaction I would recommend to avoid or if necessary pre-medicate. It is possible the tape/adhesive was the cause as well and both should be avoided.

## 2021-07-07 NOTE — Progress Notes (Signed)
° °  Subjective:   Patient ID: Sandra Collins, female    DOB: Jan 15, 1986, 36 y.o.   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 They thought it might be the tape used as mom is allergic to this as well. Went to ED due to facial swelling and throat swelling. Given prednisone and benadryl and this helped some. Significant facial swelling still today. Rash on whole body is decreasing slightly.   Review of Systems  Constitutional: Negative.   HENT:  Positive for facial swelling.   Eyes: Negative.   Respiratory:  Negative for cough, chest tightness and shortness of breath.   Cardiovascular:  Negative for chest pain, palpitations and leg swelling.  Gastrointestinal:  Negative for abdominal distention, abdominal pain, constipation, diarrhea, nausea and vomiting.  Musculoskeletal: Negative.   Skin:  Positive for rash.  Neurological: Negative.   Psychiatric/Behavioral: Negative.     Objective:  Physical Exam Constitutional:      Appearance: She is well-developed.  HENT:     Head: Normocephalic and atraumatic.     Comments: Facial and periorbital swelling, no tongue or oropharynx swelling Cardiovascular:     Rate and Rhythm: Normal rate and regular rhythm.  Pulmonary:     Effort: Pulmonary effort is normal. No respiratory distress.     Breath sounds: Normal breath sounds. No wheezing or rales.  Abdominal:     General: Bowel sounds are normal. There is no distension.     Palpations: Abdomen is soft.     Tenderness: There is no abdominal tenderness. There is no rebound.  Musculoskeletal:     Cervical back: Normal range of motion.  Skin:    General: Skin is warm and dry.     Findings: Rash present.  Neurological:     Mental Status: She is alert and oriented to person, place, and time.     Coordination: Coordination normal.    Vitals:   07/07/21 1524  BP: 126/74  Pulse: 96  Resp: 18  SpO2: 100%  Weight: 206 lb 6.4 oz (93.6  kg)  Height: 5\' 3"  (1.6 m)   This visit occurred during the SARS-CoV-2 public health emergency.  Safety protocols were in place, including screening questions prior to the visit, additional usage of staff PPE, and extensive cleaning of exam room while observing appropriate contact time as indicated for disinfecting solutions.   Assessment & Plan:  Visit time 25 minutes in face to face communication with patient and coordination of care, additional 5 minutes spent in record review, coordination or care, ordering tests, communicating/referring to other healthcare professionals, documenting in medical records all on the same day of the visit for total time 30 minutes spent on the visit.

## 2021-07-11 ENCOUNTER — Telehealth (HOSPITAL_COMMUNITY): Payer: Self-pay

## 2021-07-11 NOTE — Telephone Encounter (Signed)
Called to schedule 2 wk f/u, no answer, left vm. AW 

## 2021-07-24 ENCOUNTER — Ambulatory Visit (HOSPITAL_COMMUNITY)
Admission: RE | Admit: 2021-07-24 | Discharge: 2021-07-24 | Disposition: A | Discharge status: Home / Self Care | Payer: Medicare Other | Source: Ambulatory Visit | Attending: Physician Assistant | Admitting: Physician Assistant

## 2021-07-24 ENCOUNTER — Other Ambulatory Visit: Payer: Self-pay

## 2021-07-24 DIAGNOSIS — I671 Cerebral aneurysm, nonruptured: Secondary | ICD-10-CM

## 2021-07-25 HISTORY — PX: IR RADIOLOGIST EVAL & MGMT: IMG5224

## 2021-07-31 ENCOUNTER — Ambulatory Visit (INDEPENDENT_AMBULATORY_CARE_PROVIDER_SITE_OTHER): Payer: Medicare Other | Admitting: Internal Medicine

## 2021-07-31 ENCOUNTER — Other Ambulatory Visit: Payer: Self-pay

## 2021-07-31 VITALS — BP 110/82 | HR 100 | Temp 98.4°F | Wt 206.5 lb

## 2021-07-31 DIAGNOSIS — Z6836 Body mass index (BMI) 36.0-36.9, adult: Secondary | ICD-10-CM

## 2021-07-31 DIAGNOSIS — R569 Unspecified convulsions: Secondary | ICD-10-CM | POA: Diagnosis not present

## 2021-07-31 DIAGNOSIS — E890 Postprocedural hypothyroidism: Secondary | ICD-10-CM

## 2021-07-31 DIAGNOSIS — E669 Obesity, unspecified: Secondary | ICD-10-CM | POA: Diagnosis not present

## 2021-07-31 DIAGNOSIS — E7800 Pure hypercholesterolemia, unspecified: Secondary | ICD-10-CM

## 2021-07-31 DIAGNOSIS — Z3009 Encounter for other general counseling and advice on contraception: Secondary | ICD-10-CM

## 2021-07-31 MED ORDER — MEDROXYPROGESTERONE ACETATE 150 MG/ML IM SUSP: 150.0000 mg | INTRAMUSCULAR | 3 refills | Status: DC

## 2021-07-31 NOTE — Patient Instructions (Signed)
I sent in the depo-provera.  ? ? ?

## 2021-07-31 NOTE — Progress Notes (Signed)
? ?  Subjective:  ? ?Patient ID: Sandra Collins, female    DOB: May 09, 1986, 36 y.o.   MRN: [id number] ? ?HPI ?The patient is a 36 YO female coming in for hormonal issues. Some higher temp and this almost triggered a seizure.  ? ?Review of Systems  ?Constitutional: Negative.   ?HENT: Negative.    ?Eyes: Negative.   ?Respiratory:  Negative for cough, chest tightness and shortness of breath.   ?Cardiovascular:  Negative for chest pain, palpitations and leg swelling.  ?Gastrointestinal:  Negative for abdominal distention, abdominal pain, constipation, diarrhea, nausea and vomiting.  ?Musculoskeletal: Negative.   ?Skin: Negative.   ?Neurological: Negative.   ?Psychiatric/Behavioral: Negative.    ? ?Objective:  ?Physical Exam ?Constitutional:   ?   Appearance: She is well-developed.  ?HENT:  ?   Head: Normocephalic and atraumatic.  ?Cardiovascular:  ?   Rate and Rhythm: Normal rate and regular rhythm.  ?Pulmonary:  ?   Effort: Pulmonary effort is normal. No respiratory distress.  ?   Breath sounds: Normal breath sounds. No wheezing or rales.  ?Abdominal:  ?   General: Bowel sounds are normal. There is no distension.  ?   Palpations: Abdomen is soft.  ?   Tenderness: There is no abdominal tenderness. There is no rebound.  ?Musculoskeletal:  ?   Cervical back: Normal range of motion.  ?Skin: ?   General: Skin is warm and dry.  ?Neurological:  ?   Mental Status: She is alert and oriented to person, place, and time.  ?   Coordination: Coordination normal.  ? ? ?Vitals:  ? 07/31/21 1008  ?BP: 110/82  ?Pulse: 100  ?Temp: 98.4 ?F (36.9 ?C)  ?TempSrc: Oral  ?SpO2: 97%  ?Weight: 206 lb 8 oz (93.7 kg)  ? ? ?This visit occurred during the SARS-CoV-2 public health emergency.  Safety protocols were in place, including screening questions prior to the visit, additional usage of staff PPE, and extensive cleaning of exam room while observing appropriate contact time as indicated for disinfecting solutions.  ? ?Assessment & Plan:  ? ?

## 2021-08-01 ENCOUNTER — Encounter: Payer: Self-pay | Admitting: Internal Medicine

## 2021-08-01 DIAGNOSIS — Z3009 Encounter for other general counseling and advice on contraception: Secondary | ICD-10-CM | POA: Insufficient documentation

## 2021-08-01 NOTE — Assessment & Plan Note (Signed)
She will continue on depo-provera q 3 months and we talked about how recent procedure and steroid course due to allergic reaction could have caused the hormonal changes. She dose have stimulator and abortive medication for seizure if needed.  ?

## 2021-08-01 NOTE — Assessment & Plan Note (Signed)
Rx depo-provera to do at home every 3 months. Patient and mom are aware of how to do this and have administered at home for several years. She is using this for hormonal/cycle suppression due to ovulation temp changes triggering seizures. She has not missed any doses, last Jan 2023 and due April 2023. ?

## 2021-08-01 NOTE — Assessment & Plan Note (Signed)
Checking TSH and free T4 today and adjust if needed 37.5 mcg daily synthroid. ?

## 2021-08-01 NOTE — Assessment & Plan Note (Signed)
Checking lipid panel for monitoring. Adjust if needed not on medication. ?

## 2021-08-07 ENCOUNTER — Telehealth: Payer: Self-pay

## 2021-08-07 NOTE — Telephone Encounter (Signed)
Spoke with pt mother after pt had an reaction after she had a surgery for an aneurysm, pt is doing great she was given steroids as well as creams, pt mother stated that she is still on a blood thinner once she is off the blood thinner she will call back to get her referral placed for her VNS battery changed, Dr Delice Lesch was advised  ?

## 2021-08-16 ENCOUNTER — Telehealth: Payer: Self-pay | Admitting: Neurology

## 2021-08-16 NOTE — Telephone Encounter (Signed)
The VNS battery replacement surgeon Dr. Delice Lesch recommended is ok with the pt. She will also be off of her blood thinner this Sunday.  ?

## 2021-08-18 NOTE — Telephone Encounter (Signed)
Pt called no answer left a voce mail per DPR that we have started the referral for VNS battery replacement if she has any questions she can call me back at [phone] ?

## 2021-08-18 NOTE — Telephone Encounter (Signed)
Pls let her know we will send the referral and get the process going, thanks ?

## 2021-08-25 ENCOUNTER — Telehealth: Payer: Self-pay | Admitting: Neurology

## 2021-08-25 NOTE — Telephone Encounter (Signed)
Dr Maximiano Coss office called asking for notes and demographics for this patients referral that was sent to their office.  Please send to [facility] fax number [phone]. ?

## 2021-08-25 NOTE — Telephone Encounter (Signed)
Office notes sent to [facility] at [phone] as requested for her referral  ?

## 2021-09-06 ENCOUNTER — Telehealth: Payer: Self-pay | Admitting: Neurology

## 2021-09-06 NOTE — Telephone Encounter (Signed)
Pt called in stating the Xyal she is taking for her allergies is not working. She would like to see if she can take Pseudoephedrine Sulfate USP 240 mg, nasal decongestant. It's the Encinal brand equivalent to Claritin D. Wants to make sure it's ok to take with her seizures. Knows Dr. Delice Lesch will be out until Monday. ?

## 2021-09-06 NOTE — Telephone Encounter (Signed)
Pls let her know they are relatively safe, the main thing to avoid would be Benadryl (Diphenhydramine). Thanks ?

## 2021-09-07 NOTE — Telephone Encounter (Signed)
Called patient and let her know Dr. Delice Lesch recommendations to stay away from Benadryl ( Diphenhydramine) Patient did let me know she had been taking tat at night time. I did tell patient to discontinue it and to stay clear of anything with that in the medication ?

## 2021-09-12 ENCOUNTER — Other Ambulatory Visit: Payer: Self-pay | Admitting: Neurosurgery

## 2021-09-18 ENCOUNTER — Telehealth: Payer: Self-pay | Admitting: Internal Medicine

## 2021-09-18 MED ORDER — LEVOTHYROXINE SODIUM 75 MCG PO TABS: 37.5000 ug | ORAL_TABLET | Freq: Every day | ORAL | 0 refills | Status: DC

## 2021-09-18 NOTE — Telephone Encounter (Signed)
Ok to refill 

## 2021-09-18 NOTE — Telephone Encounter (Signed)
1.Medication Requested: levothyroxine (SYNTHROID) 75 MCG tablet ? ?2. Pharmacy (Name, [facility]): [facility] [place], [address] AT [place] [address] [place] ? ?3. On Med List: Y ? ?4. Last Visit with PCP: 07-31-2021 ? ?5. Next visit date with PCP: n/a ? ? ?Agent: Please be advised that RX refills may take up to 3 business days. We ask that you follow-up with your pharmacy.  ?

## 2021-09-18 NOTE — Telephone Encounter (Signed)
Refill has been sent to the pt's pharmacy  

## 2021-09-18 NOTE — Progress Notes (Signed)
Surgical Instructions ? ? ? Your procedure is scheduled on 09/25/21. ? Report to [facility] Main Entrance "A" at 5:30 A.M., then check in with the Admitting office. ? Call this number if you have problems the morning of surgery: ? [phone] ? ? If you have any questions prior to your surgery date call [phone]: Open Monday-Friday 8am-4pm ? ? ? Remember: ? Do not eat or drink after midnight the night before your surgery ? ?  ? Take these medicines the morning of surgery with A SIP OF WATER:  ?aspirin EC ?levothyroxine (SYNTHROID)  ?topiramate (TOPAMAX)  ? ?IF NEEDED: ?LORazepam (ATIVAN)  ? ?As of today, STOP taking any (unless otherwise instructed by your surgeon) Aleve, Naproxen, Ibuprofen, Motrin, Advil, Goody's, BC's, all herbal medications, fish oil, and all vitamins. ? ?         ?Do not wear jewelry or makeup ?Do not wear lotions, powders, perfumes/colognes, or deodorant. ?Do not shave 48 hours prior to surgery.  ?Do not bring valuables to the hospital. ?Do not wear nail polish, gel polish, artificial nails, or any other type of covering on natural nails (fingers and toes) ?If you have artificial nails or gel coating that need to be removed by a nail salon, please have this removed prior to surgery. Artificial nails or gel coating may interfere with anesthesia's ability to adequately monitor your vital signs. ? ?[facility] is not responsible for any belongings or valuables. .  ? ?Do NOT Smoke (Tobacco/Vaping)  24 hours prior to your procedure ? ?If you use a CPAP at night, you may bring your mask for your overnight stay. ?  ?Contacts, glasses, hearing aids, dentures or partials may not be worn into surgery, please bring cases for these belongings ?  ?For patients admitted to the hospital, discharge time will be determined by your treatment team. ?  ?Patients discharged the day of surgery will not be allowed to drive home, and someone needs to stay with them for 24 hours. ? ? ?SURGICAL WAITING ROOM  VISITATION ?Patients having surgery or a procedure in a hospital may have two support people. ?Children under the age of 28 must have an adult with them who is not the patient. ?They may stay in the waiting area during the procedure and may switch out with other visitors. If the patient needs to stay at the hospital during part of their recovery, the visitor guidelines for inpatient rooms apply. ? ?Please refer to the [facility] website for the visitor guidelines for Inpatients (after your surgery is over and you are in a regular room).  ? ? ? ? ? ?Special instructions:   ? ?Oral Hygiene is also important to reduce your risk of infection.  Remember - BRUSH YOUR TEETH THE MORNING OF SURGERY WITH YOUR REGULAR TOOTHPASTE ? ? ?[facility]- Preparing For Surgery ? ?Before surgery, you can play an important role. Because skin is not sterile, your skin needs to be as free of germs as possible. You can reduce the number of germs on your skin by washing with CHG (chlorahexidine gluconate) Soap before surgery.  CHG is an antiseptic cleaner which kills germs and bonds with the skin to continue killing germs even after washing.   ? ? ?Please do not use if you have an allergy to CHG or antibacterial soaps. If your skin becomes reddened/irritated stop using the CHG.  ?Do not shave (including legs and underarms) for at least 48 hours prior to first CHG shower. It is OK to  shave your face. ? ?Please follow these instructions carefully. ?  ? ? Shower the NIGHT BEFORE SURGERY and the MORNING OF SURGERY with CHG Soap.  ? If you chose to wash your hair, wash your hair first as usual with your normal shampoo. After you shampoo, rinse your hair and body thoroughly to remove the shampoo.  Then [employer] and genitals (private parts) with your normal soap and rinse thoroughly to remove soap. ? ?After that Use CHG Soap as you would any other liquid soap. You can apply CHG directly to the skin and wash gently with a scrungie or a clean  washcloth.  ? ?Apply the CHG Soap to your body ONLY FROM THE NECK DOWN.  Do not use on open wounds or open sores. Avoid contact with your eyes, ears, mouth and genitals (private parts). Wash Face and genitals (private parts)  with your normal soap.  ? ?Wash thoroughly, paying special attention to the area where your surgery will be performed. ? ?Thoroughly rinse your body with warm water from the neck down. ? ?DO NOT shower/wash with your normal soap after using and rinsing off the CHG Soap. ? ?Pat yourself dry with a CLEAN TOWEL. ? ?Wear CLEAN PAJAMAS to bed the night before surgery ? ?Place CLEAN SHEETS on your bed the night before your surgery ? ?DO NOT SLEEP WITH PETS. ? ? ?Day of Surgery: ?Take a shower with CHG soap. ?Wear Clean/Comfortable clothing the morning of surgery ?Do not apply any deodorants/lotions.   ?Remember to brush your teeth WITH YOUR REGULAR TOOTHPASTE. ? ? ? ?If you received a COVID test during your pre-op visit, it is requested that you wear a mask when out in public, stay away from anyone that may not be feeling well, and notify your surgeon if you develop symptoms. If you have been in contact with anyone that has tested positive in the last 10 days, please notify your surgeon. ? ?  ?Please read over the following fact sheets that you were given.  ? ?

## 2021-09-19 ENCOUNTER — Encounter (HOSPITAL_COMMUNITY): Payer: Self-pay

## 2021-09-19 ENCOUNTER — Other Ambulatory Visit: Payer: Self-pay

## 2021-09-19 ENCOUNTER — Encounter (HOSPITAL_COMMUNITY)
Admission: RE | Admit: 2021-09-19 | Discharge: 2021-09-19 | Disposition: A | Discharge status: Home / Self Care | Payer: Medicare Other | Source: Ambulatory Visit | Attending: Neurosurgery | Admitting: Neurosurgery

## 2021-09-19 VITALS — BP 127/90 | HR 95 | Temp 97.7°F | Resp 18 | Ht 63.0 in | Wt 209.1 lb

## 2021-09-19 DIAGNOSIS — Z01818 Encounter for other preprocedural examination: Secondary | ICD-10-CM | POA: Diagnosis not present

## 2021-09-19 HISTORY — DX: Atherosclerotic heart disease of native coronary artery without angina pectoris: I25.10

## 2021-09-19 HISTORY — DX: Hypothyroidism, unspecified: E03.9

## 2021-09-19 HISTORY — DX: Hyperlipidemia, unspecified: E78.5

## 2021-09-19 HISTORY — DX: Headache, unspecified: R51.9

## 2021-09-19 LAB — CBC
HCT: 43.2 % (ref 36.0–46.0)
Hemoglobin: 14.3 g/dL (ref 12.0–15.0)
MCH: 31.4 pg (ref 26.0–34.0)
MCHC: 33.1 g/dL (ref 30.0–36.0)
MCV: 94.9 fL (ref 80.0–100.0)
Platelets: 215 10*3/uL (ref 150–400)
RBC: 4.55 MIL/uL (ref 3.87–5.11)
RDW: 11.9 % (ref 11.5–15.5)
WBC: 6.8 10*3/uL (ref 4.0–10.5)
nRBC: 0 % (ref 0.0–0.2)

## 2021-09-19 LAB — BASIC METABOLIC PANEL
Anion gap: 7 (ref 5–15)
BUN: 13 mg/dL (ref 6–20)
CO2: 19 mmol/L — ABNORMAL LOW (ref 22–32)
Calcium: 9.3 mg/dL (ref 8.9–10.3)
Chloride: 113 mmol/L — ABNORMAL HIGH (ref 98–111)
Creatinine, Ser: 1.03 mg/dL — ABNORMAL HIGH (ref 0.44–1.00)
GFR, Estimated: >60 mL/min (ref >60)
Glucose, Bld: 91 mg/dL (ref 70–99)
Potassium: 3.3 mmol/L — ABNORMAL LOW (ref 3.5–5.1)
Sodium: 139 mmol/L (ref 135–145)

## 2021-09-19 NOTE — Progress Notes (Addendum)
PCP - Real Cons. Crawford MD ?Cardiologist - none ?Neurologist- Ellouise Newer MD ? ?PPM/ICD - denies ?Device Orders -  ?Rep Notified -  ? ?Chest x-ray - n/a ?EKG - 09/19/21 ?Stress Test - no ?ECHO - none ?Cardiac Cath - no ? ?Sleep Study - none ?CPAP -  ? ?Fasting Blood Sugar - n/a ?Checks Blood Sugar _____ times a day ? ?Blood Thinner Instructions:Pt states she is not taking Plavix anymore ?Aspirin Instructions: Pt states that Dr. Kathyrn Sheriff told her to continue taking Aspirin. ? ?ERAS Protcol -no ?PRE-SURGERY Ensure or G2-  ? ?COVID TEST- n/a ? ? ?Anesthesia review: yes-abnormal EKG. Pt very nervous during appointment and she was upset about having EKG electrodes on her legs which might have affected her EKG.  She denied pain or SOB.  ? ?Patient denies shortness of breath, fever, cough and chest pain at PAT appointment ? ? ?All instructions explained to the patient, with a verbal understanding of the material. Patient agrees to go over the instructions while at home for a better understanding. Patient also instructed to wear a mask when out in public prior to surgery. The opportunity to ask questions was provided. ?  ?

## 2021-09-20 ENCOUNTER — Encounter (HOSPITAL_COMMUNITY): Payer: Self-pay

## 2021-09-25 ENCOUNTER — Encounter (HOSPITAL_COMMUNITY): Payer: Self-pay | Admitting: Neurosurgery

## 2021-09-25 ENCOUNTER — Other Ambulatory Visit: Payer: Self-pay

## 2021-09-25 ENCOUNTER — Ambulatory Visit (HOSPITAL_COMMUNITY): Payer: Medicare Other | Admitting: Physician Assistant

## 2021-09-25 ENCOUNTER — Ambulatory Visit (HOSPITAL_COMMUNITY)
Admission: RE | Admit: 2021-09-25 | Discharge: 2021-09-25 | Disposition: A | Discharge status: Home / Self Care | Payer: Medicare Other | Attending: Neurosurgery | Admitting: Neurosurgery

## 2021-09-25 ENCOUNTER — Ambulatory Visit (HOSPITAL_BASED_OUTPATIENT_CLINIC_OR_DEPARTMENT_OTHER): Payer: Medicare Other | Admitting: Certified Registered Nurse Anesthetist

## 2021-09-25 ENCOUNTER — Encounter (HOSPITAL_COMMUNITY)
Admission: RE | Disposition: A | Discharge status: Home / Self Care | Payer: Self-pay | Source: Home / Self Care | Attending: Neurosurgery

## 2021-09-25 DIAGNOSIS — Z4542 Encounter for adjustment and management of neuropacemaker (brain) (peripheral nerve) (spinal cord): Secondary | ICD-10-CM | POA: Diagnosis not present

## 2021-09-25 DIAGNOSIS — E039 Hypothyroidism, unspecified: Secondary | ICD-10-CM

## 2021-09-25 DIAGNOSIS — F418 Other specified anxiety disorders: Secondary | ICD-10-CM

## 2021-09-25 DIAGNOSIS — F419 Anxiety disorder, unspecified: Secondary | ICD-10-CM | POA: Insufficient documentation

## 2021-09-25 DIAGNOSIS — G40919 Epilepsy, unspecified, intractable, without status epilepticus: Secondary | ICD-10-CM | POA: Diagnosis not present

## 2021-09-25 HISTORY — PX: VAGUS NERVE STIMULATOR INSERTION: SHX348

## 2021-09-25 LAB — POCT PREGNANCY, URINE: Preg Test, Ur: NEGATIVE

## 2021-09-25 SURGERY — VAGAL NERVE STIMULATOR IMPLANT
Anesthesia: General | Site: Chest | Laterality: Left

## 2021-09-25 MED ORDER — FENTANYL CITRATE (PF) 250 MCG/5ML IJ SOLN
INTRAMUSCULAR | Status: AC
  Filled 2021-09-25: qty 5

## 2021-09-25 MED ORDER — KETOROLAC TROMETHAMINE 30 MG/ML IJ SOLN
INTRAMUSCULAR | Status: DC | PRN
Start: 2021-09-25 — End: 2021-09-25
  Administered 2021-09-25: 30 mg via INTRAVENOUS

## 2021-09-25 MED ORDER — LIDOCAINE 2% (20 MG/ML) 5 ML SYRINGE
INTRAMUSCULAR | Status: AC
  Filled 2021-09-25: qty 5

## 2021-09-25 MED ORDER — OXYCODONE HCL 5 MG PO TABS: 5.0000 mg | ORAL_TABLET | Freq: Once | ORAL | Status: DC | PRN

## 2021-09-25 MED ORDER — MIDAZOLAM HCL 2 MG/2ML IJ SOLN
INTRAMUSCULAR | Status: DC | PRN
  Administered 2021-09-25: 2 mg via INTRAVENOUS

## 2021-09-25 MED ORDER — PROPOFOL 10 MG/ML IV BOLUS
INTRAVENOUS | Status: DC | PRN
  Administered 2021-09-25: 200 mg via INTRAVENOUS

## 2021-09-25 MED ORDER — VANCOMYCIN HCL IN DEXTROSE 1-5 GM/200ML-% IV SOLN
1000.0000 mg | INTRAVENOUS | Status: AC
Start: 2021-09-25 — End: 2021-09-25
  Administered 2021-09-25: 1000 mg via INTRAVENOUS
  Filled 2021-09-25: qty 200

## 2021-09-25 MED ORDER — ROCURONIUM BROMIDE 10 MG/ML (PF) SYRINGE
PREFILLED_SYRINGE | INTRAVENOUS | Status: AC
  Filled 2021-09-25: qty 10

## 2021-09-25 MED ORDER — DEXAMETHASONE SODIUM PHOSPHATE 10 MG/ML IJ SOLN
INTRAMUSCULAR | Status: AC
  Filled 2021-09-25: qty 1

## 2021-09-25 MED ORDER — FENTANYL CITRATE (PF) 100 MCG/2ML IJ SOLN: 25.0000 ug | INTRAMUSCULAR | Status: DC | PRN

## 2021-09-25 MED ORDER — LIDOCAINE-EPINEPHRINE 1 %-1:100000 IJ SOLN
INTRAMUSCULAR | Status: AC
  Filled 2021-09-25: qty 1

## 2021-09-25 MED ORDER — DEXAMETHASONE SODIUM PHOSPHATE 10 MG/ML IJ SOLN
INTRAMUSCULAR | Status: DC | PRN
Start: 2021-09-25 — End: 2021-09-25
  Administered 2021-09-25: 8 mg via INTRAVENOUS

## 2021-09-25 MED ORDER — SUGAMMADEX SODIUM 200 MG/2ML IV SOLN
INTRAVENOUS | Status: DC | PRN
Start: 2021-09-25 — End: 2021-09-25
  Administered 2021-09-25: 200 mg via INTRAVENOUS

## 2021-09-25 MED ORDER — ACETAMINOPHEN 10 MG/ML IV SOLN: 1000.0000 mg | Freq: Once | INTRAVENOUS | Status: DC | PRN

## 2021-09-25 MED ORDER — CHLORHEXIDINE GLUCONATE CLOTH 2 % EX PADS: 6.0000 | MEDICATED_PAD | Freq: Once | CUTANEOUS | Status: DC

## 2021-09-25 MED ORDER — BUPIVACAINE HCL (PF) 0.5 % IJ SOLN
INTRAMUSCULAR | Status: AC
  Filled 2021-09-25: qty 30

## 2021-09-25 MED ORDER — ONDANSETRON HCL 4 MG/2ML IJ SOLN
INTRAMUSCULAR | Status: AC
  Filled 2021-09-25: qty 2

## 2021-09-25 MED ORDER — ORAL CARE MOUTH RINSE: 15.0000 mL | Freq: Once | OROMUCOSAL | Status: AC

## 2021-09-25 MED ORDER — THROMBIN 5000 UNITS EX SOLR
CUTANEOUS | Status: AC
  Filled 2021-09-25: qty 5000

## 2021-09-25 MED ORDER — ROCURONIUM BROMIDE 10 MG/ML (PF) SYRINGE
PREFILLED_SYRINGE | INTRAVENOUS | Status: DC | PRN
  Administered 2021-09-25: 80 mg via INTRAVENOUS

## 2021-09-25 MED ORDER — PROPOFOL 10 MG/ML IV BOLUS
INTRAVENOUS | Status: AC
  Filled 2021-09-25: qty 20

## 2021-09-25 MED ORDER — ACETAMINOPHEN 10 MG/ML IV SOLN
INTRAVENOUS | Status: AC
  Filled 2021-09-25: qty 100

## 2021-09-25 MED ORDER — ACETAMINOPHEN 10 MG/ML IV SOLN
INTRAVENOUS | Status: DC | PRN
  Administered 2021-09-25: 1000 mg via INTRAVENOUS

## 2021-09-25 MED ORDER — PHENYLEPHRINE 80 MCG/ML (10ML) SYRINGE FOR IV PUSH (FOR BLOOD PRESSURE SUPPORT)
PREFILLED_SYRINGE | INTRAVENOUS | Status: AC
  Filled 2021-09-25: qty 10

## 2021-09-25 MED ORDER — AMISULPRIDE (ANTIEMETIC) 5 MG/2ML IV SOLN
INTRAVENOUS | Status: AC
  Filled 2021-09-25: qty 4

## 2021-09-25 MED ORDER — HYDROCODONE-ACETAMINOPHEN 5-325 MG PO TABS: 1.0000 | ORAL_TABLET | Freq: Three times a day (TID) | ORAL | 0 refills | Status: AC | PRN

## 2021-09-25 MED ORDER — PHENYLEPHRINE 80 MCG/ML (10ML) SYRINGE FOR IV PUSH (FOR BLOOD PRESSURE SUPPORT)
PREFILLED_SYRINGE | INTRAVENOUS | Status: DC | PRN
Start: 2021-09-25 — End: 2021-09-25
  Administered 2021-09-25: 80 ug via INTRAVENOUS

## 2021-09-25 MED ORDER — ACETAMINOPHEN 160 MG/5ML PO SOLN: 1000.0000 mg | Freq: Once | ORAL | Status: DC | PRN

## 2021-09-25 MED ORDER — CHLORHEXIDINE GLUCONATE 0.12 % MT SOLN
15.0000 mL | Freq: Once | OROMUCOSAL | Status: AC
  Administered 2021-09-25: 15 mL via OROMUCOSAL
  Filled 2021-09-25: qty 15

## 2021-09-25 MED ORDER — MIDAZOLAM HCL 2 MG/2ML IJ SOLN
INTRAMUSCULAR | Status: AC
  Filled 2021-09-25: qty 2

## 2021-09-25 MED ORDER — ACETAMINOPHEN 500 MG PO TABS: 1000.0000 mg | ORAL_TABLET | Freq: Once | ORAL | Status: DC | PRN

## 2021-09-25 MED ORDER — LIDOCAINE HCL (CARDIAC) PF 100 MG/5ML IV SOSY
PREFILLED_SYRINGE | INTRAVENOUS | Status: DC | PRN
  Administered 2021-09-25: 60 mg via INTRATRACHEAL

## 2021-09-25 MED ORDER — LACTATED RINGERS IV SOLN: INTRAVENOUS | Status: DC

## 2021-09-25 MED ORDER — FENTANYL CITRATE (PF) 250 MCG/5ML IJ SOLN
INTRAMUSCULAR | Status: DC | PRN
  Administered 2021-09-25: 100 ug via INTRAVENOUS

## 2021-09-25 MED ORDER — ONDANSETRON HCL 4 MG/2ML IJ SOLN
INTRAMUSCULAR | Status: DC | PRN
  Administered 2021-09-25: 4 mg via INTRAVENOUS

## 2021-09-25 MED ORDER — 0.9 % SODIUM CHLORIDE (POUR BTL) OPTIME
TOPICAL | Status: DC | PRN
  Administered 2021-09-25: 1000 mL

## 2021-09-25 MED ORDER — AMISULPRIDE (ANTIEMETIC) 5 MG/2ML IV SOLN
10.0000 mg | Freq: Once | INTRAVENOUS | Status: AC
  Administered 2021-09-25: 10 mg via INTRAVENOUS

## 2021-09-25 MED ORDER — OXYCODONE HCL 5 MG/5ML PO SOLN: 5.0000 mg | Freq: Once | ORAL | Status: DC | PRN

## 2021-09-25 MED ORDER — LIDOCAINE-EPINEPHRINE 1 %-1:100000 IJ SOLN
INTRAMUSCULAR | Status: DC | PRN
  Administered 2021-09-25: 8 mL

## 2021-09-25 MED ORDER — THROMBIN 5000 UNITS EX SOLR: OROMUCOSAL | Status: DC | PRN

## 2021-09-25 SURGICAL SUPPLY — 51 items
BAG COUNTER SPONGE SURGICOUNT (BAG) ×2 IMPLANT
BENZOIN TINCTURE PRP APPL 2/3 (GAUZE/BANDAGES/DRESSINGS) IMPLANT
BLADE CLIPPER SURG (BLADE) IMPLANT
BLADE SURG 11 STRL SS (BLADE) ×2 IMPLANT
CANISTER SUCT 3000ML PPV (MISCELLANEOUS) ×2 IMPLANT
CARTRIDGE OIL MAESTRO DRILL (MISCELLANEOUS) IMPLANT
DECANTER SPIKE VIAL GLASS SM (MISCELLANEOUS) ×1 IMPLANT
DERMABOND ADVANCED (GAUZE/BANDAGES/DRESSINGS) ×1
DERMABOND ADVANCED .7 DNX12 (GAUZE/BANDAGES/DRESSINGS) ×1 IMPLANT
DIFFUSER DRILL AIR PNEUMATIC (MISCELLANEOUS) IMPLANT
DRAPE CAMERA VIDEO/LASER (DRAPES) ×2 IMPLANT
DRAPE HALF SHEET 40X57 (DRAPES) IMPLANT
DRAPE LAPAROTOMY 100X72 PEDS (DRAPES) ×2 IMPLANT
DRSG OPSITE POSTOP 3X4 (GAUZE/BANDAGES/DRESSINGS) ×1 IMPLANT
DRSG TEGADERM 4X4.75 (GAUZE/BANDAGES/DRESSINGS) ×5 IMPLANT
DURAPREP 6ML APPLICATOR 50/CS (WOUND CARE) ×2 IMPLANT
ELECT COATED BLADE 2.86 ST (ELECTRODE) ×3 IMPLANT
ELECT REM PT RETURN 9FT ADLT (ELECTROSURGICAL) ×2
ELECTRODE REM PT RTRN 9FT ADLT (ELECTROSURGICAL) ×1 IMPLANT
GAUZE 4X4 16PLY ~~LOC~~+RFID DBL (SPONGE) IMPLANT
GENERATOR MODEL 106 ASPIRE (Neuro Prosthesis/Implant) ×1 IMPLANT
GLOVE BIO SURGEON STRL SZ7.5 (GLOVE) ×2 IMPLANT
GLOVE BIOGEL PI IND STRL 7.5 (GLOVE) ×2 IMPLANT
GLOVE BIOGEL PI INDICATOR 7.5 (GLOVE)
GLOVE ECLIPSE 7.0 STRL STRAW (GLOVE) ×1 IMPLANT
GOWN STRL REUS W/ TWL LRG LVL3 (GOWN DISPOSABLE) ×2 IMPLANT
GOWN STRL REUS W/ TWL XL LVL3 (GOWN DISPOSABLE) IMPLANT
GOWN STRL REUS W/TWL 2XL LVL3 (GOWN DISPOSABLE) IMPLANT
GOWN STRL REUS W/TWL LRG LVL3 (GOWN DISPOSABLE) ×2
GOWN STRL REUS W/TWL XL LVL3 (GOWN DISPOSABLE)
HEMOSTAT POWDER KIT SURGIFOAM (HEMOSTASIS) ×2 IMPLANT
KIT BASIN OR (CUSTOM PROCEDURE TRAY) ×2 IMPLANT
KIT TURNOVER KIT B (KITS) ×2 IMPLANT
LOOP VESSEL MAXI BLUE (MISCELLANEOUS) IMPLANT
LOOP VESSEL MINI RED (MISCELLANEOUS) IMPLANT
NEEDLE HYPO 22GX1.5 SAFETY (NEEDLE) ×3 IMPLANT
NS IRRIG 1000ML POUR BTL (IV SOLUTION) ×2 IMPLANT
OIL CARTRIDGE MAESTRO DRILL (MISCELLANEOUS)
PACK LAMINECTOMY NEURO (CUSTOM PROCEDURE TRAY) ×2 IMPLANT
PAD ARMBOARD 7.5X6 YLW CONV (MISCELLANEOUS) ×7 IMPLANT
SPONGE INTESTINAL PEANUT (DISPOSABLE) IMPLANT
SPONGE SURGIFOAM ABS GEL SZ50 (HEMOSTASIS) IMPLANT
SUT ETHILON 3 0 FSL (SUTURE) IMPLANT
SUT NURALON 4 0 TR CR/8 (SUTURE) ×1 IMPLANT
SUT VIC AB 0 CT1 27 (SUTURE) ×1
SUT VIC AB 0 CT1 27XBRD ANBCTR (SUTURE) ×1 IMPLANT
SUT VIC AB 3-0 SH 8-18 (SUTURE) ×2 IMPLANT
SUT VICRYL 3-0 RB1 18 ABS (SUTURE) ×4 IMPLANT
TOWEL GREEN STERILE (TOWEL DISPOSABLE) ×2 IMPLANT
TOWEL GREEN STERILE FF (TOWEL DISPOSABLE) ×2 IMPLANT
WATER STERILE IRR 1000ML POUR (IV SOLUTION) ×2 IMPLANT

## 2021-09-25 NOTE — Anesthesia Procedure Notes (Signed)
Procedure Name: Intubation ?Date/Time: 09/25/2021 8:30 AM ?Performed by: Leonor Liv, CRNA ?Pre-anesthesia Checklist: Patient identified, Emergency Drugs available, Suction available and Patient being monitored ?Patient Re-evaluated:Patient Re-evaluated prior to induction ?Oxygen Delivery Method: Circle System Utilized ?Preoxygenation: Pre-oxygenation with 100% oxygen ?Induction Type: IV induction ?Ventilation: Mask ventilation without difficulty ?Laryngoscope Size: Mac and 3 ?Grade View: Grade I ?Tube type: Oral ?Tube size: 7.0 mm ?Number of attempts: 1 ?Airway Equipment and Method: Stylet and Oral airway ?Placement Confirmation: ETT inserted through vocal cords under direct vision, positive ETCO2 and breath sounds checked- equal and bilateral ?Secured at: 21 cm ?Tube secured with: Tape ?Dental Injury: Teeth and Oropharynx as per pre-operative assessment  ? ? ? ? ?

## 2021-09-25 NOTE — Op Note (Signed)
?  NEUROSURGERY OPERATIVE NOTE  ? ?PREOP DIAGNOSIS:  ?VNS battery end-of-life ?Medically intractable epilepsy  ? ?POSTOP DIAGNOSIS: Same ? ?PROCEDURE: ?1. Replacement of left vagal nerve stimulator battery ? ?SURGEON: Dr. Consuella Lose, MD ? ?ASSISTANT: None ? ?ANESTHESIA: General Endotracheal ? ?EBL: Minimal ? ?SPECIMENS: None ? ?DRAINS: None ? ?COMPLICATIONS: None immediate ? ?CONDITION: Hemodynamically stable to PACU ? ?HISTORY: ?Sandra Collins is a 36 y.o. who was initially seen in the outpatient clinic who previously underwent placement of a VNS several years ago. Recent interrogation revealed near end-of-life of the battery. The risks and benefits of the surgery were reviewed in detail. After all questions were answered, informed consent was obtained. ? ?PROCEDURE IN DETAIL: ?After informed consent was obtained and witnessed, the patient was brought to the operating room. After induction of general anesthesia, the patient was positioned on the operative table in the supine position. All pressure points were meticulously padded. Left infraclavicular skin incision was then marked out and prepped and draped in the usual sterile fashion. ? ?After timeout was conducted, skin incision was infiltrated with local anesthetic with epinephrine. Skin incision was then made sharply, the subcutaneous pocket was opened and the generator identified. This was explanted. The lead was removed and the new generator connected and replaced in the subcutaneous pocket. The new generator was interrogated and noted to have normal lead impedence, accurate hear rate detection and was reprogrammed with the previous settings. Wound was then irrigated with saline and closed with interrupted 3-0 Vicryl stitches. Skin was closed with dermabond. ? ?At the end of the case all sponge, needle, cottonoid, and instrument counts were correct. The patient was then extubated, transferred to the stretcher, and taken to the postanesthesia care  unit in stable hemodynamic condition. ? ? ?Consuella Lose, MD ?[facility]  ? ?

## 2021-09-25 NOTE — H&P (Signed)
?Chief Complaint  ?Medically intractable epilepsy ? ?History of Present Illness  ?Sandra Collins is a 36 y.o. female with a history of medically intractable epilepsy and previous placement of a left VNS. Recent interogation has revealed near end-of-life of the battery. She therefore presents today for battery change. ? ?Past Medical History  ? ?Past Medical History:  ?Diagnosis Date  ? Depression   ? Epilepsy ([place])   ? Headache   ? Hyperlipidemia   ? Hypothyroidism   ? Short-term memory loss   ? Caused by seizures  ? Thyroid cancer ([place])   ? ? ?Past Surgical History  ? ?Past Surgical History:  ?Procedure Laterality Date  ? BRAIN SURGERY  05/03/2006  ? IMPLANTATION VAGAL NERVE STIMULATOR  09/28/2015  ? IR 3D INDEPENDENT [facility]ST  07/03/2021  ? IR ANGIO INTRA EXTRACRAN SEL INTERNAL CAROTID BILAT MOD SED  07/03/2021  ? IR ANGIO VERTEBRAL SEL VERTEBRAL UNI R MOD SED  07/03/2021  ? IR CT HEAD LTD  07/03/2021  ? IR RADIOLOGIST EVAL & MGMT  06/12/2021  ? IR RADIOLOGIST EVAL & MGMT  07/25/2021  ? IR TRANSCATH/EMBOLIZ  07/03/2021  ? RADIOLOGY WITH ANESTHESIA N/A 07/03/2021  ? Procedure: Cerebral angiogram with intent to embolize brain aneurysm;  Surgeon: Luanne Bras, MD;  Location: [facility];  Service: Radiology;  Laterality: N/A;  ? THYROID SURGERY    ? cancer  ? ? ?Social History  ? ?Social History  ? ?Tobacco Use  ? Smoking status: Never  ? Smokeless tobacco: Never  ?Vaping Use  ? Vaping Use: Never used  ?Substance Use Topics  ? Alcohol use: Yes  ?  Comment: rare  ? Drug use: Never  ? ? ?Medications  ? ?Prior to Admission medications   ?Medication Sig Start Date End Date Taking? Authorizing Provider  ?Ascorbic Acid (VITAMIN C) 1000 MG tablet Take 1,000 mg by mouth daily.   Yes [provider]  ?aspirin EC 81 MG tablet Take 81 mg by mouth daily. Swallow whole.   Yes [provider]  ?b complex vitamins capsule Take 1 capsule by mouth daily.   Yes [provider]  ?Biotin 1000 MCG tablet Take 1,000 mcg by  mouth daily.   Yes [provider]  ?Calcium Carb-Cholecalciferol (CALCIUM 600/VITAMIN D PO) Take 1 tablet by mouth daily.   Yes [provider]  ?cholecalciferol (VITAMIN D3) 25 MCG (1000 UNIT) tablet Take 1,000 Units by mouth daily.   Yes [provider]  ?[employer] FIBER GUMMY BEARS CHILDREN PO Take 2 each by mouth daily.   Yes [provider]  ?ibuprofen (ADVIL) 200 MG tablet Take 400 mg by mouth every 6 (six) hours as needed for headache.   Yes [provider]  ?levothyroxine (SYNTHROID) 75 MCG tablet Take 0.5 tablets (37.5 mcg total) by mouth daily before breakfast. 09/18/21  Yes Hoyt Koch, MD  ?LORazepam (ATIVAN) 2 MG tablet Take 2 mg by mouth daily as needed for seizure.   Yes [provider]  ?medroxyPROGESTERone (DEPO-PROVERA) 150 MG/ML injection Inject 1 mL (150 mg total) into the muscle every 3 (three) months. 07/31/21  Yes Hoyt Koch, MD  ?Multiple Vitamins-Minerals (MULTIVITAMIN WITH MINERALS) tablet Take 1 tablet by mouth daily.   Yes [provider]  ?NON FORMULARY Place 70 mg under the tongue in the morning and at bedtime. CBD oil   Yes [provider]  ?Omega-3 Fatty Acids (FISH OIL) 1200 MG CPDR Take 1,200 mg by mouth daily. 02/23/20  Yes  [provider]  ?Plant Sterols and Stanols (CHOLEST OFF PO) Take 2 tablets by mouth in the morning and at bedtime.   Yes [provider]  ?topiramate (TOPAMAX) 200 MG tablet Take 1 tablet (200 mg total) by mouth 2 (two) times daily. 02/14/21  Yes Cameron Sprang, MD  ?vitamin B-12 (CYANOCOBALAMIN) 1000 MCG tablet Take 1,000 mcg by mouth daily.   Yes [provider]  ?clopidogrel (PLAVIX) 75 MG tablet Take 1 tablet (75 mg total) by mouth daily. ?Patient not taking: Reported on 09/15/2021 06/21/21 10/19/21  Theresa Duty, NP  ? ? ?Allergies  ? ?Allergies  ?Allergen Reactions  ? Estrogens Other (See Comments)  ?  Seizures  ? Tape Hives, Itching, Swelling  and Rash  ?  Must use paper tape.   ? Amoxicillin Hives  ? Latex Hives  ? Metronidazole Hives  ? Pregabalin Other (See Comments)  ?  Excessive weight gain ? ?  ? ? ?Review of Systems  ?ROS ? ?Neurologic Exam  ?Awake, alert, oriented ?Memory and concentration grossly intact ?Speech fluent, appropriate ?CN grossly intact ?Motor exam: ?Upper Extremities Deltoid Bicep Tricep Grip  ?Right 5/5 5/5 5/5 5/5  ?Left 5/5 5/5 5/5 5/5  ? ?Lower Extremities IP Quad PF DF EHL  ?Right 5/5 5/5 5/5 5/5 5/5  ?Left 5/5 5/5 5/5 5/5 5/5  ? ?Sensation grossly intact to LT ? ?Impression  ?- 36 y.o. female with medically intractable epilepsy and near end-of-life of previously implanted VNS ? ?Plan  ?- Will proceed with VNS battery change. ? ?The risks, benefits, and alternatives were discussed at length in the office. All questions today were answered and the patient provided informed consent to proceed. ? ?Consuella Lose, MD ?[facility]  ? ? ?

## 2021-09-25 NOTE — Transfer of Care (Signed)
Immediate Anesthesia Transfer of Care Note ? ?Patient: Sandra Collins ? ?Procedure(s) Performed: Vagus Nerve Stimulator  Battery Change (Left: Chest) ? ?Patient Location: PACU ? ?Anesthesia Type:General ? ?Level of Consciousness: awake and drowsy ? ?Airway & Oxygen Therapy: Patient Spontanous Breathing and Patient connected to nasal cannula oxygen ? ?Post-op Assessment: Report given to RN and Post -op Vital signs reviewed and stable ? ?Post vital signs: Reviewed and stable ? ?Last Vitals:  ?Vitals Value Taken Time  ?BP 121/76 09/25/21 0957  ?Temp 36.6 ?C 09/25/21 0957  ?Pulse 80 09/25/21 0957  ?Resp 19 09/25/21 0958  ?SpO2 99 % 09/25/21 0957  ?Vitals shown include unvalidated device data. ? ?Last Pain:  ?Vitals:  ? 09/25/21 0957  ?TempSrc:   ?PainSc: 0-No pain  ?   ? ?  ? ?Complications: No notable events documented. ?

## 2021-09-25 NOTE — Discharge Summary (Addendum)
?Physician Discharge Summary  ?Patient ID: ?Sandra Collins ?MRN: [id number] ?DOB/AGE: February 19, 1986 36 y.o. ? ?Admit date: 09/25/2021 ?Discharge date: 09/25/2021 ? ?Admission Diagnoses:  ?VNS battery end-of-life ? ?Discharge Diagnoses:  ?Same ?Active Problems: ?  * No active hospital problems. * ? ? ?Discharged Condition: Stable ? ?Hospital Course:  ?Sandra Collins is a 36 y.o. female who underwent uncomplicated replacement of a left VNS battery. She was at baseline postop and discharged home from PACU in stable condition. ? ?Treatments: Surgery - Replacement of left VNS battery ? ?Discharge Exam: ?Blood pressure 131/88, pulse 92, temperature 98 ?F (36.7 ?C), temperature source Oral, resp. rate 18, height '5\' 3"'$  (1.6 m), weight 90.7 kg, SpO2 100 %. ?Awake, alert, oriented ?Speech fluent, appropriate ?CN grossly intact ?5/5 BUE/BLE ?Wound c/d/i ? ?Disposition: Discharge disposition: 01-Home or Self Care ? ? ? ? ? ? ?Discharge Instructions   ? ? Call MD for:  redness, tenderness, or signs of infection (pain, swelling, redness, odor or green/yellow discharge around incision site)   Complete by: As directed ?  ? Call MD for:  temperature >100.4   Complete by: As directed ?  ? Diet - low sodium heart healthy   Complete by: As directed ?  ? Discharge instructions   Complete by: As directed ?  ? Walk at home as much as possible, at least 4 times / day  ? Increase activity slowly   Complete by: As directed ?  ? Lifting restrictions   Complete by: As directed ?  ? No lifting > 10 lbs  ? May shower / Bathe   Complete by: As directed ?  ? 48 hours after surgery  ? May walk up steps   Complete by: As directed ?  ? Other Restrictions   Complete by: As directed ?  ? No bending/twisting at waist  ? Remove dressing in 48 hours   Complete by: As directed ?  ? ?  ? ?Allergies as of 09/25/2021   ? ?   Reactions  ? Estrogens Other (See Comments)  ? Seizures  ? Tape Hives, Itching, Swelling, Rash  ? Must use paper tape.   ? Amoxicillin  Hives  ? Latex Hives  ? Metronidazole Hives  ? Pregabalin Other (See Comments)  ? Excessive weight gain  ? ?  ? ?  ?Medication List  ?  ? ?STOP taking these medications   ? ?clopidogrel 75 MG tablet ?Commonly known as: Plavix ?  ? ?  ? ?TAKE these medications   ? ?aspirin EC 81 MG tablet ?Take 81 mg by mouth daily. Swallow whole. ?  ?b complex vitamins capsule ?Take 1 capsule by mouth daily. ?  ?Biotin 1000 MCG tablet ?Take 1,000 mcg by mouth daily. ?  ?CALCIUM 600/VITAMIN D PO ?Take 1 tablet by mouth daily. ?  ?cholecalciferol 25 MCG (1000 UNIT) tablet ?Commonly known as: VITAMIN D3 ?Take 1,000 Units by mouth daily. ?  ?CHOLEST OFF PO ?Take 2 tablets by mouth in the morning and at bedtime. ?  ?[employer] FIBER GUMMY BEARS CHILDREN PO ?Take 2 each by mouth daily. ?  ?Fish Oil 1200 MG Cpdr ?Take 1,200 mg by mouth daily. ?  ?HYDROcodone-acetaminophen 5-325 MG tablet ?Commonly known as: NORCO/VICODIN ?Take 1 tablet by mouth every 8 (eight) hours as needed for up to 3 days for moderate pain. ?  ?ibuprofen 200 MG tablet ?Commonly known as: ADVIL ?Take 400 mg by mouth every 6 (six) hours as needed for headache. ?  ?levothyroxine 75 MCG  tablet ?Commonly known as: SYNTHROID ?Take 0.5 tablets (37.5 mcg total) by mouth daily before breakfast. ?  ?LORazepam 2 MG tablet ?Commonly known as: ATIVAN ?Take 2 mg by mouth daily as needed for seizure. ?  ?medroxyPROGESTERone 150 MG/ML injection ?Commonly known as: DEPO-PROVERA ?Inject 1 mL (150 mg total) into the muscle every 3 (three) months. ?  ?multivitamin with minerals tablet ?Take 1 tablet by mouth daily. ?  ?NON FORMULARY ?Place 70 mg under the tongue in the morning and at bedtime. CBD oil ?  ?topiramate 200 MG tablet ?Commonly known as: TOPAMAX ?Take 1 tablet (200 mg total) by mouth 2 (two) times daily. ?  ?vitamin B-12 1000 MCG tablet ?Commonly known as: CYANOCOBALAMIN ?Take 1,000 mcg by mouth daily. ?  ?vitamin C 1000 MG tablet ?Take 1,000 mg by mouth daily. ?  ? ?  ? ? Follow-up  Information   ? ? Consuella Lose, MD Follow up.   ?Specialty: Neurosurgery ?Contact information: ?[address] ?[address] ?[place] [address] ?[phone] ? ? ?  ?  ? ?  ?  ? ?  ? ? ?Signed: ?Jairo Ben ?09/25/2021, 9:21 AM ? ? ? ?

## 2021-09-25 NOTE — Anesthesia Preprocedure Evaluation (Signed)
Anesthesia Evaluation  ?Patient identified by MRN, date of birth, ID band ?Patient awake ? ? ? ?Reviewed: ?Allergy & Precautions, NPO status , Patient's Chart, lab work & pertinent test results ? ?History of Anesthesia Complications ?Negative for: history of anesthetic complications ? ?Airway ?Mallampati: IV ? ? ?Neck ROM: Full ? ?Mouth opening: Limited Mouth Opening ? Dental ? ?(+) Teeth Intact, Dental Advisory Given ?  ?Pulmonary ? ?  ?breath sounds clear to auscultation ? ? ? ? ? ? Cardiovascular ?negative cardio ROS ? ? ?Rhythm:Regular  ? ?  ?Neuro/Psych ? Headaches, Seizures -,  PSYCHIATRIC DISORDERS Anxiety Depression Vagal nerve stimulator  ?  ? GI/Hepatic ?negative GI ROS, Neg liver ROS,   ?Endo/Other  ?Hypothyroidism  ? Renal/GU ?negative Renal ROS  ? ?  ?Musculoskeletal ?negative musculoskeletal ROS ?(+)  ? Abdominal ?  ?Peds ? Hematology ?negative hematology ROS ?(+) Lab Results ?     Component                Value               Date                 ?     WBC                      6.8                 09/19/2021           ?     HGB                      14.3                09/19/2021           ?     HCT                      43.2                09/19/2021           ?     MCV                      94.9                09/19/2021           ?     PLT                      215                 09/19/2021           ?   ?Anesthesia Other Findings ? ? Reproductive/Obstetrics ?Lab Results ?     Component                Value               Date                 ?     PREGTESTUR               NEGATIVE            09/25/2021           ? ? ?  ? ? ? ? ? ? ? ? ? ? ? ? ? ?  ?  ? ? ? ? ? ? ? ? ?  Anesthesia Physical ?Anesthesia Plan ? ?ASA: 3 ? ?Anesthesia Plan: General  ? ?Post-op Pain Management: Toradol IV (intra-op)* and Ofirmev IV (intra-op)*  ? ?Induction: Intravenous ? ?PONV Risk Score and Plan: 3 and Ondansetron and Dexamethasone ? ?Airway Management Planned: Oral ETT and LMA ? ?Additional  Equipment: None ? ?Intra-op Plan:  ? ?Post-operative Plan: Extubation in OR ? ?Informed Consent: I have reviewed the patients History and Physical, chart, labs and discussed the procedure including the risks, benefits and alternatives for the proposed anesthesia with the patient or authorized representative who has indicated his/her understanding and acceptance.  ? ? ? ?Dental advisory given ? ?Plan Discussed with: CRNA ? ?Anesthesia Plan Comments:   ? ? ? ? ? ? ?Anesthesia Quick Evaluation ? ?

## 2021-09-26 ENCOUNTER — Encounter (HOSPITAL_COMMUNITY): Payer: Self-pay | Admitting: Neurosurgery

## 2021-09-26 NOTE — Anesthesia Postprocedure Evaluation (Signed)
Anesthesia Post Note ? ?Patient: Sandra Collins ? ?Procedure(s) Performed: Vagus Nerve Stimulator  Battery Change (Left: Chest) ? ?  ? ?Patient location during evaluation: PACU ?Anesthesia Type: General ?Level of consciousness: awake and alert ?Pain management: pain level controlled ?Vital Signs Assessment: post-procedure vital signs reviewed and stable ?Respiratory status: spontaneous breathing, nonlabored ventilation, respiratory function stable and patient connected to nasal cannula oxygen ?Cardiovascular status: blood pressure returned to baseline and stable ?Anesthetic complications: no ? ? ?No notable events documented. ? ?Last Vitals:  ?Vitals:  ? 09/25/21 0943 09/25/21 0957  ?BP: 126/86 121/76  ?Pulse: 91 80  ?Resp: 17 18  ?Temp:  36.6 ?C  ?SpO2: 100% 99%  ?  ?Last Pain:  ?Vitals:  ? 09/25/21 0957  ?TempSrc:   ?PainSc: 0-No pain  ? ? ?  ?  ?  ?  ?  ?  ? ?Sandra Collins ? ? ? ? ?

## 2021-10-02 ENCOUNTER — Telehealth: Payer: Self-pay | Admitting: Internal Medicine

## 2021-10-02 NOTE — Telephone Encounter (Signed)
Can fax the requisition for TSH, free T4 and lipid we already ordered. Thanks ?

## 2021-10-02 NOTE — Telephone Encounter (Signed)
Patient called and states that she finally talked to her insurance company and found out where she can have her blood work done.  Orders need to be sent [facility]  on [address].  Please order tests ? ?

## 2021-10-03 NOTE — Telephone Encounter (Signed)
Labs have been faxed to [facility]. Patient has been informed.  ?

## 2021-10-05 ENCOUNTER — Other Ambulatory Visit: Payer: Self-pay | Admitting: Internal Medicine

## 2021-10-06 LAB — LIPID PANEL
Cholesterol: 187 mg/dL (ref <200)
HDL: 50 mg/dL (ref >=50)
LDL Cholesterol (Calc): 112 mg/dL (calc) — ABNORMAL HIGH
Non-HDL Cholesterol (Calc): 137 mg/dL (calc) — ABNORMAL HIGH (ref <130)
Total CHOL/HDL Ratio: 3.7 (calc) (ref <5.0)
Triglycerides: 140 mg/dL (ref <150)

## 2021-10-06 LAB — T4, FREE: Free T4: 1.2 ng/dL (ref 0.8–1.8)

## 2021-10-06 LAB — TSH: TSH: 2.82 mIU/L

## 2021-10-30 ENCOUNTER — Encounter: Payer: Self-pay | Admitting: Neurology

## 2021-10-30 ENCOUNTER — Ambulatory Visit (INDEPENDENT_AMBULATORY_CARE_PROVIDER_SITE_OTHER): Payer: Medicare Other | Admitting: Neurology

## 2021-10-30 VITALS — BP 111/82 | HR 86 | Ht 63.0 in | Wt 207.0 lb

## 2021-10-30 DIAGNOSIS — G40219 Localization-related (focal) (partial) symptomatic epilepsy and epileptic syndromes with complex partial seizures, intractable, without status epilepticus: Secondary | ICD-10-CM | POA: Diagnosis not present

## 2021-10-30 MED ORDER — TOPIRAMATE 100 MG PO TABS: ORAL_TABLET | ORAL | 3 refills | Status: DC

## 2021-10-30 MED ORDER — TOPIRAMATE 50 MG PO TABS: ORAL_TABLET | ORAL | 2 refills | Status: DC

## 2021-10-30 NOTE — Patient Instructions (Signed)
Good to see you doing well!  Start reducing Topiramate '150mg'$  twice a day for 3 months, then reduce to '100mg'$  twice a day  2. Please send the dosage of the CBD, how many mg in an mL  3. Follow-up in 6 months, call for any changes   Seizure Precautions: 1. If medication has been prescribed for you to prevent seizures, take it exactly as directed.  Do not stop taking the medicine without talking to your doctor first, even if you have not had a seizure in a long time.   2. Avoid activities in which a seizure would cause danger to yourself or to others.  Don't operate dangerous machinery, swim alone, or climb in high or dangerous places, such as on ladders, roofs, or girders.  Do not drive unless your doctor says you may.  3. If you have any warning that you may have a seizure, lay down in a safe place where you can't hurt yourself.    4.  No driving for 6 months from last seizure, as per [facility].   Please refer to the following link on the [place] website for more information: http://[email or web address].org/[email or web address]/driving/drivingu.cfm   5.  Maintain good sleep hygiene. Avoid alcohol.  6.  Notify your neurology if you are planning pregnancy or if you become pregnant.  7.  Contact your doctor if you have any problems that may be related to the medicine you are taking.  8.  Call 911 and bring the patient back to the ED if:        A.  The seizure lasts longer than 5 minutes.       B.  The patient doesn't awaken shortly after the seizure  C.  The patient has new problems such as difficulty seeing, speaking or moving  D.  The patient was injured during the seizure  E.  The patient has a temperature over 102 F (39C)  F.  The patient vomited and now is having trouble breathing

## 2021-10-30 NOTE — Progress Notes (Signed)
NEUROLOGY FOLLOW UP OFFICE NOTE  Sandra Collins [id number] 34-Jan-1987  HISTORY OF PRESENT ILLNESS: I had the pleasure of seeing Sandra Collins in follow-up in the neurology clinic on 10/30/2021.  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 She is again accompanied by her mother who helps supplement the history today. Since her last visit, she underwent VNS battery replacement on 09/25/2021. She had some skin redness from the Vancomycin which improved with Prednisone. She and her mother continue to deny any seizures since 02/2021. She is on Topiramate '200mg'$  BID and CBD '70mg'$  BID obtained from a pharmacy in [place]. She denies any further headaches, no dizziness, focal numbness/tingling/weakness. Sleep is good. No snoring since she started allergy medication. She is interested in weaning down Topiramate similar to how Lamotrigine was weaned off with increase in CBD dose by her prior neurologist.    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 Records from Dr. Amalia Hailey and provided by her mother were reviewed. Seizures started at 10 months of age (10/1986). She was started on Depakote in 07/1989. She underwent EMU monitoring in [place] in 2007 and had left temporal lobe surgery in [place], [place] that was unsuccessful. VNS was placed in 2017. Main trigger noted for her seizures is a temperature of 99 degrees, whether from illness, physical activity, stress, or menstrual period. She started a CBD schedule in 2020 with her neurologist in [place] and they were able to wean off Lamotrigine in 2021. She continues on Topiramate '200mg'$  BID and CBD '70mg'$  BID. CBD is obtained from [employer] in [place].  Seizure semiology has included staring with bilateral hand automatisms followed by generalized  tonic posturing, and drop attacks. She will have an aura of grabbing on to something then has loss of consciousness. Her mother notes shakiness and pupils dilate. On her last visit with Dr. Amalia Hailey in 08/2020, they reported her last seizure with loss of consciousness/GTC was in 12/2019, however today report that she had 4 seizures last year with spacing out or behavioral arrest for 15 seconds. She had a seizure yesterday in her sleep occurring on and off from 2:30am to 6am. She almost fell off the bed. She was biting her pillows and teddy bear trying to stay calm. She could feel her body shaking a little bit for 15-30 seconds, occurring repeatedly and waking her up. She did have a low grade temperature and took Ibuprofen and '2mg'$  lorazepam, with no further events for the rest of the day. She denies any loss of consciousness with the recent seizures. She has rare body jerks. She denies any olfactory/gustatory hallucinations, focal numbness/tingling/weakness. She has rare migraines and takes Ibuprofen with good response. She has some swallowing difficulties due to her thyroid issues, and back pain due to scoliosis. No bowel/bladder dysfunction. Sleep is good. Mood is alright. She manages her own medications without issues.She lives with her mother and seizure dog. She does not drive.   Epilepsy Risk Factors:  A paternal aunt had epilepsy in childhood, 2 paternal cousins have epilepsy. She had a febrile convulsion in childhood. Otherwise she had a normal birth with no history of CNS infections such as meningitis/encephalitis, significant traumatic brain injury, neurosurgical procedures  Prior ASMs: Lamotrigine, Sabril, Dilantin, Trileptal, Lyrica, Onfi, Depakote, Zarontin, Felbatol, Neurontin, Tegretol, Phenobarbital   PAST MEDICAL HISTORY: Past Medical  History:  Diagnosis Date   Depression    Epilepsy ([place])    Headache    Hyperlipidemia    Hypothyroidism    Short-term memory loss    Caused by seizures    Thyroid cancer ([place])     MEDICATIONS: Current Outpatient Medications on File Prior to Visit  Medication Sig Dispense Refill   Ascorbic Acid (VITAMIN C) 1000 MG tablet Take 1,000 mg by mouth daily.     aspirin EC 81 MG tablet Take 81 mg by mouth daily. Swallow whole.     b complex vitamins capsule Take 1 capsule by mouth daily.     Biotin 1000 MCG tablet Take 1,000 mcg by mouth daily.     Calcium Carb-Cholecalciferol (CALCIUM 600/VITAMIN D PO) Take 1 tablet by mouth daily.     cholecalciferol (VITAMIN D3) 25 MCG (1000 UNIT) tablet Take 1,000 Units by mouth daily.     CVS FIBER GUMMY BEARS CHILDREN PO Take 2 each by mouth daily.     ibuprofen (ADVIL) 200 MG tablet Take 400 mg by mouth every 6 (six) hours as needed for headache.     levothyroxine (SYNTHROID) 75 MCG tablet Take 0.5 tablets (37.5 mcg total) by mouth daily before breakfast. 90 tablet 0   LORazepam (ATIVAN) 2 MG tablet Take 2 mg by mouth daily as needed for seizure.     medroxyPROGESTERone (DEPO-PROVERA) 150 MG/ML injection Inject 1 mL (150 mg total) into the muscle every 3 (three) months. 1 mL 3   Multiple Vitamins-Minerals (MULTIVITAMIN WITH MINERALS) tablet Take 1 tablet by mouth daily.     NON FORMULARY Place 70 mg under the tongue in the morning and at bedtime. CBD oil     Omega-3 Fatty Acids (FISH OIL) 1200 MG CPDR Take 1,200 mg by mouth daily.     topiramate (TOPAMAX) 200 MG tablet Take 1 tablet (200 mg total) by mouth 2 (two) times daily. 180 tablet 3   vitamin B-12 (CYANOCOBALAMIN) 1000 MCG tablet Take 1,000 mcg by mouth daily.     Plant Sterols and Stanols (CHOLEST OFF PO) Take 2 tablets by mouth in the morning and at bedtime. (Patient not taking: Reported on 10/30/2021)     No current facility-administered medications on file prior to visit.    ALLERGIES: Allergies  Allergen Reactions   Estrogens Other (See Comments)    Seizures   Tape Hives, Itching, Swelling and Rash    Must use paper tape.    Amoxicillin Hives    Latex Hives   Metronidazole Hives   Pregabalin Other (See Comments)    Excessive weight gain      FAMILY HISTORY: Family History  Problem Relation Age of Onset   Cancer Mother     SOCIAL HISTORY: Social History   Socioeconomic History   Marital status: Single    Spouse name: Not on file   Number of children: Not on file   Years of education: Not on file   Highest education level: Not on file  Occupational History   Not on file  Tobacco Use   Smoking status: Never   Smokeless tobacco: Never  Vaping Use   Vaping Use: Never used  Substance and Sexual Activity   Alcohol use: Yes    Comment: rare   Drug use: Never   Sexual activity: Not on file  Other Topics Concern   Not on file  Social History Narrative   Right handed   Lives with mother in a one  Caffeine one a day   2 dogs   Social Determinants of Radio broadcast assistant Strain: Not on file  Food Insecurity: Not on file  Transportation Needs: Not on file  Physical Activity: Not on file  Stress: Not on file  Social Connections: Not on file  Intimate Partner Violence: Not on file     PHYSICAL EXAM: Vitals:   10/30/21 1518  BP: 111/82  Pulse: 86  SpO2: 100%   General: No acute distress Head:  Normocephalic/atraumatic Skin/Extremities: No rash, no edema Neurological Exam: alert and awake. No aphasia or dysarthria. Fund of knowledge is appropriate.  Attention and concentration are normal.   Cranial nerves: Pupils equal, round. Extraocular movements intact with no nystagmus. Visual fields full.  No facial asymmetry.  Motor: Bulk and tone normal, muscle strength 5/5 throughout with no pronator drift.   Finger to nose testing intact.  Gait narrow-based and steady, no ataxia  VNS Therapy Management: Unit Information Implant Date: 09/25/21 Serial Number: [id number] Generator Number: [id number] (Aspire SR M106) Parameters Output Current (mA): 2 Signal Frequency (Hz): 25 Pulse Width (usec): 500 Signal ON  Time (sec): 30 Signal OFF Time (min): 5 Magnet Output Current (mA): 2.5 Magnet ON Time (sec): 60 Magnet Pulse Width (usec): 250 AutoStim Output Current (mA): 2.25 AutoStim Pulse Width (usec): 250 AutoStim ON Time (sec): 60 Tachycardia Detection : On Heartbeat Detection Sensitivity: 3 Perform Verify Heartbeat Detection: yes Threshold for AutoStim (%): 20% Diagnostics Current Delievered (mA): 2 Lead Impedance: OK Impedence Value (Ohms): 2715 Battery Status Indicator (color): Green (75-100%)   IMPRESSION: This is a 36 yo RH woman with 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 MRI brain did not show any significant intracranial abnormalities. 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 VNS interrogated today, no changes made. Battery 75-100%. She is happy to report she continues to be seizure-free since 02/2021 (no GTCs since 2021) and expressed interest in reducing Topiramate. We discussed that we can reduce to '150mg'$  BID and monitor symptoms for the next 3 months before reducing to '100mg'$  BID. She will send in how many mg/mL in her CBD formulation and we can plan to increase dose accordingly as she weans down Topiramate. She does not drive. Follow-up in 6 months, call for any changes.    Thank you for allowing me to participate in her care.  Please do not hesitate to call for any questions or concerns.    Ellouise Newer, M.D.   CC: Dr. Sharlet Salina

## 2021-11-01 ENCOUNTER — Telehealth: Payer: Self-pay | Admitting: Neurology

## 2021-11-01 MED ORDER — TOPIRAMATE 50 MG PO TABS: ORAL_TABLET | ORAL | 2 refills | Status: DC

## 2021-11-01 MED ORDER — TOPIRAMATE 100 MG PO TABS: ORAL_TABLET | ORAL | 3 refills | Status: DC

## 2021-11-01 NOTE — Telephone Encounter (Signed)
Topiramate prescriptions have been resent to the pharmacy for the pt. Sending to Dr Delice Lesch to see message about MRI MRA

## 2021-11-01 NOTE — Telephone Encounter (Signed)
Pls let patient know that I contacted Dr. Arlean Hopping PA and she will be ordering the studies, thanks

## 2021-11-01 NOTE — Telephone Encounter (Signed)
Pt called an informed that Dr Anette Guarneri office will order the studies she asked about and that we has sent in the 2 prescriptions,

## 2021-11-01 NOTE — Telephone Encounter (Signed)
Pt stated the prescription that was sent in for the 2 different topiramates didn't go through to [facility]. Their power was out for about 4 hours that day. She would like it resent.   She also stated she forgot to tell Dr. Delice Lesch that Dr. Matthew Saras wanted Dr. Delice Lesch to order and MRI and MRA in July.

## 2021-11-20 ENCOUNTER — Other Ambulatory Visit: Payer: Self-pay | Admitting: Internal Medicine

## 2021-11-22 ENCOUNTER — Telehealth: Payer: Self-pay | Admitting: Neurology

## 2021-11-22 NOTE — Telephone Encounter (Signed)
Patient called upset cause she looked up doctor delvshwire kumar and it said he was permanently closed. She is supposed to have MRI done in July and doesn't know what to do

## 2021-11-22 NOTE — Telephone Encounter (Signed)
Called pateint back and got correct number and verified that the doctor was there Patient was very happy for the help

## 2021-11-23 ENCOUNTER — Other Ambulatory Visit (HOSPITAL_COMMUNITY): Payer: Self-pay | Admitting: Interventional Radiology

## 2021-11-23 DIAGNOSIS — I671 Cerebral aneurysm, nonruptured: Secondary | ICD-10-CM

## 2021-12-05 ENCOUNTER — Telehealth (HOSPITAL_COMMUNITY): Payer: Self-pay

## 2021-12-05 NOTE — Telephone Encounter (Signed)
Patient called to give me nerve stimulator information needed for upcoming MRI.   Manufacturer: [id number]  Serial# [id number]  Model# [id number]

## 2021-12-08 ENCOUNTER — Telehealth: Payer: Self-pay

## 2021-12-08 NOTE — Telephone Encounter (Signed)
Pt called informed that she needs to come by the office to have her VNS turned off for her MRI /MRA and then come back by the office to have it turned on, pt verbalized understanding and stated she would tell tell her mom

## 2021-12-18 ENCOUNTER — Ambulatory Visit (HOSPITAL_COMMUNITY)
Admission: RE | Admit: 2021-12-18 | Discharge: 2021-12-18 | Disposition: A | Discharge status: Home / Self Care | Payer: Medicare Other | Source: Ambulatory Visit | Attending: Interventional Radiology | Admitting: Interventional Radiology

## 2021-12-18 DIAGNOSIS — I671 Cerebral aneurysm, nonruptured: Secondary | ICD-10-CM

## 2021-12-18 MED ORDER — GADOBUTROL 1 MMOL/ML IV SOLN
10.0000 mL | Freq: Once | INTRAVENOUS | Status: AC | PRN
  Administered 2021-12-18: 10 mL via INTRAVENOUS

## 2021-12-21 ENCOUNTER — Telehealth (HOSPITAL_COMMUNITY): Payer: Self-pay

## 2021-12-21 NOTE — Telephone Encounter (Signed)
Pt agreed to f/u in 6 months with an mra only. AW

## 2022-01-31 ENCOUNTER — Ambulatory Visit
Admission: RE | Admit: 2022-01-31 | Discharge: 2022-01-31 | Disposition: A | Discharge status: Home / Self Care | Payer: Medicare Other | Source: Ambulatory Visit | Attending: Otolaryngology | Admitting: Otolaryngology

## 2022-01-31 DIAGNOSIS — C73 Malignant neoplasm of thyroid gland: Secondary | ICD-10-CM

## 2022-02-16 ENCOUNTER — Other Ambulatory Visit: Payer: Self-pay | Admitting: Otolaryngology

## 2022-02-16 DIAGNOSIS — E041 Nontoxic single thyroid nodule: Secondary | ICD-10-CM

## 2022-02-16 DIAGNOSIS — C73 Malignant neoplasm of thyroid gland: Secondary | ICD-10-CM

## 2022-02-19 ENCOUNTER — Ambulatory Visit (INDEPENDENT_AMBULATORY_CARE_PROVIDER_SITE_OTHER): Payer: Medicare Other | Admitting: Internal Medicine

## 2022-02-19 ENCOUNTER — Encounter: Payer: Self-pay | Admitting: Internal Medicine

## 2022-02-19 VITALS — BP 110/68 | HR 92 | Ht 63.0 in | Wt 206.0 lb

## 2022-02-19 DIAGNOSIS — R197 Diarrhea, unspecified: Secondary | ICD-10-CM | POA: Insufficient documentation

## 2022-02-19 DIAGNOSIS — E890 Postprocedural hypothyroidism: Secondary | ICD-10-CM | POA: Diagnosis not present

## 2022-02-19 DIAGNOSIS — Z0000 Encounter for general adult medical examination without abnormal findings: Secondary | ICD-10-CM | POA: Diagnosis not present

## 2022-02-19 DIAGNOSIS — Z23 Encounter for immunization: Secondary | ICD-10-CM

## 2022-02-19 DIAGNOSIS — E7800 Pure hypercholesterolemia, unspecified: Secondary | ICD-10-CM

## 2022-02-19 DIAGNOSIS — C73 Malignant neoplasm of thyroid gland: Secondary | ICD-10-CM

## 2022-02-19 LAB — CBC
HCT: 41.8 % (ref 36.0–46.0)
Hemoglobin: 14 g/dL (ref 12.0–15.0)
MCHC: 33.5 g/dL (ref 30.0–36.0)
MCV: 93.5 fl (ref 78.0–100.0)
Platelets: 213 10*3/uL (ref 150.0–400.0)
RBC: 4.47 Mil/uL (ref 3.87–5.11)
RDW: 12.8 % (ref 11.5–15.5)
WBC: 5.1 10*3/uL (ref 4.0–10.5)

## 2022-02-19 LAB — COMPREHENSIVE METABOLIC PANEL
ALT: 16 U/L (ref 0–35)
AST: 13 U/L (ref 0–37)
Albumin: 4.4 g/dL (ref 3.5–5.2)
Alkaline Phosphatase: 49 U/L (ref 39–117)
BUN: 13 mg/dL (ref 6–23)
CO2: 20 mEq/L (ref 19–32)
Calcium: 9.3 mg/dL (ref 8.4–10.5)
Chloride: 110 mEq/L (ref 96–112)
Creatinine, Ser: 1.21 mg/dL — ABNORMAL HIGH (ref 0.40–1.20)
GFR: 58.7 mL/min — ABNORMAL LOW (ref >60.00)
Glucose, Bld: 84 mg/dL (ref 70–99)
Potassium: 3.7 mEq/L (ref 3.5–5.1)
Sodium: 141 mEq/L (ref 135–145)
Total Bilirubin: 0.3 mg/dL (ref 0.2–1.2)
Total Protein: 7.7 g/dL (ref 6.0–8.3)

## 2022-02-19 LAB — LIPID PANEL
Cholesterol: 179 mg/dL (ref 0–200)
HDL: 48.4 mg/dL (ref >39.00)
LDL Cholesterol: 110 mg/dL — ABNORMAL HIGH (ref 0–99)
NonHDL: 130.76
Total CHOL/HDL Ratio: 4
Triglycerides: 104 mg/dL (ref 0.0–149.0)
VLDL: 20.8 mg/dL (ref 0.0–40.0)

## 2022-02-19 LAB — TSH: TSH: 2.62 u[IU]/mL (ref 0.35–5.50)

## 2022-02-19 LAB — T4, FREE: Free T4: 0.9 ng/dL (ref 0.60–1.60)

## 2022-02-19 NOTE — Progress Notes (Signed)
   Subjective:   Patient ID: Sandra Collins, female    DOB: 07-17-1985, 36 y.o.   MRN: [id number]  HPI The patient is here for physical.  PMH, [facility], social history reviewed and updated  Review of Systems  Constitutional:  Positive for unexpected weight change. Negative for activity change and appetite change.  HENT: Negative.    Eyes: Negative.   Respiratory:  Negative for cough, chest tightness and shortness of breath.   Cardiovascular:  Negative for chest pain, palpitations and leg swelling.  Gastrointestinal:  Negative for abdominal distention, abdominal pain, constipation, diarrhea, nausea and vomiting.  Musculoskeletal: Negative.   Skin: Negative.   Neurological: Negative.   Psychiatric/Behavioral: Negative.      Objective:  Physical Exam Constitutional:      Appearance: She is well-developed.  HENT:     Head: Normocephalic and atraumatic.  Cardiovascular:     Rate and Rhythm: Normal rate and regular rhythm.  Pulmonary:     Effort: Pulmonary effort is normal. No respiratory distress.     Breath sounds: Normal breath sounds. No wheezing or rales.  Abdominal:     General: Bowel sounds are normal. There is no distension.     Palpations: Abdomen is soft.     Tenderness: There is no abdominal tenderness. There is no rebound.  Musculoskeletal:     Cervical back: Normal range of motion.  Skin:    General: Skin is warm and dry.  Neurological:     Mental Status: She is alert and oriented to person, place, and time.     Coordination: Coordination normal.     Vitals:   02/19/22 0957  BP: 110/68  Pulse: 92  SpO2: 99%  Weight: 206 lb (93.4 kg)  Height: '5\' 3"'$  (1.6 m)    Assessment & Plan:  Flu shot given at visit

## 2022-02-19 NOTE — Assessment & Plan Note (Signed)
Flu shot given. Covid-19 counseled. Tetanus up to date. Pap smear up to date with gyn. Counseled about sun safety and mole surveillance. Counseled about the dangers of distracted driving. Given 10 year screening recommendations.

## 2022-02-19 NOTE — Assessment & Plan Note (Signed)
Checking TSH and free T4 and adjust synthroid 75 mcg daily dosing as needed.

## 2022-02-19 NOTE — Assessment & Plan Note (Signed)
Checking [place] RUQ limited to rule out gallbladder problems. She is lactose intolerant and eating dairy products advised this could be contributing.

## 2022-02-19 NOTE — Assessment & Plan Note (Signed)
Checking lipid panel. Adjust as needed.

## 2022-02-19 NOTE — Assessment & Plan Note (Signed)
Recently US stable. Needs labs today which are ordered with thyroglobulin and thyroglobulin ab as well as TSH and free T4.

## 2022-02-19 NOTE — Patient Instructions (Signed)
We will check the labs today. 

## 2022-02-20 LAB — HEMOGLOBIN A1C: Hgb A1c MFr Bld: 5.4 % (ref 4.6–6.5)

## 2022-02-22 ENCOUNTER — Ambulatory Visit
Admission: RE | Admit: 2022-02-22 | Discharge: 2022-02-22 | Disposition: A | Discharge status: Home / Self Care | Payer: Medicare Other | Source: Ambulatory Visit | Attending: Internal Medicine | Admitting: Internal Medicine

## 2022-02-22 DIAGNOSIS — R197 Diarrhea, unspecified: Secondary | ICD-10-CM

## 2022-02-26 LAB — THYROGLOBULIN, LC/MS/MS: THYROGLOBULIN, LC/MS/MS: 17.2 ng/mL — ABNORMAL HIGH

## 2022-02-26 LAB — THYROGLOBULIN ANTIBODY: Thyroglobulin Ab: <1 IU/mL (ref <=1)

## 2022-02-27 ENCOUNTER — Telehealth: Payer: Self-pay | Admitting: Internal Medicine

## 2022-02-27 DIAGNOSIS — C73 Malignant neoplasm of thyroid gland: Secondary | ICD-10-CM

## 2022-02-27 DIAGNOSIS — E890 Postprocedural hypothyroidism: Secondary | ICD-10-CM

## 2022-02-27 NOTE — Telephone Encounter (Signed)
Please advise 

## 2022-02-27 NOTE — Telephone Encounter (Signed)
Pt was just seen her ENT-Dr. Fredric Dine- and they are recommending that she see an endocrinologist. ENT wants to send Pt to [facility] but Pt wants to stay inside the [facility] system for INS reasons and would like a referral to see a [facility] or  endo.   Can we send this referral or does the ENT office need to send it out?

## 2022-02-27 NOTE — Telephone Encounter (Signed)
We would need to know specific reason for referral

## 2022-02-28 NOTE — Telephone Encounter (Signed)
Pt stated--spoke to Dr. Meyer Cory need referral to endo due to elevated  thyroid and needed to make changes on thyroid medication.

## 2022-02-28 NOTE — Telephone Encounter (Signed)
Referral done

## 2022-03-12 ENCOUNTER — Telehealth: Payer: Self-pay | Admitting: Internal Medicine

## 2022-03-12 NOTE — Telephone Encounter (Signed)
LVM for pt to rtn my call to schedule AWV with [facility] call back # [phone]

## 2022-03-22 ENCOUNTER — Telehealth: Payer: Self-pay

## 2022-03-22 ENCOUNTER — Ambulatory Visit (INDEPENDENT_AMBULATORY_CARE_PROVIDER_SITE_OTHER): Payer: Medicare Other

## 2022-03-22 VITALS — BP 110/80 | HR 80 | Temp 97.9°F | Ht 63.0 in | Wt 209.2 lb

## 2022-03-22 DIAGNOSIS — N644 Mastodynia: Secondary | ICD-10-CM

## 2022-03-22 DIAGNOSIS — Z0000 Encounter for general adult medical examination without abnormal findings: Secondary | ICD-10-CM | POA: Diagnosis not present

## 2022-03-22 NOTE — Telephone Encounter (Signed)
Spoke with patient's mother today during her AWV.  Patient is c/o left breast pain.  Breast pain level is 8.  Mother has tried to schedule mammogram to be evaluated but insurance will not cover due to age unless there is a letter of medical necessity for a left breast ultrasound.  Please contact patient's mother for more information and submit to [facility] for advise.  Mignon Pine, LPN.

## 2022-03-22 NOTE — Patient Instructions (Addendum)
Sandra Collins , Thank you for taking time to come for your Medicare Wellness Visit. I appreciate your ongoing commitment to your health goals. Please review the following plan we discussed and let me know if I can assist you in the future.   These are the goals we discussed:  Goals      Client understands the importance of follow-up with providers by attending scheduled visits     To lose weight.        This is a list of the screening recommended for you and due dates:  Health Maintenance  Topic Date Due   HIV Screening  Never done   Hepatitis C Screening: USPSTF Recommendation to screen - Ages 56-79 yo.  Never done   Tetanus Vaccine  Never done   Pap Smear  Never done   COVID-19 Vaccine (4 - Pfizer risk series) 06/28/2020   Medicare Annual Wellness Visit  03/23/2023   Flu Shot  Completed   HPV Vaccine  Aged Out    Advanced directives: No  Conditions/risks identified: Yes  Next appointment: Follow up in one year for your annual wellness visit.   Preventive Care 5-77 Years Old, Female Preventive care refers to lifestyle choices and visits with your health care provider that can promote health and wellness. Preventive care visits are also called wellness exams. What can I expect for my preventive care visit? Counseling During your preventive care visit, your health care provider may ask about your: Medical history, including: Past medical problems. Family medical history. Pregnancy history. Current health, including: Menstrual cycle. Method of birth control. Emotional well-being. Home life and relationship well-being. Sexual activity and sexual health. Lifestyle, including: Alcohol, nicotine or tobacco, and drug use. Access to firearms. Diet, exercise, and sleep habits. Work and work Statistician. Sunscreen use. Safety issues such as seatbelt and bike helmet use. Physical exam Your health care provider may check your: Height and weight. These may be used to  calculate your BMI (body mass index). BMI is a measurement that tells if you are at a healthy weight. Waist circumference. This measures the distance around your waistline. This measurement also tells if you are at a healthy weight and may help predict your risk of certain diseases, such as type 2 diabetes and high blood pressure. Heart rate and blood pressure. Body temperature. Skin for abnormal spots. What immunizations do I need? Vaccines are usually given at various ages, according to a schedule. Your health care provider will recommend vaccines for you based on your age, medical history, and lifestyle or other factors, such as travel or where you work. What tests do I need? Screening Your health care provider may recommend screening tests for certain conditions. This may include: Pelvic exam and Pap test. Lipid and cholesterol levels. Diabetes screening. This is done by checking your blood sugar (glucose) after you have not eaten for a while (fasting). Hepatitis B test. Hepatitis C test. HIV (human immunodeficiency virus) test. STI (sexually transmitted infection) testing, if you are at risk. BRCA-related cancer screening. This may be done if you have a family history of breast, ovarian, tubal, or peritoneal cancers. Talk with your health care provider about your test results, treatment options, and if necessary, the need for more tests. Follow these instructions at home: Eating and drinking  Eat a healthy diet that includes fresh fruits and vegetables, whole grains, lean protein, and low-fat dairy products. Take vitamin and mineral supplements as recommended by your health care provider. Do not drink alcohol if:  Your health care provider tells you not to drink. You are pregnant, may be pregnant, or are planning to become pregnant. If you drink alcohol: Limit how much you have to 0-1 drink a day. Know how much alcohol is in your drink. In the [place] one drink equals one 12 oz bottle  of beer (355 mL), one 5 oz glass of wine (148 mL), or one 1 oz glass of hard liquor (44 mL). Lifestyle Brush your teeth every morning and night with fluoride toothpaste. Floss one time each day. Exercise for at least 30 minutes 5 or more days each week. Do not use any products that contain nicotine or tobacco. These products include cigarettes, chewing tobacco, and vaping devices, such as e-cigarettes. If you need help quitting, ask your health care provider. Do not use drugs. If you are sexually active, practice safe sex. Use a condom or other form of protection to prevent STIs. If you do not wish to become pregnant, use a form of birth control. If you plan to become pregnant, see your health care provider for a prepregnancy visit. Find healthy ways to manage stress, such as: Meditation, yoga, or listening to music. Journaling. Talking to a trusted person. Spending time with friends and family. Minimize exposure to UV radiation to reduce your risk of skin cancer. Safety Always wear your seat belt while driving or riding in a vehicle. Do not drive: If you have been drinking alcohol. Do not ride with someone who has been drinking. If you have been using any mind-altering substances or drugs. While texting. When you are tired or distracted. Wear a helmet and other protective equipment during sports activities. If you have firearms in your house, make sure you follow all gun safety procedures. Seek help if you have been physically or sexually abused. What's next? Go to your health care provider once a year for an annual wellness visit. Ask your health care provider how often you should have your eyes and teeth checked. Stay up to date on all vaccines. This information is not intended to replace advice given to you by your health care provider. Make sure you discuss any questions you have with your health care provider. Document Revised: 11/09/2020 Document Reviewed: 11/09/2020 Elsevier  Patient Education  Sandra Collins.

## 2022-03-22 NOTE — Progress Notes (Addendum)
Subjective:   Sandra Collins is a 36 y.o. female who presents for an Initial Medicare Annual Wellness Visit.  Review of Systems     Cardiac Risk Factors include: dyslipidemia;obesity (BMI >30kg/m2);sedentary lifestyle     Objective:    Today's Vitals   03/22/22 1541  BP: 110/80  Pulse: 80  Temp: 97.9 F (36.6 C)  SpO2: 98%  Weight: 209 lb 3.2 oz (94.9 kg)  Height: '5\' 3"'$  (1.6 m)  PainSc: 8    Body mass index is 37.06 kg/m.     03/22/2022    4:18 PM 10/30/2021    3:24 PM 09/25/2021    5:53 AM 09/19/2021    2:16 PM 07/06/2021   10:24 AM 07/03/2021    2:00 PM 06/30/2021   10:11 AM  Advanced Directives  Does Patient Have a Medical Advance Directive? No No No No No No No  Would patient like information on creating a medical advance directive? No - Patient declined  No - Patient declined No - Patient declined No - Patient declined No - Patient declined No - Patient declined    Current Medications (verified) Outpatient Encounter Medications as of 03/22/2022  Medication Sig   Ascorbic Acid (VITAMIN C) 1000 MG tablet Take 1,000 mg by mouth daily.   aspirin EC 81 MG tablet Take 81 mg by mouth daily. Swallow whole.   b complex vitamins capsule Take 1 capsule by mouth daily.   Biotin 1000 MCG tablet Take 1,000 mcg by mouth daily.   Calcium Carb-Cholecalciferol (CALCIUM 600/VITAMIN D PO) Take 1 tablet by mouth daily.   cholecalciferol (VITAMIN D3) 25 MCG (1000 UNIT) tablet Take 1,000 Units by mouth daily.   [employer] FIBER GUMMY BEARS CHILDREN PO Take 2 each by mouth daily.   ibuprofen (ADVIL) 200 MG tablet Take 400 mg by mouth every 6 (six) hours as needed for headache.   levothyroxine (SYNTHROID) 75 MCG tablet Take 0.5 tablets (37.5 mcg total) by mouth daily before breakfast.   LORazepam (ATIVAN) 2 MG tablet Take 2 mg by mouth daily as needed for seizure.   medroxyPROGESTERone Acetate 150 MG/ML SUSY USE AS DIRECTED, USE 1 INJECTION IN THE MUSCLE EVERY 12 WEEKS   Multiple  Vitamins-Minerals (MULTIVITAMIN WITH MINERALS) tablet Take 1 tablet by mouth daily.   NON FORMULARY Place 90 mg under the tongue in the morning and at bedtime. CBD oil   Omega-3 Fatty Acids (FISH OIL) 1200 MG CPDR Take 1,200 mg by mouth daily.   topiramate (TOPAMAX) 100 MG tablet Take 1 tablet twice a day   vitamin B-12 (CYANOCOBALAMIN) 1000 MCG tablet Take 1,000 mcg by mouth daily.   No facility-administered encounter medications on file as of 03/22/2022.    Allergies (verified) Estrogens, Tape, Amoxicillin, Latex, Metronidazole, and Pregabalin   History: Past Medical History:  Diagnosis Date   Depression    Epilepsy ([place])    Headache    Hyperlipidemia    Hypothyroidism    Short-term memory loss    Caused by seizures   Thyroid cancer [facility])    Past Surgical History:  Procedure Laterality Date   BRAIN SURGERY  05/03/2006   IMPLANTATION VAGAL NERVE STIMULATOR  09/28/2015   IR 3D INDEPENDENT WKST  07/03/2021   IR ANGIO INTRA EXTRACRAN SEL INTERNAL CAROTID BILAT MOD SED  07/03/2021   IR ANGIO VERTEBRAL SEL VERTEBRAL UNI R MOD SED  07/03/2021   IR CT HEAD LTD  07/03/2021   IR RADIOLOGIST EVAL & MGMT  06/12/2021   IR  RADIOLOGIST EVAL & MGMT  07/25/2021   IR TRANSCATH/EMBOLIZ  07/03/2021   RADIOLOGY WITH ANESTHESIA N/A 07/03/2021   Procedure: Cerebral angiogram with intent to embolize brain aneurysm;  Surgeon: Luanne Bras, MD;  Location: [facility];  Service: Radiology;  Laterality: N/A;   THYROID SURGERY     cancer   VAGUS NERVE STIMULATOR INSERTION Left 09/25/2021   Procedure: Vagus Nerve Stimulator  Battery Change;  Surgeon: Consuella Lose, MD;  Location: [facility];  Service: Neurosurgery;  Laterality: Left;   Family History  Problem Relation Age of Onset   Cancer Mother    Social History   Socioeconomic History   Marital status: Single    Spouse name: Not on file   Number of children: 0   Years of education: 12   Highest education level: High school graduate  Occupational  History   Not on file  Tobacco Use   Smoking status: Never   Smokeless tobacco: Never  Vaping Use   Vaping Use: Never used  Substance and Sexual Activity   Alcohol use: Yes    Comment: rare   Drug use: Never   Sexual activity: Not on file  Other Topics Concern   Not on file  Social History Narrative   Right handed   Lives with mother in a one story townhome   Caffeine one a day   2 dogs   Social Determinants of Health   Financial Resource Strain: Low Risk  (03/22/2022)   Overall Financial Resource Strain (CARDIA)    Difficulty of Paying Living Expenses: Not hard at all  Food Insecurity: No Food Insecurity (03/22/2022)   Hunger Vital Sign    Worried About Running Out of Food in the Last Year: Never true    Ran Out of Food in the Last Year: Never true  Transportation Needs: No Transportation Needs (03/22/2022)   PRAPARE - Hydrologist (Medical): No    Lack of Transportation (Non-Medical): No  Physical Activity: Insufficiently Active (03/22/2022)   Exercise Vital Sign    Days of Exercise per Week: 1 day    Minutes of Exercise per Session: 30 min  Stress: No Stress Concern Present (03/22/2022)   [place]    Feeling of Stress : Not at all  Social Connections: Unknown (03/22/2022)   Social Connection and Isolation Panel [NHANES]    Frequency of Communication with Friends and Family: Patient refused    Frequency of Social Gatherings with Friends and Family: Patient refused    Attends Religious Services: Patient refused    Marine scientist or Organizations: No    Attends Music therapist: Never    Marital Status: Never married    Tobacco Counseling Counseling given: Not Answered   Clinical Intake:  Pre-visit preparation completed: Yes  Pain : No/denies pain Pain Score: 8      BMI - recorded: 37.06 Nutritional Status: BMI > 30  Obese Nutritional  Risks: None Diabetes: No  How often do you need to have someone help you when you read instructions, pamphlets, or other written materials from your doctor or pharmacy?: 1 - Never What is the last grade level you completed in school?: HSG  Diabetic? No  Interpreter Needed?: No  Information entered by :: Lisette Abu, LPN.   Activities of Daily Living    03/22/2022    3:43 PM 09/19/2021    2:20 PM  In your present state of health,  do you have any difficulty performing the following activities:  Hearing? 0   Vision? 0   Difficulty concentrating or making decisions? 1   Comment short term after seizures   Walking or climbing stairs? 0   Dressing or bathing? 0   Doing errands, shopping? 1 1  Comment does not drive due to seizures   Preparing Food and eating ? N   Using the Toilet? N   In the past six months, have you accidently leaked urine? N   Do you have problems with loss of bowel control? N   Managing your Medications? N   Managing your Finances? N   Housekeeping or managing your Housekeeping? N     Patient Care Team: Hoyt Koch, MD as PCP - General (Internal Medicine) Cameron Sprang, MD as Consulting Physician (Neurology) Keene Breath., MD as Consulting Physician (Ophthalmology) Freddie Breech, [place] as Consulting Physician (Otolaryngology)  Indicate any recent Medical Services you may have received from other than [facility] providers in the past year (date may be approximate).     Assessment:   This is a routine wellness examination for Keel[place].  Hearing/Vision screen Hearing Screening - Comments:: Denies hearing difficulties   Vision Screening - Comments:: No rx glasses needed at this time - up to date with routine eye exams with Barbie Haggis, MD.   Dietary issues and exercise activities discussed: Current Exercise Habits: Home exercise routine, Type of exercise: walking, Time (Minutes): 30, Frequency (Times/Week): 1, Weekly Exercise  (Minutes/Week): 30, Intensity: Mild, Exercise limited by: neurologic condition(s)   Goals Addressed             This Visit's Progress    Client understands the importance of follow-up with providers by attending scheduled visits       To lose weight.      Depression Screen    03/22/2022    3:42 PM 02/19/2022   10:00 AM 07/31/2021   10:12 AM 07/07/2021    3:27 PM 05/02/2021   10:27 AM  PHQ 2/9 Scores  PHQ - 2 Score 0 0 1 0 0  PHQ- 9 Score '3 3 1 '$ 0     Fall Risk    03/22/2022    4:21 PM 02/19/2022   10:01 AM 10/30/2021    3:23 PM 07/31/2021   10:12 AM 06/20/2021   11:15 AM  Fall Risk   Falls in the past year?  1 1 0 1  Comment seizure disorder      Number falls in past yr: '1 1 1  1  '$ Injury with Fall? 1 0 1  0  Risk for fall due to : History of fall(s);Mental status change      Follow up Falls evaluation completed;Falls prevention discussed;Education provided        FALL RISK PREVENTION PERTAINING TO THE HOME:  Any stairs in or around the home? No  If so, are there any without handrails? No  Home free of loose throw rugs in walkways, pet beds, electrical cords, etc? Yes  Adequate lighting in your home to reduce risk of falls? Yes   ASSISTIVE DEVICES UTILIZED TO PREVENT FALLS:  Life alert? No  Use of a cane, walker or w/c? No  Grab bars in the bathroom? No  Shower chair or bench in shower? No  Elevated toilet seat or a handicapped toilet? No   TIMED UP AND GO:  Was the test performed? Yes .  Length of time to ambulate 10 feet:  8 sec.   Gait steady and fast without use of assistive device  Cognitive Function:        03/22/2022    3:43 PM  6CIT Screen  What Year? 0 points  What month? 0 points  What time? 0 points  Count back from 20 0 points  Months in reverse 0 points  Repeat phrase 0 points  Total Score 0 points    Immunizations Immunization History  Administered Date(s) Administered   Influenza,inj,Quad PF,6+ Mos 05/02/2021, 02/19/2022    PFIZER(Purple Top)SARS-COV-2 Vaccination 09/01/2019, 09/22/2019, 05/03/2020    TDAP status: Never Done  Flu Vaccine status: Up to date  Pneumococcal Vaccine status: Never Done  Covid-19 vaccine status: Completed vaccines  Qualifies for Shingles Vaccine? No   Zostavax completed No   Shingrix Completed?: No.    Education has been provided regarding the importance of this vaccine. Patient has been advised to call insurance company to determine out of pocket expense if they have not yet received this vaccine. Advised may also receive vaccine at local pharmacy or Health Dept. Verbalized acceptance and understanding.  Screening Tests Health Maintenance  Topic Date Due   HIV Screening  Never done   Hepatitis C Screening  Never done   TETANUS/TDAP  Never done   PAP SMEAR-Modifier  Never done   COVID-19 Vaccine (4 - Pfizer risk series) 06/28/2020   Medicare Annual Wellness (AWV)  03/23/2023   INFLUENZA VACCINE  Completed   HPV VACCINES  Aged Out    Health Maintenance  Health Maintenance Due  Topic Date Due   HIV Screening  Never done   Hepatitis C Screening  Never done   TETANUS/TDAP  Never done   PAP SMEAR-Modifier  Never done   COVID-19 Vaccine (4 - Pfizer risk series) 06/28/2020    Colorectal cancer screening: due at age 37; unless there is a medical issue  Mammogram status: due at age 64; unless there is a medical issue  Bone Density status: due at age 43  Lung Cancer Screening: (Low Dose CT Chest recommended if Age 34-80 years, 30 pack-year currently smoking OR have quit w/in 15years.) does not qualify.   Lung Cancer Screening Referral: no  Additional Screening:  Hepatitis C Screening: does qualify; Completed no  Vision Screening: Recommended annual ophthalmology exams for early detection of glaucoma and other disorders of the eye. Is the patient up to date with their annual eye exam?  Yes  Who is the provider or what is the name of the office in which the patient  attends annual eye exams? Barbie Haggis, MD. If pt is not established with a provider, would they like to be referred to a provider to establish care? No .   Dental Screening: Recommended annual dental exams for proper oral hygiene  Community Resource Referral / Chronic Care Management: CRR required this visit?  No   CCM required this visit?  No      Plan:     I have personally reviewed and noted the following in the patient's chart:   Medical and social history Use of alcohol, tobacco or illicit drugs  Current medications and supplements including opioid prescriptions. Patient is not currently taking opioid prescriptions. Functional ability and status Nutritional status Physical activity Advanced directives List of other physicians Hospitalizations, surgeries, and ER visits in previous 12 months Vitals Screenings to include cognitive, depression, and falls Referrals and appointments  In addition, I have reviewed and discussed with patient certain preventive protocols, quality metrics, and best  practice recommendations. A written personalized care plan for preventive services as well as general preventive health recommendations were provided to patient.     Sheral Flow, LPN   [id number]/83/[id number]   Nurse Notes: N/A

## 2022-03-23 ENCOUNTER — Telehealth: Payer: Self-pay | Admitting: Neurology

## 2022-03-23 NOTE — Telephone Encounter (Signed)
Can you clarify what is needed from me?

## 2022-03-23 NOTE — Addendum Note (Signed)
Addended by: Pricilla Holm A on: 03/23/2022 09:35 AM   Modules accepted: Orders

## 2022-03-23 NOTE — Telephone Encounter (Signed)
[place] ordered this is appropriate first imaging for age group. Let patient know they will call to schedule.

## 2022-03-23 NOTE — Telephone Encounter (Signed)
Pt called in stating she has an ultrasound scheduled at [facility] on 03/28/22 and she is wanting to find out if she should have her VNS turned on or off?

## 2022-03-26 NOTE — Telephone Encounter (Signed)
Pls let her know I contacted the VNS representative, VNS does not need to be turned off for ultrasound. thanks

## 2022-03-26 NOTE — Telephone Encounter (Signed)
Pt called an informed that Dr Delice Lesch contacted the VNS representative, VNS does not need to be turned off for ultrasound. PT verbalized understanding

## 2022-03-28 ENCOUNTER — Other Ambulatory Visit: Payer: Self-pay | Admitting: Internal Medicine

## 2022-03-28 ENCOUNTER — Ambulatory Visit
Admission: RE | Admit: 2022-03-28 | Discharge: 2022-03-28 | Disposition: A | Discharge status: Home / Self Care | Payer: Medicare Other | Source: Ambulatory Visit | Attending: Internal Medicine | Admitting: Internal Medicine

## 2022-03-28 DIAGNOSIS — N644 Mastodynia: Secondary | ICD-10-CM

## 2022-03-28 DIAGNOSIS — N6320 Unspecified lump in the left breast, unspecified quadrant: Secondary | ICD-10-CM

## 2022-04-23 ENCOUNTER — Telehealth: Payer: Self-pay | Admitting: Internal Medicine

## 2022-04-23 NOTE — Telephone Encounter (Signed)
Pt called to report difficulty sleeping. Pt asking about 2 medications to see if either are OK to take. Is melatonin or relaxim sleep tablet OK with Topamax? Pt call back number: [phone]

## 2022-04-23 NOTE — Telephone Encounter (Signed)
Melatonin is okay

## 2022-04-24 ENCOUNTER — Telehealth: Payer: Self-pay | Admitting: Anesthesiology

## 2022-04-24 NOTE — Telephone Encounter (Signed)
Spoke with the pt and was able to inform her that MELATONIN is okay to take per provider.

## 2022-04-24 NOTE — Telephone Encounter (Signed)
Pt left message stating her PCP has prescribed her Melatonin to help with her sleep. She would like to ask Dr Delice Lesch what dosage she suggest of Melatonin she take that wont interfere with her Topiramate.

## 2022-04-26 NOTE — Telephone Encounter (Signed)
She can start with '3mg'$ . If no improvement, can try '5mg'$ . Would not go above '10mg'$ . Thanks

## 2022-04-26 NOTE — Telephone Encounter (Signed)
Pt called informed that she can start with '3mg'$ . If no improvement, can try '5mg'$ . Would not go above '10mg'$  of the melatonin

## 2022-04-27 ENCOUNTER — Other Ambulatory Visit: Payer: Self-pay | Admitting: Internal Medicine

## 2022-05-22 ENCOUNTER — Ambulatory Visit: Payer: Medicare Other | Admitting: Neurology

## 2022-05-25 ENCOUNTER — Encounter: Payer: Self-pay | Admitting: Neurology

## 2022-05-25 ENCOUNTER — Ambulatory Visit (INDEPENDENT_AMBULATORY_CARE_PROVIDER_SITE_OTHER): Payer: Medicare Other | Admitting: Neurology

## 2022-05-25 VITALS — BP 122/78 | HR 90 | Ht 63.0 in | Wt 207.2 lb

## 2022-05-25 DIAGNOSIS — G40219 Localization-related (focal) (partial) symptomatic epilepsy and epileptic syndromes with complex partial seizures, intractable, without status epilepticus: Secondary | ICD-10-CM | POA: Diagnosis not present

## 2022-05-25 NOTE — Progress Notes (Signed)
NEUROLOGY FOLLOW UP OFFICE NOTE  Sandra Collins [id number] [birth year]  HISTORY OF PRESENT ILLNESS: I had the pleasure of seeing Sandra Collins in follow-up in the neurology clinic on 05/25/2022.  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 She is again accompanied by her mother who helps supplement the history today.  Records and images were personally reviewed where available.  Since her last visit, she and her mother report that she has been having difficulty controlling her hormones again. This had happened in the past after she did 6 years of medroxyprogesterone and was on max doses of Topiramate and Lamotrigine but seizures were occurring daily. She had to stop the medroxyprogesterone for a period of time. She was not on the CBD at that time. After she started CBD, symptoms stabilized. Recently, they report that she has again completed 6 years of medroxyprogesterone and now is having breakthrough spotting and difficulty with temperature control. Over the years, they have found that when her temperature goes above 99, she starts having issues. Recently, her temperature has been rising and she has been taking Ibuprofen more. On her last visit, she also expressed interest in weaning down Topiramate dose, she was previously on '200mg'$  BID, now on '100mg'$  BID. She had been seizure-free for over a year until she had 2 seizures in November, on 11/12 she had to take Ativan then fell when she got home. On 11/26, she felt her VNS go off at 4am and 5am. Then she had a period on 11/27 (which they report should not be happening). She had her Depo shot on 12/4. She had a convulsion on 12/24 while in the car, which she felt coming on. She took an Ativan. On 12/25, she had another convulsion in her sleep. No tongue bite or incontinence. She is also seeing Endocrinology for her thyroid. She will be having a mammogram soon.    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 Records from Dr. Amalia Hailey and provided by her mother were reviewed. Seizures started at 39 months of age (10/1988). She was started on Depakote in 07/1989. She underwent EMU monitoring in [place] in 2007 and had left temporal lobe surgery in [place], [place] that was unsuccessful. VNS was placed in 2017. Main trigger noted for her seizures is a temperature of 99 degrees, whether from illness, physical activity, stress, or menstrual period. She started a CBD schedule in 2020 with her neurologist in [place] and they were able to wean off Lamotrigine in 2021. She continues on Topiramate '200mg'$  BID and CBD '70mg'$  BID. CBD is obtained from [employer] in [place].  Seizure semiology has included staring with bilateral hand automatisms followed by generalized tonic posturing, and drop attacks. She will have an aura of grabbing on to something then has loss of consciousness. Her mother notes shakiness and pupils dilate. On her last visit with Dr. Amalia Hailey in 08/2020, they reported her last seizure with loss of consciousness/GTC was in 12/2019, however today report that she had 4 seizures last year with spacing out or behavioral arrest for 15 seconds. She had a seizure yesterday in her sleep occurring on and off from 2:30am to 6am. She almost fell off the bed. She was biting her pillows and teddy bear trying to stay calm. She could feel her body shaking a little bit  for 15-30 seconds, occurring repeatedly and waking her up. She did have a low grade temperature and took Ibuprofen and '2mg'$  lorazepam, with no further events for the rest of the day. She denies any loss of consciousness with the recent seizures. She has rare body jerks. She denies any olfactory/gustatory hallucinations, focal numbness/tingling/weakness. She has rare migraines and takes  Ibuprofen with good response. She has some swallowing difficulties due to her thyroid issues, and back pain due to scoliosis. No bowel/bladder dysfunction. Sleep is good. Mood is alright. She manages her own medications without issues.She lives with her mother and seizure dog. She does not drive.   Epilepsy Risk Factors:  A paternal aunt had epilepsy in childhood, 2 paternal cousins have epilepsy. She had a febrile convulsion in childhood. Otherwise she had a normal birth with no history of CNS infections such as meningitis/encephalitis, significant traumatic brain injury, neurosurgical procedures  Prior ASMs: Lamotrigine, Sabril, Dilantin, Trileptal, Lyrica, Onfi, Depakote, Zarontin, Felbatol, Neurontin, Tegretol, Phenobarbital   PAST MEDICAL HISTORY: Past Medical History:  Diagnosis Date   Depression    Epilepsy ([place])    Headache    Hyperlipidemia    Hypothyroidism    Short-term memory loss    Caused by seizures   Thyroid cancer ([place])     MEDICATIONS: Current Outpatient Medications on File Prior to Visit  Medication Sig Dispense Refill   Ascorbic Acid (VITAMIN C) 1000 MG tablet Take 1,000 mg by mouth daily.     aspirin EC 81 MG tablet Take 81 mg by mouth daily. Swallow whole.     b complex vitamins capsule Take 1 capsule by mouth daily.     Biotin 1000 MCG tablet Take 1,000 mcg by mouth daily.     Calcium Carb-Cholecalciferol (CALCIUM 600/VITAMIN D PO) Take 1 tablet by mouth daily.     cholecalciferol (VITAMIN D3) 25 MCG (1000 UNIT) tablet Take 1,000 Units by mouth daily.     ibuprofen (ADVIL) 200 MG tablet Take 400 mg by mouth every 6 (six) hours as needed for headache.     levothyroxine (SYNTHROID) 50 MCG tablet Take 50 mcg by mouth daily.     LORazepam (ATIVAN) 2 MG tablet Take 2 mg by mouth daily as needed for seizure.     medroxyPROGESTERone Acetate 150 MG/ML SUSY ADMINISTER 1 INJECTION IN THE MUSCLE EVERY 12 WEEKS AS DIRECTED 1 mL 0   Melatonin 10 MG TABS Take 10 mg by mouth  at bedtime.     Multiple Vitamins-Minerals (MULTIVITAMIN WITH MINERALS) tablet Take 1 tablet by mouth daily.     NON FORMULARY Place 90 mg under the tongue in the morning and at bedtime. CBD oil     Omega-3 Fatty Acids (FISH OIL) 1200 MG CPDR Take 1,200 mg by mouth daily.     topiramate (TOPAMAX) 100 MG tablet Take 1 tablet twice a day 180 tablet 3   vitamin B-12 (CYANOCOBALAMIN) 1000 MCG tablet Take 1,000 mcg by mouth daily.     No current facility-administered medications on file prior to visit.    ALLERGIES: Allergies  Allergen Reactions   Estrogens Other (See Comments)    Seizures   Tape Hives, Itching, Swelling and Rash    Must use paper tape.    Amoxicillin Hives   Latex Hives   Metronidazole Hives   Pregabalin Other (See Comments)    Excessive weight gain      FAMILY HISTORY: Family History  Problem Relation Age of Onset   Cancer Mother  SOCIAL HISTORY: Social History   Socioeconomic History   Marital status: Single    Spouse name: Not on file   Number of children: 0   Years of education: 12   Highest education level: High school graduate  Occupational History   Not on file  Tobacco Use   Smoking status: Never   Smokeless tobacco: Never  Vaping Use   Vaping Use: Never used  Substance and Sexual Activity   Alcohol use: Yes    Comment: rare   Drug use: Never   Sexual activity: Not on file  Other Topics Concern   Not on file  Social History Narrative   Right handed   Lives with mother in a one story townhome   Caffeine one a day   2 dogs   Social Determinants of Health   Financial Resource Strain: Low Risk  (03/22/2022)   Overall Financial Resource Strain (CARDIA)    Difficulty of Paying Living Expenses: Not hard at all  Food Insecurity: No Food Insecurity (03/22/2022)   Hunger Vital Sign    Worried About Running Out of Food in the Last Year: Never true    Ran Out of Food in the Last Year: Never true  Transportation Needs: No Transportation  Needs (03/22/2022)   PRAPARE - Hydrologist (Medical): No    Lack of Transportation (Non-Medical): No  Physical Activity: Insufficiently Active (03/22/2022)   Exercise Vital Sign    Days of Exercise per Week: 1 day    Minutes of Exercise per Session: 30 min  Stress: No Stress Concern Present (03/22/2022)   [place]    Feeling of Stress : Not at all  Social Connections: Unknown (03/22/2022)   Social Connection and Isolation Panel [NHANES]    Frequency of Communication with Friends and Family: Patient refused    Frequency of Social Gatherings with Friends and Family: Patient refused    Attends Religious Services: Patient refused    Marine scientist or Organizations: No    Attends Archivist Meetings: Never    Marital Status: Never married  Intimate Partner Violence: Not At Risk (03/22/2022)   Humiliation, Afraid, Rape, and Kick questionnaire    Fear of Current or Ex-Partner: No    Emotionally Abused: No    Physically Abused: No    Sexually Abused: No     PHYSICAL EXAM: Vitals:   05/25/22 0810  BP: 122/78  Pulse: 90  SpO2: 99%   General: No acute distress Head:  Normocephalic/atraumatic Skin/Extremities: No rash, no edema Neurological Exam: alert and awake. No aphasia or dysarthria. Fund of knowledge is appropriate.  Attention and concentration are normal.   Cranial nerves: Pupils equal, round. Extraocular movements intact with no nystagmus. Visual fields full.  No facial asymmetry.  Motor: Bulk and tone normal, muscle strength 5/5 throughout with no pronator drift.   Finger to nose testing intact.  Gait narrow-based and steady, able to tandem walk adequately.  Romberg negative.   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 MRI brain did not show any significant intracranial abnormalities. 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 She had been doing well seizure-free for over a year until last month when she started having seizures, as well as difficulty with regulating body temperature, which is a trigger for her seizures. She had requested to wean down  Topiramate on last visit. They report she is having a lot of hormonal changes, her Medroxyprogesterone needs addressing. She plans to see a gynecologist soon. She also follows with Endocrinology. We discussed the need at this time to increase Topiramate back up, she would like to increase to '150mg'$  BID and monitor seizures over the next month, and if no improvement, increase back to '200mg'$  BID.  She is on CBD '70mg'$  BID obtained from [facility] in [place]. She reports 2 of her VNS magnets are not working, she was given a new box of magnets today. She does not drive. Follow-up in 3-4 months, call for any changes.    Thank you for allowing me to participate in her care.  Please do not hesitate to call for any questions or concerns.    Ellouise Newer, M.D.   CC: Dr. Sharlet Salina

## 2022-05-25 NOTE — Patient Instructions (Signed)
Good to see you.   Increase Topiramate to '150mg'$  twice a day. See how the seizures are over the next month, if no improvement, increase back to '200mg'$  twice a day.  2. Proceed with gynecology evaluation. Let me know if you need a referral  3. Continue follow-up with Endocrinologist  4. Follow-up in 3-4 months, call for any changes   Seizure Precautions: 1. If medication has been prescribed for you to prevent seizures, take it exactly as directed.  Do not stop taking the medicine without talking to your doctor first, even if you have not had a seizure in a long time.   2. Avoid activities in which a seizure would cause danger to yourself or to others.  Don't operate dangerous machinery, swim alone, or climb in high or dangerous places, such as on ladders, roofs, or girders.  Do not drive unless your doctor says you may.  3. If you have any warning that you may have a seizure, lay down in a safe place where you can't hurt yourself.    4.  No driving for 6 months from last seizure, as per [facility].   Please refer to the following link on the [employer] website for more information: http://[email or web address]/[email or web address]/drivingu.cfm   5.  Maintain good sleep hygiene. Avoid alcohol.  6.  Notify your neurology if you are planning pregnancy or if you become pregnant.  7.  Contact your doctor if you have any problems that may be related to the medicine you are taking.  8.  Call 911 and bring the patient back to the ED if:        A.  The seizure lasts longer than 5 minutes.       B.  The patient doesn't awaken shortly after the seizure  C.  The patient has new problems such as difficulty seeing, speaking or moving  D.  The patient was injured during the seizure  E.  The patient has a temperature over 102 F (39C)  F.  The patient vomited and now is having trouble breathing

## 2022-05-30 ENCOUNTER — Ambulatory Visit
Admission: RE | Admit: 2022-05-30 | Discharge: 2022-05-30 | Disposition: A | Discharge status: Home / Self Care | Payer: Medicare Other | Source: Ambulatory Visit | Attending: Internal Medicine | Admitting: Internal Medicine

## 2022-05-30 ENCOUNTER — Other Ambulatory Visit: Payer: Self-pay | Admitting: Otolaryngology

## 2022-05-30 ENCOUNTER — Ambulatory Visit
Admission: RE | Admit: 2022-05-30 | Discharge: 2022-05-30 | Disposition: A | Discharge status: Home / Self Care | Payer: Medicare Other | Source: Ambulatory Visit | Attending: Otolaryngology | Admitting: Otolaryngology

## 2022-05-30 ENCOUNTER — Other Ambulatory Visit: Payer: Self-pay

## 2022-05-30 ENCOUNTER — Ambulatory Visit (HOSPITAL_COMMUNITY)
Admission: RE | Admit: 2022-05-30 | Discharge: 2022-05-30 | Disposition: A | Discharge status: Home / Self Care | Payer: Medicare Other | Source: Ambulatory Visit | Attending: Otolaryngology | Admitting: Otolaryngology

## 2022-05-30 DIAGNOSIS — E041 Nontoxic single thyroid nodule: Secondary | ICD-10-CM

## 2022-05-30 DIAGNOSIS — C73 Malignant neoplasm of thyroid gland: Secondary | ICD-10-CM

## 2022-05-30 DIAGNOSIS — N6320 Unspecified lump in the left breast, unspecified quadrant: Secondary | ICD-10-CM

## 2022-06-04 NOTE — Progress Notes (Unsigned)
Name: Sandra Collins  MRN/ DOB: [id number], December 24, 1985    Age/ Sex: 37 y.o., female    PCP: Hoyt Koch, MD   Reason for Endocrinology Evaluation: PTC     Date of Initial Endocrinology Evaluation: 06/05/2022     HPI: Ms. Sandra Collins is a 37 y.o. female with a past medical history of Hx brain aneurysm repair, seizure d/o. The patient presented for initial endocrinology clinic visit on 06/05/2022 for consultative assistance with her PTC.   Patient is s/p right hemithyroidectomy on 12//2018 while living in [place] with PTC, multifocal, no extrathyroidal extension, no angioinvasion, no lymphatic invasion, report stated perineural invasion could not be determined.  Tumor greatest size 1.1 cm.  Thyroid ultrasound prior to surgery demonstrated a right thyroid nodule 1.4 x 1.1 x 0.9 cm with no nodularity on the left  In 2022 and per  review of her records through care everywhere she had comprehensive workup for left cervical adenopathy which included ultrasound, CT neck, FNA with subsequent flow cytometry.  Historically Tg Ab undetectable as of 01/2022 with a Tg of 17.2 ng/mL    Thyroid ultrasound 01/2022 did not show any evidence of recurrent on the right but involution of left thyroid nodule.    Today she is accompanied by her mother Sherril Cong)  She takes levothyroxine on empty stomach in the morning but she does not separate her vitamins from levothyroxine by at least 4 hours.  She is also on biotin  Patient with history of seizures that are usually noted the week prior and the week of menstruations hence she is on medroxyprogesterone injections to prevent menstruations , it appears that there is a 7-year limit on medroxyprogesterone use and this is her seventh year .  Patient and mother are anxious about was the next step, she follows with gynecology   She has noted hirsutism on toes, face and nipples, which is new and was started few months ago  She did not respond  well to OCP's   She has occasional dysphagia  She has occasional diarrhea  Denies palpitations  Has occasional tremors  Weight has been stable   Mother with metastatic breast ca   Materna FH of  Goiter and thyroid disease    Levothyroxine 50 mcg daily   HISTORY:  Past Medical History:  Past Medical History:  Diagnosis Date   Depression    Epilepsy ([place])    Headache    Hyperlipidemia    Hypothyroidism    Short-term memory loss    Caused by seizures   Thyroid cancer [facility])    Past Surgical History:  Past Surgical History:  Procedure Laterality Date   BRAIN SURGERY  05/03/2006   IMPLANTATION VAGAL NERVE STIMULATOR  09/28/2015   IR 3D INDEPENDENT WKST  07/03/2021   IR ANGIO INTRA EXTRACRAN SEL INTERNAL CAROTID BILAT MOD SED  07/03/2021   IR ANGIO VERTEBRAL SEL VERTEBRAL UNI R MOD SED  07/03/2021   IR CT HEAD LTD  07/03/2021   IR RADIOLOGIST EVAL & MGMT  06/12/2021   IR RADIOLOGIST EVAL & MGMT  07/25/2021   IR TRANSCATH/EMBOLIZ  07/03/2021   RADIOLOGY WITH ANESTHESIA N/A 07/03/2021   Procedure: Cerebral angiogram with intent to embolize brain aneurysm;  Surgeon: Luanne Bras, MD;  Location: [facility];  Service: Radiology;  Laterality: N/A;   THYROID SURGERY     cancer   VAGUS NERVE STIMULATOR INSERTION Left 09/25/2021   Procedure: Vagus Nerve Stimulator  Battery Change;  Surgeon: Kathyrn Sheriff,  Nena Polio, MD;  Location: [facility];  Service: Neurosurgery;  Laterality: Left;    Social History:  reports that she has never smoked. She has never used smokeless tobacco. She reports current alcohol use. She reports that she does not use drugs. Family History: family history includes Breast cancer in her mother; Cancer in her mother.   HOME MEDICATIONS: Allergies as of 06/05/2022       Reactions   Estrogens Other (See Comments)   Seizures   Tape Hives, Itching, Swelling, Rash   Must use paper tape.    Amoxicillin Hives   Latex Hives   Metronidazole Hives   Pregabalin Other (See Comments)    Excessive weight gain        Medication List        Accurate as of June 05, 2022 11:17 AM. If you have any questions, ask your nurse or doctor.          aspirin EC 81 MG tablet Take 81 mg by mouth daily. Swallow whole.   b complex vitamins capsule Take 1 capsule by mouth daily.   Biotin 1000 MCG tablet Take 1,000 mcg by mouth daily.   CALCIUM 600/VITAMIN D PO Take 1 tablet by mouth daily.   cholecalciferol 25 MCG (1000 UNIT) tablet Commonly known as: VITAMIN D3 Take 1,000 Units by mouth daily.   cyanocobalamin 1000 MCG tablet Commonly known as: VITAMIN B12 Take 1,000 mcg by mouth daily.   Fish Oil 1200 MG Cpdr Take 1,200 mg by mouth daily.   ibuprofen 200 MG tablet Commonly known as: ADVIL Take 400 mg by mouth every 6 (six) hours as needed for headache.   levothyroxine 50 MCG tablet Commonly known as: SYNTHROID Take 50 mcg by mouth daily.   LORazepam 2 MG tablet Commonly known as: ATIVAN Take 2 mg by mouth daily as needed for seizure.   medroxyPROGESTERone Acetate 150 MG/ML [place] ADMINISTER 1 INJECTION IN THE MUSCLE EVERY 12 WEEKS AS DIRECTED   Melatonin 10 MG Tabs Take 10 mg by mouth at bedtime.   multivitamin with minerals tablet Take 1 tablet by mouth daily.   NON FORMULARY Place 90 mg under the tongue in the morning and at bedtime. CBD oil   topiramate 100 MG tablet Commonly known as: TOPAMAX Take 1 tablet twice a day What changed:  how much to take how to take this when to take this   vitamin C 1000 MG tablet Take 1,000 mg by mouth daily.          REVIEW OF SYSTEMS: A comprehensive ROS was conducted with the patient and is negative except as per HPI     OBJECTIVE:  VS: BP 120/70 (BP Location: Left Arm, Patient Position: Sitting, Cuff Size: Large)   Pulse 86   Ht '5\' 3"'$  (1.6 m)   Wt 213 lb (96.6 kg)   SpO2 99%   BMI 37.73 kg/m    Wt Readings from Last 3 Encounters:  06/05/22 213 lb (96.6 kg)  05/25/22 207 lb 3.2 oz (94  kg)  03/22/22 209 lb 3.2 oz (94.9 kg)     EXAM: General: Pt appears well and is in NAD  Eyes: External eye exam normal without stare, lid lag or exophthalmos.  EOM intact.  PERRL.  Neck: General: Supple without adenopathy. Thyroid: Thyroid size normal.  No goiter or nodules appreciated.   Lungs: Clear with good BS bilat with no rales, rhonchi, or wheezes  Heart: Auscultation: RRR.  Abdomen: Normoactive bowel sounds, soft, nontender,  without masses or organomegaly palpable  Extremities:  BL LE: No pretibial edema normal ROM and strength.  Mental Status: Judgment, insight: Intact Orientation: Oriented to time, place, and person Mood and affect: No depression, anxiety, or agitation     DATA REVIEWED: ***    Thyroid Ultrasound 01/31/2022  Estimated total number of nodules >/= 1 cm: 0   Number of spongiform nodules >/=  2 cm not described below (TR1): 0   Number of mixed cystic and solid nodules >/= 1.5 cm not described below ([place]): 0   _________________________________________________________   Decreasing size of previously mixed cystic and solid nodule in the left mid gland. The cystic component is largely resolved leaving a subcentimeter residual hypoechoic nodule. No new nodules or suspicious features are identified.   Interrogation of the right thyroid resection bed demonstrates no evidence of residual or recurrent thyroid tissue or nodularity.   IMPRESSION: 1. Surgical changes of right hemithyroidectomy without evidence of residual or recurrent thyroid tissue. 2. Involuting left mid thyroid nodule. Involution over time is consistent with a benign process.    Old records , labs and images have been reviewed.   ASSESSMENT/PLAN/RECOMMENDATIONS:   Hx of PTC:  -She is s/p right hemithyroidectomy in 2018 due to [facility] -She is at low risk for recurrence -No biochemical or structural evidence of recurrence -Last thyroid ultrasound 01/2022 no evidence of recurrence on  the right, evaluation of the left thyroid nodule -TSH goal 0.5-2.0 u IU/mL -- Pt educated extensively on the correct way to take levothyroxine (first thing in the morning with water, 30 minutes before eating or taking other medications). - Pt encouraged to double dose the following day if she were to miss a dose given long half-life of levothyroxine. -Patient also advised to hold biotin 2-3 days prior to any future thyroid lab work  Medications : Levothyroxine 50 MCG daily  2. Hirsutism:  -Patient with hirsutism as well as fat pad deposit at the base of the neck -Will proceed with screening for Cushing syndrome with saliva cortisol, will also check testosterone and DHEA-S -We did discuss the first-line of treatment of hirsutism is combined oral contraceptive pills, we also discussed spironolactone as a second line of treatment but it is contraindicated without birth control due to 2 out of genic effects   F/U in 6 months    Signed electronically by: Mack Guise, MD  [facility]  [address] [place], [place] [address] Phone: [phone] FAX: [phone]   CC: Hoyt Koch, [address] [place] [address] Phone: [phone] Fax: [phone]   Return to Endocrinology clinic as below: Future Appointments  Date Time Provider [place]  06/14/2022  8:30 AM LB ENDO/NEURO LAB [facility] None  06/22/2022 10:00 AM Salvadore Dom, MD [facility] None  08/20/2022  9:40 AM Hoyt Koch, MD [facility] None  08/29/2022 11:30 AM Cameron Sprang, MD [facility] None  12/06/2022 10:10 AM Talha Iser, Melanie Crazier, MD [facility] None

## 2022-06-05 ENCOUNTER — Encounter: Payer: Self-pay | Admitting: Internal Medicine

## 2022-06-05 ENCOUNTER — Ambulatory Visit (INDEPENDENT_AMBULATORY_CARE_PROVIDER_SITE_OTHER): Payer: Medicare Other | Admitting: Internal Medicine

## 2022-06-05 VITALS — BP 120/70 | HR 86 | Ht 63.0 in | Wt 213.0 lb

## 2022-06-05 DIAGNOSIS — C73 Malignant neoplasm of thyroid gland: Secondary | ICD-10-CM

## 2022-06-05 DIAGNOSIS — L680 Hirsutism: Secondary | ICD-10-CM | POA: Diagnosis not present

## 2022-06-05 DIAGNOSIS — E890 Postprocedural hypothyroidism: Secondary | ICD-10-CM

## 2022-06-05 NOTE — Patient Instructions (Addendum)
      SALIVARY CORTISOL COLLECTION INSTRUCTIONS    Precautions:  Please collect sample at Midnight . You will need to do this on 2 nights  No food or fluids 30 minutes prior to collection.  Do not use any creams, lotions on hands, or use steroid inhalers 24- hours prior to collection.  Wash hands carefully.  Avoid any activity that could cause your gums to bleed: including flushing of brushing your teeth.  Kit must not be used in children less than 3 years of age, or a person that is at risk for choking on collection kit.  Instructions for saliva collection:   Rinse mouth thoroughly with water and discard. Do not swallow.  Hold the [place]ivette at the rim of the suspended insert and remove the stopper.  Remove the swab.  Place swab under tongue until well saturated, approximately 1 minute.   Return the saturated swab to the suspended insert and close the [place]ivette firmly with the stopper.  Do not remove the tube holding the insert. The Salivette should be sent to the lab with the swab.   Come to the lab and leave the Salivette kit for labeling with your identifying information.   Make sure you refrigerate sample if not bringing to the lab immediately. Try to use cold packs for transportation if available.     

## 2022-06-12 ENCOUNTER — Other Ambulatory Visit: Payer: Self-pay | Admitting: Internal Medicine

## 2022-06-12 DIAGNOSIS — N6489 Other specified disorders of breast: Secondary | ICD-10-CM

## 2022-06-13 ENCOUNTER — Encounter: Payer: Self-pay | Admitting: Neurology

## 2022-06-14 ENCOUNTER — Other Ambulatory Visit (INDEPENDENT_AMBULATORY_CARE_PROVIDER_SITE_OTHER): Payer: Medicare Other

## 2022-06-14 DIAGNOSIS — L680 Hirsutism: Secondary | ICD-10-CM | POA: Diagnosis not present

## 2022-06-14 DIAGNOSIS — E890 Postprocedural hypothyroidism: Secondary | ICD-10-CM | POA: Diagnosis not present

## 2022-06-14 DIAGNOSIS — C73 Malignant neoplasm of thyroid gland: Secondary | ICD-10-CM

## 2022-06-14 LAB — TSH: TSH: 2.04 u[IU]/mL (ref 0.35–5.50)

## 2022-06-17 LAB — TESTOSTERONE, TOTAL, LC/MS/MS: Testosterone, Total, LC-MS-MS: 24 ng/dL (ref 2–45)

## 2022-06-17 LAB — DHEA-SULFATE: DHEA-SO4: 120 ug/dL (ref 19–237)

## 2022-06-17 LAB — THYROGLOBULIN LEVEL: Thyroglobulin: 8.1 ng/mL

## 2022-06-17 LAB — THYROGLOBULIN ANTIBODY: Thyroglobulin Ab: <1 IU/mL (ref <=1)

## 2022-06-17 LAB — 17-HYDROXYPROGESTERONE: 17-OH-Progesterone, LC/MS/MS: 27 ng/dL

## 2022-06-18 LAB — SALIVARY CORTISOL X2, TIMED
Salivary Cortisol 2nd Specimen: 0.053 ug/dL
Salivary Cortisol Baseline: 0.102 ug/dL

## 2022-06-21 ENCOUNTER — Telehealth: Payer: Self-pay

## 2022-06-21 MED ORDER — LEVOTHYROXINE SODIUM 50 MCG PO TABS: 50.0000 ug | ORAL_TABLET | Freq: Every day | ORAL | 3 refills | Status: DC

## 2022-06-21 NOTE — Telephone Encounter (Signed)
Patient called to see if you were going to keep her on the same dose of Levothyroxine after her lab work and if so she needs a script sent to pharmacy.

## 2022-06-22 ENCOUNTER — Ambulatory Visit (INDEPENDENT_AMBULATORY_CARE_PROVIDER_SITE_OTHER): Payer: Medicare Other | Admitting: Obstetrics and Gynecology

## 2022-06-22 ENCOUNTER — Encounter: Payer: Self-pay | Admitting: Obstetrics and Gynecology

## 2022-06-22 VITALS — BP 110/76 | HR 86 | Ht 62.5 in | Wt 204.0 lb

## 2022-06-22 DIAGNOSIS — Z01419 Encounter for gynecological examination (general) (routine) without abnormal findings: Secondary | ICD-10-CM

## 2022-06-22 DIAGNOSIS — N949 Unspecified condition associated with female genital organs and menstrual cycle: Secondary | ICD-10-CM

## 2022-06-22 DIAGNOSIS — N952 Postmenopausal atrophic vaginitis: Secondary | ICD-10-CM

## 2022-06-22 DIAGNOSIS — R569 Unspecified convulsions: Secondary | ICD-10-CM

## 2022-06-22 DIAGNOSIS — R300 Dysuria: Secondary | ICD-10-CM

## 2022-06-22 DIAGNOSIS — I671 Cerebral aneurysm, nonruptured: Secondary | ICD-10-CM | POA: Diagnosis not present

## 2022-06-22 DIAGNOSIS — Z3009 Encounter for other general counseling and advice on contraception: Secondary | ICD-10-CM

## 2022-06-22 LAB — WET PREP FOR TRICH, YEAST, CLUE

## 2022-06-22 LAB — URINALYSIS, COMPLETE
Bacteria, UA: NONE SEEN /HPF
Bilirubin Urine: NEGATIVE
Glucose, UA: NEGATIVE
Hgb urine dipstick: NEGATIVE
Hyaline Cast: NONE SEEN /LPF
Ketones, ur: NEGATIVE
Leukocytes,Ua: NEGATIVE
Nitrite: NEGATIVE
Protein, ur: NEGATIVE
RBC / HPF: NONE SEEN /HPF (ref 0–2)
Specific Gravity, Urine: 1.025 (ref 1.001–1.035)
WBC, UA: NONE SEEN /HPF (ref 0–5)
pH: 6 (ref 5.0–8.0)

## 2022-06-22 MED ORDER — ESTRADIOL 10 MCG VA TABS: 1.0000 | ORAL_TABLET | VAGINAL | 0 refills | Status: DC

## 2022-06-22 NOTE — Patient Instructions (Signed)

## 2022-06-22 NOTE — Progress Notes (Signed)
37 y.o. No obstetric history on file. Single White or Caucasian Not Hispanic or Latino female here to discuss hormones. She has seziures that are brought on by her cycle. She uses the depo to stop her cycle so that she doesn't have seizures. She can go on Depo for about 5  years and then she has to come off it.   She has been on depo-provera on and off for years. One time she went off of depo-provera secondary to vaginal atrophy. Needed PT and vaginal dilators.  She hasn't been sexually active, no pain. No STD risks.   Being on the depo-provera helps prevents her seizures. She self treats with the depo, she sometimes takes the shot 1-2 weeks early if she starts cramping or spotting.   She has petite mal and grand mal seizures. If her temp gets to 99 she will have a seizure. Occurs 3-4 x a month.   Ready to start dating again.    She has a h/o a brain aneurysm repair, seizures and thyroid cancer.   No LMP recorded. Patient has had an injection.          Sexually active: No.  The current method of family planning is Depo-Provera injections.    Exercising: Yes.     Walking  Smoker:  no  Health Maintenance: Pap:  02/23/20 wnl hr hpv neg at [facility].  History of abnormal Pap:  no MMG:  05/30/22 see epic. Her mother has breast cancer  BMD:   none  Colonoscopy: none  TDaP:  2021 Gardasil: complete    reports that she has never smoked. She has never used smokeless tobacco. She reports current alcohol use. She reports that she does not use drugs. She is on disability.   Past Medical History:  Diagnosis Date   Depression    Epilepsy ([place])    Headache    Hyperlipidemia    Hypothyroidism    Short-term memory loss    Caused by seizures   Thyroid cancer [facility])     Past Surgical History:  Procedure Laterality Date   BRAIN SURGERY  05/03/2006   IMPLANTATION VAGAL NERVE STIMULATOR  09/28/2015   IR 3D INDEPENDENT WKST  07/03/2021   IR ANGIO INTRA EXTRACRAN SEL INTERNAL CAROTID BILAT MOD SED   07/03/2021   IR ANGIO VERTEBRAL SEL VERTEBRAL UNI R MOD SED  07/03/2021   IR CT HEAD LTD  07/03/2021   IR RADIOLOGIST EVAL & MGMT  06/12/2021   IR RADIOLOGIST EVAL & MGMT  07/25/2021   IR TRANSCATH/EMBOLIZ  07/03/2021   RADIOLOGY WITH ANESTHESIA N/A 07/03/2021   Procedure: Cerebral angiogram with intent to embolize brain aneurysm;  Surgeon: Luanne Bras, MD;  Location: [place];  Service: Radiology;  Laterality: N/A;   THYROID SURGERY     cancer   VAGUS NERVE STIMULATOR INSERTION Left 09/25/2021   Procedure: Vagus Nerve Stimulator  Battery Change;  Surgeon: Consuella Lose, MD;  Location: [place];  Service: Neurosurgery;  Laterality: Left;    Current Outpatient Medications  Medication Sig Dispense Refill   Ascorbic Acid (VITAMIN C) 1000 MG tablet Take 1,000 mg by mouth daily.     aspirin EC 81 MG tablet Take 81 mg by mouth daily. Swallow whole.     b complex vitamins capsule Take 1 capsule by mouth daily.     Biotin 1000 MCG tablet Take 1,000 mcg by mouth daily.     Calcium Carb-Cholecalciferol (CALCIUM 600/VITAMIN D PO) Take 1 tablet by mouth daily.  cholecalciferol (VITAMIN D3) 25 MCG (1000 UNIT) tablet Take 1,000 Units by mouth daily.     ibuprofen (ADVIL) 200 MG tablet Take 400 mg by mouth every 6 (six) hours as needed for headache.     levothyroxine (SYNTHROID) 50 MCG tablet Take 1 tablet (50 mcg total) by mouth daily. 90 tablet 3   LORazepam (ATIVAN) 2 MG tablet Take 2 mg by mouth daily as needed for seizure.     medroxyPROGESTERone Acetate 150 MG/ML SUSY ADMINISTER 1 INJECTION IN THE MUSCLE EVERY 12 WEEKS AS DIRECTED 1 mL 0   Melatonin 10 MG TABS Take 10 mg by mouth at bedtime.     Multiple Vitamins-Minerals (MULTIVITAMIN WITH MINERALS) tablet Take 1 tablet by mouth daily.     NON FORMULARY Place 90 mg under the tongue in the morning and at bedtime. CBD oil     Omega-3 Fatty Acids (FISH OIL) 1200 MG CPDR Take 1,200 mg by mouth daily.     topiramate (TOPAMAX) 100 MG tablet Take 1 tablet  twice a day (Patient taking differently: Take 150 mg by mouth 2 (two) times daily. Take 1 tablet twice a day) 180 tablet 3   vitamin B-12 (CYANOCOBALAMIN) 1000 MCG tablet Take 1,000 mcg by mouth daily.     No current facility-administered medications for this visit.    Family History  Problem Relation Age of Onset   Breast cancer Mother    Cancer Mother   In her 61's.   Review of Systems  All other systems reviewed and are negative.   Exam:   BP 110/76   Pulse 86   Ht 5' 2.5" (1.588 m)   Wt 204 lb (92.5 kg)   SpO2 98%   BMI 36.72 kg/m   Weight change: '@WEIGHTCHANGE'$ @ Height:   Height: 5' 2.5" (158.8 cm)  Ht Readings from Last 3 Encounters:  06/22/22 5' 2.5" (1.588 m)  06/05/22 '5\' 3"'$  (1.6 m)  05/25/22 '5\' 3"'$  (1.6 m)    General appearance: alert, cooperative and appears stated age Head: Normocephalic, without obvious abnormality, atraumatic Neck: no adenopathy, supple, symmetrical, trachea midline and thyroid normal to inspection and palpation Lungs: clear to auscultation bilaterally Cardiovascular: regular rate and rhythm Breasts: normal appearance, no masses or tenderness Abdomen: soft, non-tender; non distended,  no masses,  no organomegaly Extremities: extremities normal, atraumatic, no cyanosis or edema Skin: Skin color, texture, turgor normal. No rashes or lesions Lymph nodes: Cervical, supraclavicular, and axillary nodes normal. No abnormal inguinal nodes palpated Neurologic: Grossly normal   Pelvic: External genitalia:  no lesions              Urethra:  normal appearing urethra with no masses, tenderness or lesions              Bartholins and Skenes: normal                 Vagina: normal appearing vagina with normal color and discharge, no lesions              Cervix: no lesions               Bimanual Exam:  Uterus:   no masses or tenderness              Adnexa: no mass, fullness, tenderness               Rectovaginal: Confirms               Anus:  normal  sphincter tone,  no lesions  Gae Dry, CMA chaperoned for the exam.  1. Well woman exam Discussed breast self exam Discussed calcium and vit D intake Pap UTD Labs with primary  2. Seizure ([place]) Her seizures are worsened with her cycle, this has been well controlled with depo-provera. She was previously advised to not stay on the depo-provera long term. I gave her information from UTD , ACOG, WHO, CDC all agree that the benefits of long term depo-provera can out weigh the risks. "The available evidence does not justify limiting the duration of DMPA use, which may safely be continued for decades"   3. Cerebral aneurysm S/P repair  4. General counseling and advice on female contraception We discussed continuing the depo-provera. See above  5. Vaginal burning - WET PREP FOR TRICH, YEAST, CLUE: negative  6. Dysuria - Urinalysis, Complete  7. Vaginal atrophy Attempted to send Imvexxy 4 mcg tablets, very expensive.  - Estradiol 10 MCG TABS vaginal tablet; Place 1 tablet (10 mcg total) vaginally 2 (two) times a week.  Dispense: 24 tablet; Refill: 0 -f/u in 3 months

## 2022-07-12 ENCOUNTER — Other Ambulatory Visit (HOSPITAL_COMMUNITY): Payer: Self-pay | Admitting: Interventional Radiology

## 2022-07-12 DIAGNOSIS — I671 Cerebral aneurysm, nonruptured: Secondary | ICD-10-CM

## 2022-07-16 ENCOUNTER — Telehealth: Payer: Self-pay | Admitting: Internal Medicine

## 2022-07-16 DIAGNOSIS — C73 Malignant neoplasm of thyroid gland: Secondary | ICD-10-CM

## 2022-07-16 NOTE — Telephone Encounter (Signed)
Pt called requesting they need another referral sent that's in network through [place] for ENT.

## 2022-07-17 NOTE — Telephone Encounter (Signed)
I don't see reason for referral I cannot do anything without this.

## 2022-07-17 NOTE — Telephone Encounter (Signed)
[facility] does not have an ENT directly but we would could connect her with someone in her insurance network, we do still need a reason for a referral

## 2022-07-18 NOTE — Telephone Encounter (Signed)
Spoke w/ pt/mom the reason she was seeing the ENT was for her thyroid cancer. She states the nodule not grown she was keeping eye on it.Marland KitchenJohny Chess

## 2022-07-19 NOTE — Telephone Encounter (Signed)
Informed patient of her referral

## 2022-07-19 NOTE — Telephone Encounter (Signed)
Referral done

## 2022-07-23 ENCOUNTER — Telehealth: Payer: Self-pay | Admitting: Anesthesiology

## 2022-07-23 NOTE — Telephone Encounter (Signed)
Pt called no answer left a voice mail to call the office back  °

## 2022-07-23 NOTE — Telephone Encounter (Signed)
Pls check with patient/mom if the seizures quieted down on the '150mg'$  BID dose? She had extra '50mg'$  tabs and was taking 100+'50mg'$  BID.

## 2022-07-23 NOTE — Telephone Encounter (Signed)
Pt called stating she needs a refill on her medications  1. Which medications need refilled? Topamax 50 mg  2. Which pharmacy/location is medication to be sent to? [facility] on [address]   3. Do they need a 30 day or 90 day supply? 30 day supply      1. Which medications need refilled? Topamax 100 mg   2. Which pharmacy/location is medication to be sent to? [facility] on [address]  3. Do they need a 30 day or 90 day supply? 30 day supply

## 2022-07-23 NOTE — Telephone Encounter (Signed)
Pt called in she stated that she is doing good being on the topamax '150mg'$  BID she said she has about 5 days left of medication ,

## 2022-07-24 MED ORDER — TOPIRAMATE 100 MG PO TABS: ORAL_TABLET | ORAL | 6 refills | Status: DC

## 2022-07-24 MED ORDER — TOPIRAMATE 50 MG PO TABS: ORAL_TABLET | ORAL | 6 refills | Status: DC

## 2022-07-24 NOTE — Telephone Encounter (Signed)
Pt called informed we sent RX in for her

## 2022-07-24 NOTE — Telephone Encounter (Signed)
Rx sent for both Topiramate '100mg'$  tablets and '50mg'$  tablets: to take '150mg'$  twice a day. Thanks

## 2022-07-25 ENCOUNTER — Encounter (HOSPITAL_COMMUNITY): Payer: Self-pay

## 2022-07-25 ENCOUNTER — Ambulatory Visit (HOSPITAL_COMMUNITY)
Admission: RE | Admit: 2022-07-25 | Discharge: 2022-07-25 | Disposition: A | Discharge status: Home / Self Care | Payer: Medicare Other | Source: Ambulatory Visit | Attending: Interventional Radiology | Admitting: Interventional Radiology

## 2022-07-25 DIAGNOSIS — I671 Cerebral aneurysm, nonruptured: Secondary | ICD-10-CM | POA: Insufficient documentation

## 2022-07-25 MED ORDER — SODIUM CHLORIDE (PF) 0.9 % IJ SOLN
INTRAMUSCULAR | Status: AC
  Filled 2022-07-25: qty 50

## 2022-07-25 MED ORDER — IOHEXOL 350 MG/ML SOLN
75.0000 mL | Freq: Once | INTRAVENOUS | Status: AC | PRN
  Administered 2022-07-25: 75 mL via INTRAVENOUS

## 2022-07-31 ENCOUNTER — Telehealth (HOSPITAL_COMMUNITY): Payer: Self-pay

## 2022-07-31 NOTE — Telephone Encounter (Signed)
Called pt to let her know that Dr. Estanislado Pandy would like to have her f/u in 6 months with another cta. She wants to know if she can do 1 year instead. I will reach out  to our PA and get back to her. AB

## 2022-08-16 ENCOUNTER — Ambulatory Visit (INDEPENDENT_AMBULATORY_CARE_PROVIDER_SITE_OTHER): Payer: Medicare Other | Admitting: Neurology

## 2022-08-16 VITALS — BP 112/84 | HR 90 | Ht 63.0 in | Wt 205.8 lb

## 2022-08-16 DIAGNOSIS — G40219 Localization-related (focal) (partial) symptomatic epilepsy and epileptic syndromes with complex partial seizures, intractable, without status epilepticus: Secondary | ICD-10-CM | POA: Diagnosis not present

## 2022-08-16 NOTE — Patient Instructions (Signed)
Always a pleasure seeing you. Continue all your medications. Follow-up in 3-4 months, call for any changes.    Seizure Precautions: 1. If medication has been prescribed for you to prevent seizures, take it exactly as directed.  Do not stop taking the medicine without talking to your doctor first, even if you have not had a seizure in a long time.   2. Avoid activities in which a seizure would cause danger to yourself or to others.  Don't operate dangerous machinery, swim alone, or climb in high or dangerous places, such as on ladders, roofs, or girders.  Do not drive unless your doctor says you may.  3. If you have any warning that you may have a seizure, lay down in a safe place where you can't hurt yourself.    4.  No driving for 6 months from last seizure, as per [facility].   Please refer to the following link on the [place] website for more information: http://www.epilepsyfoundation.org/answerplace/Social/driving/drivingu.cfm   5.  Maintain good sleep hygiene. Avoid alcohol.  6.  Notify your neurology if you are planning pregnancy or if you become pregnant.  7.  Contact your doctor if you have any problems that may be related to the medicine you are taking.  8.  Call 911 and bring the patient back to the ED if:        A.  The seizure lasts longer than 5 minutes.       B.  The patient doesn't awaken shortly after the seizure  C.  The patient has new problems such as difficulty seeing, speaking or moving  D.  The patient was injured during the seizure  E.  The patient has a temperature over 102 F (39C)  F.  The patient vomited and now is having trouble breathing

## 2022-08-16 NOTE — Progress Notes (Signed)
NEUROLOGY FOLLOW UP OFFICE NOTE  [name] 05-13-1986  HISTORY OF PRESENT ILLNESS: I had the pleasure of seeing Sandra Collins in follow-up in the neurology clinic on 08/16/2022.  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 She is again accompanied by her mother who helps supplement the history today.  Records and images were personally reviewed where available. On her last visit, they reported difficulty in controlling her hormones, leading to an increase in temperature, triggering her seizures. She had previously been seizure-free for a year until an increase in seizures since November 2023. Topiramate increased back to 150mg  BID. They report an improvement in seizures, but also after seeing her gynecologist, she now has estrogen vaginal twice a week. They have noticed that the day after use, her temperature goes up to 99, she would take Ibuprofen and use ice packs to keep it down. She may have an aura but has not had much seizures. She did have one last week but also had on too much blankets. She denies any headaches, dizziness, vision changes. Over the past year, she has noticed more word-finding difficulties, difficulty pronouncing a word. She has always been really sharp so it concerns her. She has had a few falls/slips, no injuries. She will be starting her gym membership next week at [employer] and looks forward to it. She has a new boyfriend Sandra Collins who lives close by.    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 Records from Dr. Amalia Hailey and provided by her mother were reviewed. Seizures started at 3 months of age (10/1986). She was started on Depakote in 07/1989. She underwent EMU monitoring in [place] in 2007 and had left temporal lobe surgery in  [place], [place] that was unsuccessful. VNS was placed in 2017. Main trigger noted for her seizures is a temperature of 99 degrees, whether from illness, physical activity, stress, or menstrual period. She started a CBD schedule in 2020 with her neurologist in [place] and they were able to wean off Lamotrigine in 2021. She continues on Topiramate 200mg  BID and CBD 70mg  BID. CBD is obtained from [employer] in [place].  Seizure semiology has included staring with bilateral hand automatisms followed by generalized tonic posturing, and drop attacks. She will have an aura of grabbing on to something then has loss of consciousness. Her mother notes shakiness and pupils dilate. On her last visit with Dr. Amalia Hailey in 08/2020, they reported her last seizure with loss of consciousness/GTC was in 12/2019, however today report that she had 4 seizures last year with spacing out or behavioral arrest for 15 seconds. She had a seizure yesterday in her sleep occurring on and off from 2:30am to 6am. She almost fell off the bed. She was biting her pillows and teddy bear trying to stay calm. She could feel her body shaking a little bit for 15-30 seconds, occurring repeatedly and waking her up. She did have a low grade temperature and took Ibuprofen and 2mg  lorazepam, with no further events for the rest of the day. She denies any loss of consciousness with the recent seizures. She has rare body jerks. She denies any olfactory/gustatory hallucinations, focal numbness/tingling/weakness. She has rare migraines and takes Ibuprofen with good response. She has some swallowing difficulties due to her thyroid issues, and back pain due to scoliosis.  No bowel/bladder dysfunction. Sleep is good. Mood is alright. She manages her own medications without issues.She lives with her mother and seizure dog. She does not drive.   Epilepsy Risk Factors:  A paternal aunt had epilepsy in childhood, 2 paternal cousins have epilepsy. She had a febrile  convulsion in childhood. Otherwise she had a normal birth with no history of CNS infections such as meningitis/encephalitis, significant traumatic brain injury, neurosurgical procedures  Prior ASMs: Lamotrigine, Sabril, Dilantin, Trileptal, Lyrica, Onfi, Depakote, Zarontin, Felbatol, Neurontin, Tegretol, Phenobarbital   PAST MEDICAL HISTORY: Past Medical History:  Diagnosis Date   Depression    Epilepsy ([place])    Headache    Hyperlipidemia    Hypothyroidism    Short-term memory loss    Caused by seizures   Thyroid cancer ([place])     MEDICATIONS: Current Outpatient Medications on File Prior to Visit  Medication Sig Dispense Refill   Ascorbic Acid (VITAMIN C) 1000 MG tablet Take 1,000 mg by mouth daily.     aspirin EC 81 MG tablet Take 81 mg by mouth daily. Swallow whole.     b complex vitamins capsule Take 1 capsule by mouth daily.     Biotin 1000 MCG tablet Take 1,000 mcg by mouth daily.     Calcium-Magnesium-Vitamin D (CALCIUM 1200+D3 PO) Take 1 tablet by mouth 2 (two) times daily.     cholecalciferol (VITAMIN D3) 25 MCG (1000 UNIT) tablet Take 1,000 Units by mouth daily.     Estradiol 10 MCG TABS vaginal tablet Place 1 tablet (10 mcg total) vaginally 2 (two) times a week. 24 tablet 0   ibuprofen (ADVIL) 200 MG tablet Take 400 mg by mouth every 6 (six) hours as needed for headache.     levothyroxine (SYNTHROID) 50 MCG tablet Take 1 tablet (50 mcg total) by mouth daily. 90 tablet 3   LORazepam (ATIVAN) 2 MG tablet Take 2 mg by mouth daily as needed for seizure.     medroxyPROGESTERone Acetate 150 MG/ML SUSY ADMINISTER 1 INJECTION IN THE MUSCLE EVERY 12 WEEKS AS DIRECTED 1 mL 0   Melatonin 10 MG TABS Take 10 mg by mouth at bedtime.     Multiple Vitamins-Minerals (MULTIVITAMIN WITH MINERALS) tablet Take 1 tablet by mouth daily.     NON FORMULARY Place 90 mg under the tongue in the morning and at bedtime. CBD oil     Omega-3 Fatty Acids (FISH OIL) 1200 MG CPDR Take 1,200 mg by mouth  daily.     topiramate (TOPAMAX) 100 MG tablet Take 1 tablet twice a day (take with 50mg  tablet for total of 150mg  twice a day) 60 tablet 6   topiramate (TOPAMAX) 50 MG tablet Take 1 tablet twice a day (take with 100mg  tablet for total of 150mg  twice a day) 60 tablet 6   vitamin B-12 (CYANOCOBALAMIN) 1000 MCG tablet Take 1,000 mcg by mouth daily.     No current facility-administered medications on file prior to visit.    ALLERGIES: Allergies  Allergen Reactions   Estrogens Other (See Comments)    Seizures   Tape Hives, Itching, Swelling and Rash    Must use paper tape.    Amoxicillin Hives   Latex Hives   Metronidazole Hives   Pregabalin Other (See Comments)    Excessive weight gain      FAMILY HISTORY: Family History  Problem Relation Age of Onset   Breast cancer Mother    Cancer Mother     SOCIAL HISTORY: Social History  Socioeconomic History   Marital status: Single    Spouse name: Not on file   Number of children: 0   Years of education: 22   Highest education level: 12th grade  Occupational History   Not on file  Tobacco Use   Smoking status: Never   Smokeless tobacco: Never  Vaping Use   Vaping Use: Never used  Substance and Sexual Activity   Alcohol use: Yes    Comment: rare   Drug use: Never   Sexual activity: Not Currently    Birth control/protection: Injection  Other Topics Concern   Not on file  Social History Narrative   Right handed   Lives with mother in a one story townhome   Caffeine one a day   2 dogs   Social Determinants of Health   Financial Resource Strain: Low Risk  (08/16/2022)   Overall Financial Resource Strain (CARDIA)    Difficulty of Paying Living Expenses: Not very hard  Food Insecurity: No Food Insecurity (08/16/2022)   Hunger Vital Sign    Worried About Running Out of Food in the Last Year: Never true    Ran Out of Food in the Last Year: Never true  Transportation Needs: No Transportation Needs (08/16/2022)   PRAPARE -  Hydrologist (Medical): No    Lack of Transportation (Non-Medical): No  Physical Activity: Inactive (08/16/2022)   Exercise Vital Sign    Days of Exercise per Week: 0 days    Minutes of Exercise per Session: 30 min  Stress: No Stress Concern Present (08/16/2022)   [place]    Feeling of Stress : Only a little  Social Connections: Moderately Integrated (08/16/2022)   Social Connection and Isolation Panel [NHANES]    Frequency of Communication with Friends and Family: More than three times a week    Frequency of Social Gatherings with Friends and Family: Once a week    Attends Religious Services: More than 4 times per year    Active Member of [employer] or Organizations: Yes    Attends Music therapist: More than 4 times per year    Marital Status: Never married  Intimate Partner Violence: Not At Risk (03/22/2022)   Humiliation, Afraid, Rape, and Kick questionnaire    Fear of Current or Ex-Partner: No    Emotionally Abused: No    Physically Abused: No    Sexually Abused: No     PHYSICAL EXAM: Vitals:   08/16/22 1146  BP: 112/84  Pulse: 90  SpO2: 99%   General: No acute distress, anxious at times Head:  Normocephalic/atraumatic Skin/Extremities: No rash, no edema Neurological Exam: alert and awake. No aphasia or dysarthria. Fund of knowledge is appropriate.  Attention and concentration are normal.   Cranial nerves: Pupils equal, round. Extraocular movements intact with no nystagmus. Visual fields full.  No facial asymmetry.  Motor: Bulk and tone normal, muscle strength 5/5 throughout with no pronator drift.   Finger to nose testing intact.  Gait narrow-based and steady, no ataxia. +sway with Romberg test.   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 MRI brain did not show any significant intracranial  abnormalities. 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 She had an increase in seizures as she noticed difficulty with regulating body temperature, she is currently working with her gynecologist on hormone therapy. She reports a  reduction in seizures, continue Topiramate 150mg  BID and CBD 70mg  BID obtained from [facility] in [place]. She does not drive. Follow-up in 3-4 months, call for any changes.    Thank you for allowing me to participate in her care.  Please do not hesitate to call for any questions or concerns.    Ellouise Newer, M.D.   CC: Dr. Sharlet Salina

## 2022-08-20 ENCOUNTER — Encounter: Payer: Self-pay | Admitting: Internal Medicine

## 2022-08-20 ENCOUNTER — Telehealth: Payer: Self-pay

## 2022-08-20 ENCOUNTER — Ambulatory Visit (INDEPENDENT_AMBULATORY_CARE_PROVIDER_SITE_OTHER): Payer: Medicare Other | Admitting: Internal Medicine

## 2022-08-20 VITALS — BP 112/80 | HR 93 | Temp 98.7°F | Ht 63.0 in | Wt 203.0 lb

## 2022-08-20 DIAGNOSIS — M419 Scoliosis, unspecified: Secondary | ICD-10-CM | POA: Diagnosis not present

## 2022-08-20 DIAGNOSIS — R569 Unspecified convulsions: Secondary | ICD-10-CM | POA: Diagnosis not present

## 2022-08-20 DIAGNOSIS — R1310 Dysphagia, unspecified: Secondary | ICD-10-CM | POA: Diagnosis not present

## 2022-08-20 DIAGNOSIS — C73 Malignant neoplasm of thyroid gland: Secondary | ICD-10-CM

## 2022-08-20 NOTE — Assessment & Plan Note (Signed)
Starting exercise routine today and needs note for gym that she is able to exercise without a back brace. I agree that she would benefit from muscle training and does not need back brace.

## 2022-08-20 NOTE — Assessment & Plan Note (Signed)
Some increase in fullness of the thyroid and ordered US thyroid to assess.

## 2022-08-20 NOTE — Progress Notes (Signed)
   Subjective:   Patient ID: Sandra Collins, female    DOB: 31-May-1985, 37 y.o.   MRN: [id number]  HPI The patient is a 37 YO female coming in for follow up. Some new dysphagia with melatonin gummies. Some fullness to the thyroid area.  Review of Systems  Constitutional: Negative.   HENT:  Positive for trouble swallowing.   Eyes: Negative.   Respiratory:  Negative for cough, chest tightness and shortness of breath.   Cardiovascular:  Negative for chest pain, palpitations and leg swelling.  Gastrointestinal:  Negative for abdominal distention, abdominal pain, constipation, diarrhea, nausea and vomiting.  Musculoskeletal: Negative.   Skin: Negative.   Neurological:  Positive for seizures.  Psychiatric/Behavioral: Negative.      Objective:  Physical Exam Constitutional:      Appearance: She is well-developed.  HENT:     Head: Normocephalic and atraumatic.  Neck:     Comments: Some fullness to the thyroid region, no discrete masses Cardiovascular:     Rate and Rhythm: Normal rate and regular rhythm.  Pulmonary:     Effort: Pulmonary effort is normal. No respiratory distress.     Breath sounds: Normal breath sounds. No wheezing or rales.  Abdominal:     General: Bowel sounds are normal. There is no distension.     Palpations: Abdomen is soft.     Tenderness: There is no abdominal tenderness. There is no rebound.  Musculoskeletal:     Cervical back: Normal range of motion.  Skin:    General: Skin is warm and dry.  Neurological:     Mental Status: She is alert and oriented to person, place, and time.     Coordination: Coordination normal.     Vitals:   08/20/22 0947  BP: 112/80  Pulse: 93  Temp: 98.7 F (37.1 C)  TempSrc: Oral  SpO2: 99%  Weight: 203 lb (92.1 kg)  Height: 5\' 3"  (1.6 m)    Assessment & Plan:

## 2022-08-20 NOTE — Assessment & Plan Note (Signed)
Overall stable and they are working with ob/gyn to stabilize hormones to help reduce temp change and trigger pre-seizure activity.

## 2022-08-20 NOTE — Assessment & Plan Note (Signed)
Given hx thyroid cancer will order US thyroid to assess the new fullness and difficulty with swallowing melatonin gummies. She is having some allergies and that may be related. Reviewed recent CT angio neck/head and no change in thyroid noted specifically but this change is within 2-3 weeks.

## 2022-08-20 NOTE — Telephone Encounter (Signed)
Letter has been done 

## 2022-08-20 NOTE — Patient Instructions (Signed)
We will check the ultrasound of the thyroid to check,  You can use hydrocortisone a q-tip in the ear 2-3 times per week to help with the itching.

## 2022-08-22 ENCOUNTER — Ambulatory Visit
Admission: RE | Admit: 2022-08-22 | Discharge: 2022-08-22 | Disposition: A | Discharge status: Home / Self Care | Payer: Medicare Other | Source: Ambulatory Visit | Attending: Internal Medicine | Admitting: Internal Medicine

## 2022-08-22 DIAGNOSIS — R1310 Dysphagia, unspecified: Secondary | ICD-10-CM

## 2022-08-29 ENCOUNTER — Ambulatory Visit: Payer: Medicare Other | Admitting: Neurology

## 2022-09-21 NOTE — Progress Notes (Unsigned)
GYNECOLOGY  VISIT   HPI: 37 y.o.   Single White or Caucasian Not Hispanic or Latino  female   [id number] with No LMP recorded. Patient has had an injection.   here for   3 mo f/u  GYNECOLOGIC HISTORY: No LMP recorded. Patient has had an injection. 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   Brain aneurysm 07/03/2021   Scoliosis 05/02/2021   Cerebral aneurysm 05/02/2021   Obesity 05/02/2021   Family history of breast cancer 05/02/2021   Anxiety about health 04/26/2021   Elevated LDL cholesterol level 09/06/2020   Hypothyroidism 09/06/2020   Seizure (HCC) 09/06/2020   Papillary thyroid carcinoma (HCC) 02/23/2020   Vitamin D deficiency 02/23/2020    Past Medical History:  Diagnosis Date   Depression    Epilepsy (HCC)    Headache    Hyperlipidemia    Hypothyroidism    Short-term memory loss    Caused by seizures   Thyroid cancer (HCC)     Past Surgical History:  Procedure Laterality Date   BRAIN SURGERY  05/03/2006   IMPLANTATION VAGAL NERVE STIMULATOR  09/28/2015   IR 3D INDEPENDENT WKST  07/03/2021   IR ANGIO INTRA EXTRACRAN SEL INTERNAL CAROTID BILAT MOD SED  07/03/2021   IR ANGIO VERTEBRAL SEL VERTEBRAL UNI R MOD SED  07/03/2021   IR CT HEAD LTD  07/03/2021   IR RADIOLOGIST EVAL & MGMT  06/12/2021   IR RADIOLOGIST EVAL & MGMT  07/25/2021   IR TRANSCATH/EMBOLIZ  07/03/2021   RADIOLOGY WITH ANESTHESIA N/A 07/03/2021   Procedure: Cerebral angiogram with intent to embolize brain aneurysm;  Surgeon: Julieanne Cotton, MD;   Location: [facility] OR;  Service: Radiology;  Laterality: N/A;   THYROID SURGERY     cancer   VAGUS NERVE STIMULATOR INSERTION Left 09/25/2021   Procedure: Vagus Nerve Stimulator  Battery Change;  Surgeon: Lisbeth Renshaw, MD;  Location: [facility] OR;  Service: Neurosurgery;  Laterality: Left;    Current Outpatient Medications  Medication Sig Dispense Refill   Ascorbic Acid (VITAMIN C) 1000 MG tablet Take 1,000 mg by mouth daily.     aspirin EC 81 MG tablet Take 81 mg by mouth daily. Swallow whole.     b complex vitamins capsule Take 1 capsule by mouth daily.     Biotin 1000 MCG tablet Take 1,000 mcg by mouth daily.     Calcium-Magnesium-Vitamin D (CALCIUM 1200+D3 PO) Take 1 tablet by mouth 2 (two) times daily.     cholecalciferol (VITAMIN D3) 25 MCG (1000 UNIT) tablet Take 1,000 Units by mouth daily.     Estradiol 10 MCG TABS vaginal tablet Place 1 tablet (10 mcg total) vaginally 2 (two) times a week. 24 tablet 0   ibuprofen (ADVIL) 200 MG tablet Take 400 mg by mouth every  6 (six) hours as needed for headache.     levothyroxine (SYNTHROID) 50 MCG tablet Take 1 tablet (50 mcg total) by mouth daily. 90 tablet 3   LORazepam (ATIVAN) 2 MG tablet Take 2 mg by mouth daily as needed for seizure.     medroxyPROGESTERone Acetate 150 MG/ML SUSY ADMINISTER 1 INJECTION IN THE MUSCLE EVERY 12 WEEKS AS DIRECTED 1 mL 0   Melatonin 10 MG TABS Take 10 mg by mouth at bedtime.     Multiple Vitamins-Minerals (MULTIVITAMIN WITH MINERALS) tablet Take 1 tablet by mouth daily.     NON FORMULARY Place 90 mg under the tongue in the morning and at bedtime. CBD oil     Omega-3 Fatty Acids (FISH OIL) 1200 MG CPDR Take 1,200 mg by mouth daily.     topiramate (TOPAMAX) 100 MG tablet Take 1 tablet twice a day (take with 50mg  tablet for total of 150mg  twice a day) 60 tablet 6   topiramate (TOPAMAX) 50 MG tablet Take 1 tablet twice a day (take with 100mg  tablet for total of 150mg  twice a day) 60 tablet 6   vitamin B-12 (CYANOCOBALAMIN)  1000 MCG tablet Take 1,000 mcg by mouth daily.     No current facility-administered medications for this visit.     ALLERGIES: Estrogens, Tape, Amoxicillin, Latex, Metronidazole, and Pregabalin  Family History  Problem Relation Age of Onset   Breast cancer Mother    Cancer Mother     Social History   Socioeconomic History   Marital status: Single    Spouse name: Not on file   Number of children: 0   Years of education: 64   Highest education level: 12th grade  Occupational History   Not on file  Tobacco Use   Smoking status: Never   Smokeless tobacco: Never  Vaping Use   Vaping Use: Never used  Substance and Sexual Activity   Alcohol use: Yes    Comment: rare   Drug use: Never   Sexual activity: Not Currently    Birth control/protection: Injection  Other Topics Concern   Not on file  Social History Narrative   Right handed   Lives with mother in a one story townhome   Caffeine one a day   2 dogs   Social Determinants of Health   Financial Resource Strain: Low Risk  (08/16/2022)   Overall Financial Resource Strain (CARDIA)    Difficulty of Paying Living Expenses: Not very hard  Food Insecurity: No Food Insecurity (08/16/2022)   Hunger Vital Sign    Worried About Running Out of Food in the Last Year: Never true    Ran Out of Food in the Last Year: Never true  Transportation Needs: No Transportation Needs (08/16/2022)   PRAPARE - Administrator, Civil Service (Medical): No    Lack of Transportation (Non-Medical): No  Physical Activity: Inactive (08/16/2022)   Exercise Vital Sign    Days of Exercise per Week: 0 days    Minutes of Exercise per Session: 30 min  Stress: No Stress Concern Present (08/16/2022)   [employer] of Occupational Health - Occupational Stress Questionnaire    Feeling of Stress : Only a little  Social Connections: Moderately Integrated (08/16/2022)   Social Connection and Isolation Panel [NHANES]    Frequency of Communication  with Friends and Family: More than three times a week    Frequency of Social Gatherings with Friends and Family: Once a week    Attends Religious Services: More  than 4 times per year    Active Member of Clubs or Organizations: Yes    Attends Banker Meetings: More than 4 times per year    Marital Status: Never married  Intimate Partner Violence: Not At Risk (03/22/2022)   Humiliation, Afraid, Rape, and Kick questionnaire    Fear of Current or Ex-Partner: No    Emotionally Abused: No    Physically Abused: No    Sexually Abused: No    ROS  PHYSICAL EXAMINATION:    There were no vitals taken for this visit.    General appearance: alert, cooperative and appears stated age Neck: no adenopathy, supple, symmetrical, trachea midline and thyroid {CHL AMB PHY EX THYROID NORM [phone]::"normal to inspection and palpation"} Breasts: {Exam; breast:13139::"normal appearance, no masses or tenderness"} Abdomen: soft, non-tender; non distended, no masses,  no organomegaly  Pelvic: External genitalia:  no lesions              Urethra:  normal appearing urethra with no masses, tenderness or lesions              Bartholins and Skenes: normal                 Vagina: normal appearing vagina with normal color and discharge, no lesions              Cervix: {CHL AMB PHY EX CERVIX NORM DEFAULT:[phone]::"no lesions"}              Bimanual Exam:  Uterus:  {CHL AMB PHY EX UTERUS NORM DEFAULT:[phone]::"normal size, contour, position, consistency, mobility, non-tender"}              Adnexa: {CHL AMB PHY EX ADNEXA NO MASS DEFAULT:[phone]::"no mass, fullness, tenderness"}              Rectovaginal: {yes no:[id number]}.  Confirms.              Anus:  normal sphincter tone, no lesions  Chaperone was present for exam.  ASSESSMENT     PLAN    An After Visit Summary was printed and given to the patient.  *** minutes face to face time of which over 50% was spent in counseling.

## 2022-09-27 ENCOUNTER — Ambulatory Visit (INDEPENDENT_AMBULATORY_CARE_PROVIDER_SITE_OTHER): Payer: Medicare Other | Admitting: Obstetrics and Gynecology

## 2022-09-27 ENCOUNTER — Encounter: Payer: Self-pay | Admitting: Obstetrics and Gynecology

## 2022-09-27 VITALS — BP 122/84 | HR 97 | Ht 62.5 in | Wt 200.0 lb

## 2022-09-27 DIAGNOSIS — Z113 Encounter for screening for infections with a predominantly sexual mode of transmission: Secondary | ICD-10-CM

## 2022-09-27 DIAGNOSIS — N952 Postmenopausal atrophic vaginitis: Secondary | ICD-10-CM | POA: Diagnosis not present

## 2022-09-27 MED ORDER — ESTRADIOL 10 MCG VA TABS: 1.0000 | ORAL_TABLET | VAGINAL | 2 refills | Status: DC

## 2022-09-28 LAB — SURESWAB CT/NG/T. VAGINALIS
C. trachomatis RNA, TMA: NOT DETECTED
N. gonorrhoeae RNA, TMA: NOT DETECTED
Trichomonas vaginalis RNA: NOT DETECTED

## 2022-10-04 ENCOUNTER — Other Ambulatory Visit: Payer: Self-pay | Admitting: Internal Medicine

## 2022-10-04 NOTE — Telephone Encounter (Signed)
Patient is calling about this rx

## 2022-10-08 NOTE — Telephone Encounter (Signed)
Patient is still calling about the rx.  Please advise  Patient's number:  [phone]

## 2022-11-12 ENCOUNTER — Ambulatory Visit (INDEPENDENT_AMBULATORY_CARE_PROVIDER_SITE_OTHER): Payer: Medicare Other | Admitting: Internal Medicine

## 2022-11-12 ENCOUNTER — Encounter: Payer: Self-pay | Admitting: Internal Medicine

## 2022-11-12 VITALS — BP 112/80 | HR 111 | Temp 98.5°F | Ht 62.5 in | Wt 202.0 lb

## 2022-11-12 DIAGNOSIS — J329 Chronic sinusitis, unspecified: Secondary | ICD-10-CM | POA: Insufficient documentation

## 2022-11-12 DIAGNOSIS — J0110 Acute frontal sinusitis, unspecified: Secondary | ICD-10-CM | POA: Diagnosis not present

## 2022-11-12 MED ORDER — AZITHROMYCIN 250 MG PO TABS: ORAL_TABLET | ORAL | 0 refills | Status: AC

## 2022-11-12 MED ORDER — MEDROXYPROGESTERONE ACETATE 150 MG/ML IM SUSY: 1.0000 mL | PREFILLED_SYRINGE | INTRAMUSCULAR | 3 refills | Status: DC

## 2022-11-12 MED ORDER — LIDOCAINE VISCOUS HCL 2 % MT SOLN: 10.0000 mL | OROMUCOSAL | 0 refills | Status: DC | PRN

## 2022-11-12 NOTE — Progress Notes (Signed)
   Subjective:   Patient ID: Sandra Collins, female    DOB: 05/17/1986, 37 y.o.   MRN: [id number]  HPI The patient is a 37 YO female coming in for sinus issues and sore throat for 2 weeks. Overall worsening. Cough with green/yellow sputum and some blood.  Review of Systems  Constitutional:  Positive for activity change and appetite change. Negative for chills, fatigue, fever and unexpected weight change.  HENT:  Positive for congestion, rhinorrhea, sinus pressure, sore throat, trouble swallowing and voice change. Negative for ear discharge, ear pain, postnasal drip, sinus pain, sneezing and tinnitus.   Eyes: Negative.   Respiratory:  Positive for cough. Negative for chest tightness, shortness of breath and wheezing.   Cardiovascular: Negative.   Gastrointestinal: Negative.   Musculoskeletal:  Positive for myalgias.  Neurological: Negative.     Objective:  Physical Exam Constitutional:      Appearance: She is well-developed.  HENT:     Head: Normocephalic and atraumatic.     Comments: Oropharynx with redness and clear drainage, nose with swollen turbinates, TMs normal bilaterally.  Cardiovascular:     Rate and Rhythm: Normal rate and regular rhythm.  Pulmonary:     Effort: Pulmonary effort is normal. No respiratory distress.     Breath sounds: Normal breath sounds. No wheezing or rales.  Abdominal:     Palpations: Abdomen is soft.  Musculoskeletal:        General: Tenderness present.     Cervical back: Normal range of motion.  Lymphadenopathy:     Cervical: Cervical adenopathy present.  Skin:    General: Skin is warm and dry.  Neurological:     Mental Status: She is alert and oriented to person, place, and time.     Vitals:   11/12/22 0958  BP: 112/80  Pulse: (!) 111  Temp: 98.5 F (36.9 C)  TempSrc: Oral  SpO2: 99%  Weight: 202 lb (91.6 kg)  Height: 5' 2.5" (1.588 m)    Assessment & Plan:

## 2022-11-12 NOTE — Patient Instructions (Signed)
We have sent in the z-pack to take 2 pills today. Then starting tomorrow take 1 pill daily until gone.  We have sent in liquid lidocaine to help the pain in the throat.

## 2022-11-12 NOTE — Assessment & Plan Note (Signed)
Rx azithromycin due to pcn allergy. Rx lidocaine viscous for throat pain. She will continue current medications.

## 2022-11-30 ENCOUNTER — Ambulatory Visit
Admission: RE | Admit: 2022-11-30 | Discharge: 2022-11-30 | Disposition: A | Discharge status: Home / Self Care | Payer: Medicare Other | Source: Ambulatory Visit | Attending: Internal Medicine | Admitting: Internal Medicine

## 2022-11-30 DIAGNOSIS — N6489 Other specified disorders of breast: Secondary | ICD-10-CM

## 2022-12-03 ENCOUNTER — Other Ambulatory Visit: Payer: Self-pay | Admitting: Internal Medicine

## 2022-12-03 DIAGNOSIS — N6320 Unspecified lump in the left breast, unspecified quadrant: Secondary | ICD-10-CM

## 2022-12-06 ENCOUNTER — Encounter: Payer: Self-pay | Admitting: Internal Medicine

## 2022-12-06 ENCOUNTER — Ambulatory Visit (INDEPENDENT_AMBULATORY_CARE_PROVIDER_SITE_OTHER): Payer: Medicare Other | Admitting: Internal Medicine

## 2022-12-06 VITALS — BP 120/72 | HR 80 | Ht 62.5 in | Wt 200.0 lb

## 2022-12-06 DIAGNOSIS — R7989 Other specified abnormal findings of blood chemistry: Secondary | ICD-10-CM | POA: Diagnosis not present

## 2022-12-06 DIAGNOSIS — E890 Postprocedural hypothyroidism: Secondary | ICD-10-CM

## 2022-12-06 DIAGNOSIS — Z85850 Personal history of malignant neoplasm of thyroid: Secondary | ICD-10-CM

## 2022-12-06 LAB — TSH: TSH: 1.49 u[IU]/mL (ref 0.35–5.50)

## 2022-12-06 NOTE — Progress Notes (Signed)
Name: Sandra Collins  MRN/ DOB: [id number], 07-09-1985    Age/ Sex: 37 y.o., female    PCP: Myrlene Broker, MD   Reason for Endocrinology Evaluation: PTC     Date of Initial Endocrinology Evaluation: 06/05/2022    HPI: Sandra Collins is a 37 y.o. female with a past medical history of Hx brain aneurysm repair, seizure d/o. The patient presented for initial endocrinology clinic visit on 06/05/2022 for consultative assistance with her PTC.   Patient is s/p right hemithyroidectomy on 12//2018 while living in [place] with PTC, multifocal, no extrathyroidal extension, no angioinvasion, no lymphatic invasion, report stated perineural invasion could not be determined.  Tumor greatest size 1.1 cm.  Thyroid ultrasound prior to surgery demonstrated a right thyroid nodule 1.4 x 1.1 x 0.9 cm with no nodularity on the left  In 2022 and per  review of her records through care everywhere she had comprehensive workup for left cervical adenopathy which included ultrasound, CT neck, FNA with subsequent flow cytometry.  Historically Tg Ab undetectable as of 01/2022 with a Tg of 17.2 ng/mL    Thyroid ultrasound 01/2022 did not show any evidence of recurrent on the right but involution of left thyroid nodule.     Patient with history of seizures that are usually noted the week prior and the week of menstruations hence she is on medroxyprogesterone injections to prevent menstruations , it appears that there is a 7-year limit on medroxyprogesterone use and this is her seventh year .  Patient and mother are anxious about  the next step, she follows with gynecology .  Historically she has not responded well to COC's   Due to complaints of hirsutism on her initial visit with me we proceeded with workup which showed normal DHEA-S, testosterone, 17 hydroxyprogesterone  Patient did have abnormal saliva cortisol with 1 sample at 0.102 (<0.090), the second sample was normal at 0.053 in   05/2022.  Mother with metastatic breast ca   Materna FH of  Goiter and thyroid disease   Levothyroxine 50 mcg daily      SUBJECTIVE:    Today (12/06/22): Sandra Collins is here for follow-up on history of PTC and postoperative hypothyroid.  Patient endorses weight loss, she had made lifestyle changes with exercise and dietary modifications She denies any local neck symptoms She was recently treated for strep throat, no glucocorticoids She has rare tremors but no palpitations She has diarrhea recently that she attributes to dietary changes Hirsutism is stable Bedtime 10 PM to 8 AM    Levothyroxine 50 mcg daily   HISTORY:  Past Medical History:  Past Medical History:  Diagnosis Date   Depression    Epilepsy (HCC)    Headache    Hyperlipidemia    Hypothyroidism    Short-term memory loss    Caused by seizures   Thyroid cancer [facility])    Past Surgical History:  Past Surgical History:  Procedure Laterality Date   BRAIN SURGERY  05/03/2006   IMPLANTATION VAGAL NERVE STIMULATOR  09/28/2015   IR 3D INDEPENDENT WKST  07/03/2021   IR ANGIO INTRA EXTRACRAN SEL INTERNAL CAROTID BILAT MOD SED  07/03/2021   IR ANGIO VERTEBRAL SEL VERTEBRAL UNI R MOD SED  07/03/2021   IR CT HEAD LTD  07/03/2021   IR RADIOLOGIST EVAL & MGMT  06/12/2021   IR RADIOLOGIST EVAL & MGMT  07/25/2021   IR TRANSCATH/EMBOLIZ  07/03/2021   RADIOLOGY WITH ANESTHESIA N/A 07/03/2021   Procedure: Cerebral angiogram  with intent to embolize brain aneurysm;  Surgeon: Julieanne Cotton, MD;  Location: [facility] OR;  Service: Radiology;  Laterality: N/A;   THYROID SURGERY     cancer   VAGUS NERVE STIMULATOR INSERTION Left 09/25/2021   Procedure: Vagus Nerve Stimulator  Battery Change;  Surgeon: Lisbeth Renshaw, MD;  Location: [facility] OR;  Service: Neurosurgery;  Laterality: Left;    Social History:  reports that she has never smoked. She has never used smokeless tobacco. She reports current alcohol use. She reports that she does  not use drugs. Family History: family history includes Breast cancer in her mother; Cancer in her mother.   HOME MEDICATIONS: Allergies as of 12/06/2022       Reactions   Estrogens Other (See Comments)   Seizures   Tape Hives, Itching, Swelling, Rash   Must use paper tape.    Amoxicillin Hives   Latex Hives   Metronidazole Hives   Pregabalin Other (See Comments)   Excessive weight gain        Medication List        Accurate as of December 06, 2022 10:23 AM. If you have any questions, ask your nurse or doctor.          aspirin EC 81 MG tablet Take 81 mg by mouth daily. Swallow whole.   b complex vitamins capsule Take 1 capsule by mouth daily.   Biotin 1000 MCG tablet Take 1,000 mcg by mouth daily.   CALCIUM 1200+D3 PO Take 1 tablet by mouth 2 (two) times daily.   cholecalciferol 25 MCG (1000 UNIT) tablet Commonly known as: VITAMIN D3 Take 1,000 Units by mouth daily.   cyanocobalamin 1000 MCG tablet Commonly known as: VITAMIN B12 Take 1,000 mcg by mouth daily.   Estradiol 10 MCG Tabs vaginal tablet Place 1 tablet (10 mcg total) vaginally 2 (two) times a week.   Fish Oil 1200 MG Cpdr Take 1,200 mg by mouth daily.   ibuprofen 200 MG tablet Commonly known as: ADVIL Take 400 mg by mouth every 6 (six) hours as needed for headache.   levothyroxine 50 MCG tablet Commonly known as: SYNTHROID Take 1 tablet (50 mcg total) by mouth daily.   lidocaine 2 % solution Commonly known as: XYLOCAINE Use as directed 10 mLs in the mouth or throat every 4 (four) hours as needed for mouth pain.   LORazepam 2 MG tablet Commonly known as: ATIVAN Take 2 mg by mouth daily as needed for seizure.   medroxyPROGESTERone Acetate 150 MG/ML Susy Inject 1 mL (150 mg total) into the muscle every 3 (three) months.   Melatonin 10 MG Tabs Take 10 mg by mouth at bedtime.   multivitamin with minerals tablet Take 1 tablet by mouth daily.   NON FORMULARY Place 125 mg under the tongue  in the morning and at bedtime. CBD oil   topiramate 100 MG tablet Commonly known as: TOPAMAX Take 1 tablet twice a day (take with 50mg  tablet for total of 150mg  twice a day)   topiramate 50 MG tablet Commonly known as: TOPAMAX Take 1 tablet twice a day (take with 100mg  tablet for total of 150mg  twice a day)   vitamin C 1000 MG tablet Take 1,000 mg by mouth daily.          REVIEW OF SYSTEMS: A comprehensive ROS was conducted with the patient and is negative except as per HPI     OBJECTIVE:  VS: BP 120/72 (BP Location: Left Arm, Patient Position: Sitting, Cuff Size: Large)  Pulse 80   Ht 5' 2.5" (1.588 m)   Wt 200 lb (90.7 kg)   SpO2 99%   BMI 36.00 kg/m    Wt Readings from Last 3 Encounters:  12/06/22 200 lb (90.7 kg)  11/12/22 202 lb (91.6 kg)  09/27/22 200 lb (90.7 kg)     EXAM: General: Pt appears well and is in NAD  Neck: General: Supple without adenopathy. Thyroid: No goiter or nodules appreciated.   Lungs: Clear with good BS bilat   Heart: Auscultation: RRR.  Extremities:  BL LE: No pretibial edema   Mental Status: Judgment, insight: Intact Orientation: Oriented to time, place, and person Mood and affect: No depression, anxiety, or agitation     DATA REVIEWED:     Latest Reference Range & Units 12/06/22 11:30  TSH 0.35 - 5.50 uIU/mL 1.49    Latest Reference Range & Units 06/14/22 08:20  DHEA-SO4 19 - 237 mcg/dL 151    Latest Reference Range & Units 06/14/22 08:20  Testosterone, Total, LC-MS-MS 2 - 45 ng/dL 24  [id number], LC/MS/MS  ng/dL 27  TSH 5.46 - 2.70 uIU/mL 2.04  Thyroglobulin ng/mL 8.1  Thyroglobulin Ab < or = 1 IU/mL <1   Thyroid Ultrasound 01/31/2022  Estimated total number of nodules >/= 1 cm: 0   Number of spongiform nodules >/=  2 cm not described below (TR1): 0   Number of mixed cystic and solid nodules >/= 1.5 cm not described below (TR2): 0   _________________________________________________________    Decreasing size of previously mixed cystic and solid nodule in the left mid gland. The cystic component is largely resolved leaving a subcentimeter residual hypoechoic nodule. No new nodules or suspicious features are identified.   Interrogation of the right thyroid resection bed demonstrates no evidence of residual or recurrent thyroid tissue or nodularity.   IMPRESSION: 1. Surgical changes of right hemithyroidectomy without evidence of residual or recurrent thyroid tissue. 2. Involuting left mid thyroid nodule. Involution over time is consistent with a benign process.     ASSESSMENT/PLAN/RECOMMENDATIONS:   Hx of PTC:  -She is s/p right hemithyroidectomy in 2018 due to [facility] -She is at low risk for recurrence -No biochemical or structural evidence of recurrence -Last thyroid ultrasound 01/2022 no evidence of recurrence on the right, evaluation of the left thyroid nodule -TSH goal 0.5-2.0 u IU/mL -Repeat TSH at goal, no changes    Medications : Levothyroxine 50 MCG daily    2.  Abnormal saliva cortisol:  -During evaluation for hirsutism she has been noted with 1 abnormal saliva cortisol, the second sample was normal. -Will proceed with 24-hour urinary collection for cortisol  F/U in 6 months    Signed electronically by: Lyndle Herrlich, MD  [facility]  [facility] [address] [place] [place], [place] [address] Phone: [phone] FAX: [phone]   CC: Myrlene Broker, MD [address] [place] [address] Phone: [phone] Fax: [phone]   Return to Endocrinology clinic as below: Future Appointments  Date Time Provider Department Center  12/13/2022 11:30 AM Van Clines, MD [facility] None  12/31/2022  1:00 PM Myrlene Broker, MD [facility] None

## 2022-12-06 NOTE — Patient Instructions (Signed)

## 2022-12-07 MED ORDER — LEVOTHYROXINE SODIUM 50 MCG PO TABS: 50.0000 ug | ORAL_TABLET | Freq: Every day | ORAL | 3 refills | Status: DC

## 2022-12-13 ENCOUNTER — Encounter: Payer: Self-pay | Admitting: Neurology

## 2022-12-13 ENCOUNTER — Ambulatory Visit (INDEPENDENT_AMBULATORY_CARE_PROVIDER_SITE_OTHER): Payer: Medicare Other | Admitting: Neurology

## 2022-12-13 VITALS — BP 109/73 | HR 89 | Ht 63.0 in | Wt 200.0 lb

## 2022-12-13 DIAGNOSIS — G40219 Localization-related (focal) (partial) symptomatic epilepsy and epileptic syndromes with complex partial seizures, intractable, without status epilepticus: Secondary | ICD-10-CM

## 2022-12-13 MED ORDER — TOPIRAMATE 50 MG PO TABS: ORAL_TABLET | ORAL | 3 refills | Status: DC

## 2022-12-13 MED ORDER — TOPIRAMATE 100 MG PO TABS: ORAL_TABLET | ORAL | 3 refills | Status: DC

## 2022-12-13 MED ORDER — LORAZEPAM 2 MG PO TABS: 2.0000 mg | ORAL_TABLET | Freq: Every day | ORAL | 5 refills | Status: DC | PRN

## 2022-12-13 NOTE — Progress Notes (Signed)
NEUROLOGY FOLLOW UP OFFICE NOTE  Sandra Collins [id number] January 20, 1986  HISTORY OF PRESENT ILLNESS: I had the pleasure of seeing Sandra Collins in follow-up in the neurology clinic on 12/13/2022.  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 She is again accompanied by her mother who helps supplement the history today.  Records and images were personally reviewed where available. Since her last visit, hormonal issues have quieted down, she continues on the vaginal tablets with no further significant temperature fluctuations. She reports seizures are better, she has had only a few in June in the setting of exposure to heat while camping and when the [facility] was not on. While camping, as soon as she had the feeling, she used a cooling towel and took 2 Ibuprofen. They also increased the CBD dose to 125mg  BID from 90mg  BID. She would like to go back down on Topiramate dose due to word-finding difficulties. She had to read at church and had difficulty with language and though processing, which embarrassed her. She denies any headaches, dizziness, focal numbness/tingling/weakness. No falls.    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 Records from Dr. Logan Bores and provided by her mother were reviewed. Seizures started at 74 months of age (10/1986). She was started on Depakote in 07/1989. She underwent EMU monitoring in [place] in 2007 and had left temporal lobe surgery in [place], [place] that was unsuccessful. VNS was placed in 2017. Main trigger noted for her seizures is a temperature of 99 degrees, whether from illness, physical activity, stress, or menstrual period. She started a CBD schedule in 2020 with her neurologist in [place] and they were able to wean off Lamotrigine in 2021. She continues  on Topiramate 200mg  BID and CBD 70mg  BID. CBD is obtained from [employer] in [place].  Seizure semiology has included staring with bilateral hand automatisms followed by generalized tonic posturing, and drop attacks. She will have an aura of grabbing on to something then has loss of consciousness. Her mother notes shakiness and pupils dilate. On her last visit with Dr. Logan Bores in 08/2020, they reported her last seizure with loss of consciousness/GTC was in 12/2019, however today report that she had 4 seizures last year with spacing out or behavioral arrest for 15 seconds. She had a seizure yesterday in her sleep occurring on and off from 2:30am to 6am. She almost fell off the bed. She was biting her pillows and teddy bear trying to stay calm. She could feel her body shaking a little bit for 15-30 seconds, occurring repeatedly and waking her up. She did have a low grade temperature and took Ibuprofen and 2mg  lorazepam, with no further events for the rest of the day. She denies any loss of consciousness with the recent seizures. She has rare body jerks. She denies any olfactory/gustatory hallucinations, focal numbness/tingling/weakness. She has rare migraines and takes Ibuprofen with good response. She has some swallowing difficulties due to her thyroid issues, and back pain due to scoliosis. No bowel/bladder dysfunction. Sleep is good. Mood is alright. She manages her own medications without issues.She lives with her mother and seizure dog. She does not drive.   Epilepsy Risk Factors:  A paternal aunt had epilepsy in childhood, 2 paternal cousins have epilepsy. She had a febrile convulsion in childhood. Otherwise she had a normal birth  with no history of CNS infections such as meningitis/encephalitis, significant traumatic brain injury, neurosurgical procedures  Prior ASMs: Lamotrigine, Sabril, Dilantin, Trileptal, Lyrica, Onfi, Depakote, Zarontin, Felbatol, Neurontin, Tegretol, Phenobarbital   PAST  MEDICAL HISTORY: Past Medical History:  Diagnosis Date   Depression    Epilepsy (HCC)    Headache    Hyperlipidemia    Hypothyroidism    Short-term memory loss    Caused by seizures   Thyroid cancer (HCC)     MEDICATIONS: Current Outpatient Medications on File Prior to Visit  Medication Sig Dispense Refill   Ascorbic Acid (VITAMIN C) 1000 MG tablet Take 1,000 mg by mouth daily.     aspirin EC 81 MG tablet Take 81 mg by mouth daily. Swallow whole.     b complex vitamins capsule Take 1 capsule by mouth daily.     Biotin 1000 MCG tablet Take 1,000 mcg by mouth daily.     Calcium-Magnesium-Vitamin D (CALCIUM 1200+D3 PO) Take 1 tablet by mouth 2 (two) times daily.     cholecalciferol (VITAMIN D3) 25 MCG (1000 UNIT) tablet Take 1,000 Units by mouth daily.     Estradiol 10 MCG TABS vaginal tablet Place 1 tablet (10 mcg total) vaginally 2 (two) times a week. 24 tablet 2   ibuprofen (ADVIL) 200 MG tablet Take 400 mg by mouth every 6 (six) hours as needed for headache.     levothyroxine (SYNTHROID) 50 MCG tablet Take 1 tablet (50 mcg total) by mouth daily. 90 tablet 3   lidocaine (XYLOCAINE) 2 % solution Use as directed 10 mLs in the mouth or throat every 4 (four) hours as needed for mouth pain. 100 mL 0   LORazepam (ATIVAN) 2 MG tablet Take 2 mg by mouth daily as needed for seizure.     medroxyPROGESTERone Acetate 150 MG/ML SUSY Inject 1 mL (150 mg total) into the muscle every 3 (three) months. 1 mL 3   Melatonin 10 MG TABS Take 10 mg by mouth at bedtime.     Multiple Vitamins-Minerals (MULTIVITAMIN WITH MINERALS) tablet Take 1 tablet by mouth daily.     NON FORMULARY Place 125 mg under the tongue in the morning and at bedtime. CBD oil     Omega-3 Fatty Acids (FISH OIL) 1200 MG CPDR Take 1,200 mg by mouth daily.     topiramate (TOPAMAX) 100 MG tablet Take 1 tablet twice a day (take with 50mg  tablet for total of 150mg  twice a day) 60 tablet 6   topiramate (TOPAMAX) 50 MG tablet Take 1 tablet  twice a day (take with 100mg  tablet for total of 150mg  twice a day) 60 tablet 6   vitamin B-12 (CYANOCOBALAMIN) 1000 MCG tablet Take 1,000 mcg by mouth daily.     No current facility-administered medications on file prior to visit.    ALLERGIES: Allergies  Allergen Reactions   Estrogens Other (See Comments)    Seizures   Tape Hives, Itching, Swelling and Rash    Must use paper tape.    Amoxicillin Hives   Latex Hives   Metronidazole Hives   Pregabalin Other (See Comments)    Excessive weight gain      FAMILY HISTORY: Family History  Problem Relation Age of Onset   Breast cancer Mother    Cancer Mother     SOCIAL HISTORY: Social History   Socioeconomic History   Marital status: Single    Spouse name: Not on file   Number of children: 0   Years of education:  12   Highest education level: 12th grade  Occupational History   Not on file  Tobacco Use   Smoking status: Never   Smokeless tobacco: Never  Vaping Use   Vaping status: Never Used  Substance and Sexual Activity   Alcohol use: Yes    Comment: rare   Drug use: Never   Sexual activity: Yes    Birth control/protection: Injection  Other Topics Concern   Not on file  Social History Narrative   Right handed   Lives with mother in a one story townhome   Caffeine one a day   2 dogs   Social Determinants of Health   Financial Resource Strain: Low Risk  (08/16/2022)   Overall Financial Resource Strain (CARDIA)    Difficulty of Paying Living Expenses: Not very hard  Food Insecurity: No Food Insecurity (08/16/2022)   Hunger Vital Sign    Worried About Running Out of Food in the Last Year: Never true    Ran Out of Food in the Last Year: Never true  Transportation Needs: No Transportation Needs (08/16/2022)   PRAPARE - Administrator, Civil Service (Medical): No    Lack of Transportation (Non-Medical): No  Physical Activity: Inactive (08/16/2022)   Exercise Vital Sign    Days of Exercise per Week:  0 days    Minutes of Exercise per Session: 30 min  Stress: No Stress Concern Present (08/16/2022)   [employer] of Occupational Health - Occupational Stress Questionnaire    Feeling of Stress : Only a little  Social Connections: Moderately Integrated (08/16/2022)   Social Connection and Isolation Panel [NHANES]    Frequency of Communication with Friends and Family: More than three times a week    Frequency of Social Gatherings with Friends and Family: Once a week    Attends Religious Services: More than 4 times per year    Active Member of [employer] or Organizations: Yes    Attends Engineer, structural: More than 4 times per year    Marital Status: Never married  Intimate Partner Violence: Not At Risk (03/22/2022)   Humiliation, Afraid, Rape, and Kick questionnaire    Fear of Current or Ex-Partner: No    Emotionally Abused: No    Physically Abused: No    Sexually Abused: No     PHYSICAL EXAM: Vitals:   12/13/22 1110  BP: 109/73  Pulse: 89  SpO2: 98%   General: No acute distress Head:  Normocephalic/atraumatic Skin/Extremities: No rash, no edema Neurological Exam: alert and awake. No aphasia or dysarthria. Fund of knowledge is appropriate.   Attention and concentration are normal.   Cranial nerves: Pupils equal, round. Extraocular movements intact with no nystagmus. Visual fields full.  No facial asymmetry.  Motor: Bulk and tone normal, muscle strength 5/5 throughout with no pronator drift.   Finger to nose testing intact.  Gait narrow-based and steady, able to tandem walk adequately.  Romberg negative.   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 MRI brain did not show any significant intracranial abnormalities. 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 She has been doing better with hormonal/temperature fluctuations. She has had a few seizures but improved from  before. We discussed slowly reducing Topiramate due to word-finding issues, reduce to 100mg  in AM, 150mg  in PM. She is on CBD 125mg  BID obtained from [facility] in [place]. She does not drive. Follow-up in 4  months, call for any changes   Thank you for allowing me to participate in her care.  Please do not hesitate to call for any questions or concerns.   Patrcia Dolly, M.D.   CC: Dr. Okey Dupre

## 2022-12-13 NOTE — Patient Instructions (Signed)
Always good to see you.  Reduce Topamax: take 100mg  in AM, 150mg  in PM  2. Continue all your other medications  3. Follow-up in 4 months, call for any changes   Seizure Precautions: 1. If medication has been prescribed for you to prevent seizures, take it exactly as directed.  Do not stop taking the medicine without talking to your doctor first, even if you have not had a seizure in a long time.   2. Avoid activities in which a seizure would cause danger to yourself or to others.  Don't operate dangerous machinery, swim alone, or climb in high or dangerous places, such as on ladders, roofs, or girders.  Do not drive unless your doctor says you may.  3. If you have any warning that you may have a seizure, lay down in a safe place where you can't hurt yourself.    4.  No driving for 6 months from last seizure, as per [facility].   Please refer to the following link on the Epilepsy Foundation of America's website for more information: http://www.epilepsyfoundation.org/answerplace/Social/driving/drivingu.cfm   5.  Maintain good sleep hygiene.  6.  Notify your neurology if you are planning pregnancy or if you become pregnant.  7.  Contact your doctor if you have any problems that may be related to the medicine you are taking.  8.  Call 911 and bring the patient back to the ED if:        A.  The seizure lasts longer than 5 minutes.       B.  The patient doesn't awaken shortly after the seizure  C.  The patient has new problems such as difficulty seeing, speaking or moving  D.  The patient was injured during the seizure  E.  The patient has a temperature over 102 F (39C)  F.  The patient vomited and now is having trouble breathing

## 2022-12-27 ENCOUNTER — Other Ambulatory Visit: Payer: Medicare Other

## 2022-12-27 DIAGNOSIS — R7989 Other specified abnormal findings of blood chemistry: Secondary | ICD-10-CM

## 2022-12-31 ENCOUNTER — Encounter: Payer: Self-pay | Admitting: Internal Medicine

## 2022-12-31 ENCOUNTER — Ambulatory Visit: Payer: Medicare Other | Admitting: Internal Medicine

## 2022-12-31 VITALS — BP 108/70 | HR 70 | Temp 98.6°F | Ht 63.0 in | Wt 199.2 lb

## 2022-12-31 DIAGNOSIS — Z803 Family history of malignant neoplasm of breast: Secondary | ICD-10-CM | POA: Diagnosis not present

## 2022-12-31 DIAGNOSIS — R569 Unspecified convulsions: Secondary | ICD-10-CM

## 2022-12-31 DIAGNOSIS — C73 Malignant neoplasm of thyroid gland: Secondary | ICD-10-CM

## 2022-12-31 NOTE — Assessment & Plan Note (Signed)
No recent seizure and they are tapering topiramate per neurology instructions.

## 2022-12-31 NOTE — Assessment & Plan Note (Signed)
Discussed recent US thyroid and she has pending urinary cortisol 24 hour given timeline to expect results.

## 2022-12-31 NOTE — Progress Notes (Signed)
   Subjective:   Patient ID: Sandra Collins, female    DOB: 1986/02/05, 37 y.o.   MRN: [id number]  HPI The patient is a 37 YO female coming in for follow up, no new seizures is in process of tapering topiramate dose slightly through neuro.  Review of Systems  Constitutional: Negative.   HENT: Negative.    Eyes: Negative.   Respiratory:  Negative for cough, chest tightness and shortness of breath.   Cardiovascular:  Negative for chest pain, palpitations and leg swelling.  Gastrointestinal:  Negative for abdominal distention, abdominal pain, constipation, diarrhea, nausea and vomiting.  Musculoskeletal: Negative.   Skin: Negative.   Neurological: Negative.   Psychiatric/Behavioral: Negative.      Objective:  Physical Exam Constitutional:      Appearance: She is well-developed.  HENT:     Head: Normocephalic and atraumatic.  Cardiovascular:     Rate and Rhythm: Normal rate and regular rhythm.  Pulmonary:     Effort: Pulmonary effort is normal. No respiratory distress.     Breath sounds: Normal breath sounds. No wheezing or rales.  Abdominal:     General: Bowel sounds are normal. There is no distension.     Palpations: Abdomen is soft.     Tenderness: There is no abdominal tenderness. There is no rebound.  Musculoskeletal:     Cervical back: Normal range of motion.  Skin:    General: Skin is warm and dry.  Neurological:     Mental Status: She is alert and oriented to person, place, and time.     Coordination: Coordination normal.     Vitals:   12/31/22 1257  BP: 108/70  Pulse: 70  Temp: 98.6 F (37 C)  TempSrc: Oral  SpO2: 99%  Weight: 199 lb 4 oz (90.4 kg)  Height: 5\' 3"  (1.6 m)    Assessment & Plan:

## 2022-12-31 NOTE — Assessment & Plan Note (Signed)
Reviewed recent mammogram and ultrasound results with her and they understand.

## 2023-04-02 ENCOUNTER — Ambulatory Visit: Payer: Medicare Other | Admitting: Internal Medicine

## 2023-04-02 ENCOUNTER — Encounter: Payer: Self-pay | Admitting: Internal Medicine

## 2023-04-02 VITALS — BP 115/76 | HR 61 | Temp 98.2°F | Ht 63.0 in | Wt 199.0 lb

## 2023-04-02 DIAGNOSIS — C73 Malignant neoplasm of thyroid gland: Secondary | ICD-10-CM

## 2023-04-02 DIAGNOSIS — Z0000 Encounter for general adult medical examination without abnormal findings: Secondary | ICD-10-CM | POA: Diagnosis not present

## 2023-04-02 DIAGNOSIS — Z803 Family history of malignant neoplasm of breast: Secondary | ICD-10-CM

## 2023-04-02 DIAGNOSIS — E559 Vitamin D deficiency, unspecified: Secondary | ICD-10-CM | POA: Diagnosis not present

## 2023-04-02 DIAGNOSIS — I671 Cerebral aneurysm, nonruptured: Secondary | ICD-10-CM

## 2023-04-02 DIAGNOSIS — E7800 Pure hypercholesterolemia, unspecified: Secondary | ICD-10-CM | POA: Diagnosis not present

## 2023-04-02 DIAGNOSIS — Z3009 Encounter for other general counseling and advice on contraception: Secondary | ICD-10-CM

## 2023-04-02 DIAGNOSIS — R4589 Other symptoms and signs involving emotional state: Secondary | ICD-10-CM

## 2023-04-02 LAB — COMPREHENSIVE METABOLIC PANEL
ALT: 22 U/L (ref 0–35)
AST: 15 U/L (ref 0–37)
Albumin: 4.5 g/dL (ref 3.5–5.2)
Alkaline Phosphatase: 46 U/L (ref 39–117)
BUN: 12 mg/dL (ref 6–23)
CO2: 22 meq/L (ref 19–32)
Calcium: 9.2 mg/dL (ref 8.4–10.5)
Chloride: 110 meq/L (ref 96–112)
Creatinine, Ser: 1.05 mg/dL (ref 0.40–1.20)
GFR: 69.05 mL/min (ref >60.00)
Glucose, Bld: 88 mg/dL (ref 70–99)
Potassium: 3.9 meq/L (ref 3.5–5.1)
Sodium: 140 meq/L (ref 135–145)
Total Bilirubin: 0.4 mg/dL (ref 0.2–1.2)
Total Protein: 7.6 g/dL (ref 6.0–8.3)

## 2023-04-02 LAB — CBC
HCT: 44 % (ref 36.0–46.0)
Hemoglobin: 14.2 g/dL (ref 12.0–15.0)
MCHC: 32.3 g/dL (ref 30.0–36.0)
MCV: 94.1 fL (ref 78.0–100.0)
Platelets: 232 10*3/uL (ref 150.0–400.0)
RBC: 4.68 Mil/uL (ref 3.87–5.11)
RDW: 12.7 % (ref 11.5–15.5)
WBC: 6.4 10*3/uL (ref 4.0–10.5)

## 2023-04-02 LAB — LIPID PANEL
Cholesterol: 173 mg/dL (ref 0–200)
HDL: 45 mg/dL (ref >39.00)
LDL Cholesterol: 108 mg/dL — ABNORMAL HIGH (ref 0–99)
NonHDL: 127.63
Total CHOL/HDL Ratio: 4
Triglycerides: 96 mg/dL (ref 0.0–149.0)
VLDL: 19.2 mg/dL (ref 0.0–40.0)

## 2023-04-02 LAB — VITAMIN D 25 HYDROXY (VIT D DEFICIENCY, FRACTURES): VITD: 34.92 ng/mL (ref 30.00–100.00)

## 2023-04-02 LAB — HEMOGLOBIN A1C: Hgb A1c MFr Bld: 5.3 % (ref 4.6–6.5)

## 2023-04-02 LAB — T4, FREE: Free T4: 0.96 ng/dL (ref 0.60–1.60)

## 2023-04-02 LAB — TSH: TSH: 1.51 u[IU]/mL (ref 0.35–5.50)

## 2023-04-02 NOTE — Progress Notes (Unsigned)
Subjective:   Patient ID: Sandra Collins, female    DOB: [birth year], 38 y.o.   MRN: [id number]  HPI Here for medicare wellness and physical, no new complaints. Please see A/P for status and treatment of chronic medical problems.   Diet: heart healthy Physical activity: active at the gym Depression/mood screen: negative Hearing: intact to whispered voice Visual acuity: grossly normal, performs annual eye exam  ADLs: capable Fall risk: none Home safety: good Cognitive evaluation: intact to orientation, naming, recall and repetition EOL planning: adv directives discussed  Flowsheet Row Office Visit from 12/31/2022 in [facility] [place]  PHQ-2 Total Score 0       Flowsheet Row Office Visit from 12/31/2022 in [facility] [place]  PHQ-9 Total Score 0         11/12/2022   10:01 AM 12/13/2022   11:14 AM 12/31/2022    1:02 PM 03/29/2023    9:34 AM 04/02/2023    9:25 AM  Fall Risk  Falls in the past year? 0 0 0 1 1  Was there an injury with Fall? 0 0 0 0 0  Fall Risk Category Calculator 0 0 0 2 2  Patient at Risk for Falls Due to   No Fall Risks    Fall risk Follow up Falls evaluation completed Falls evaluation completed Falls evaluation completed  Falls evaluation completed    I have personally reviewed and have noted 1. The patient's medical and social history - reviewed today no changes 2. Their use of alcohol, tobacco or illicit drugs 3. Their current medications and supplements 4. The patient's functional ability including ADL's, fall risks, home safety risks and hearing or visual impairment. 5. Diet and physical activities 6. Evidence for depression or mood disorders 7. Care team reviewed and updated 8.  The patient is not on an opioid pain medication.  Patient Care Team: Myrlene Broker, MD as PCP - General (Internal Medicine) Van Clines, MD as Consulting Physician (Neurology) Marzella Schlein., MD as  Consulting Physician (Ophthalmology) [place], [facility] as Consulting Physician (Otolaryngology) Past Medical History:  Diagnosis Date   Depression    Epilepsy [facility])    Headache    Hyperlipidemia    Hypothyroidism    Short-term memory loss    Caused by seizures   Thyroid cancer [facility])    Past Surgical History:  Procedure Laterality Date   BRAIN SURGERY  05/03/2006   IMPLANTATION VAGAL NERVE STIMULATOR  09/28/2015   IR 3D INDEPENDENT WKST  07/03/2021   IR ANGIO INTRA EXTRACRAN SEL INTERNAL CAROTID BILAT MOD SED  07/03/2021   IR ANGIO VERTEBRAL SEL VERTEBRAL UNI R MOD SED  07/03/2021   IR CT HEAD LTD  07/03/2021   IR RADIOLOGIST EVAL & MGMT  06/12/2021   IR RADIOLOGIST EVAL & MGMT  07/25/2021   IR TRANSCATH/EMBOLIZ  07/03/2021   RADIOLOGY WITH ANESTHESIA N/A 07/03/2021   Procedure: Cerebral angiogram with intent to embolize brain aneurysm;  Surgeon: Julieanne Cotton, MD;  [facility];  Service: Radiology;  Laterality: N/A;   THYROID SURGERY     cancer   VAGUS NERVE STIMULATOR INSERTION Left 09/25/2021   Procedure: Vagus Nerve Stimulator  Battery Change;  Surgeon: Lisbeth Renshaw, MD;  Location: [facility] OR;  Service: Neurosurgery;  Laterality: Left;   Family History  Problem Relation Age of Onset   Breast cancer Mother    Cancer Mother    Review of Systems  Objective:  Physical Exam  Vitals:   04/02/23 0918  BP: 115/76  Pulse: 61  Temp: 98.2 F (36.8 C)  TempSrc: Oral  SpO2: 100%  Weight: 199 lb (90.3 kg)  Height: 5\' 3"  (1.6 m)    Assessment & Plan:

## 2023-04-02 NOTE — Patient Instructions (Signed)
You can use cortisone on a qtip for the ear itching.

## 2023-04-03 NOTE — Assessment & Plan Note (Signed)
Stay on depo-provera every 3 months regularly to avoid temp change with ovulation which triggers seizures for her.

## 2023-04-03 NOTE — Assessment & Plan Note (Signed)
Checking TSH and free T4 and adjust as needed synthroid 50 mcg daily.  

## 2023-04-03 NOTE — Assessment & Plan Note (Signed)
Managed with coping skills and regular care.

## 2023-04-03 NOTE — Assessment & Plan Note (Signed)
Checking lipid panel and adjust as needed. Has been exercising more in the last few months.

## 2023-04-03 NOTE — Assessment & Plan Note (Signed)
Checking vitamin D level.  

## 2023-04-03 NOTE — Assessment & Plan Note (Signed)
Stable on imaging earlier this year.

## 2023-04-03 NOTE — Assessment & Plan Note (Signed)
Flu shot declines. Tetanus declines. Mammogram due 2025, pap smear up to date with gyn. Counseled about sun safety and mole surveillance. Counseled about the dangers of distracted driving. Given 10 year screening recommendations.

## 2023-04-03 NOTE — Assessment & Plan Note (Signed)
Has mammograms for follow up scheduled.

## 2023-04-15 ENCOUNTER — Ambulatory Visit (INDEPENDENT_AMBULATORY_CARE_PROVIDER_SITE_OTHER): Payer: Medicare Other | Admitting: Neurology

## 2023-04-15 ENCOUNTER — Encounter: Payer: Self-pay | Admitting: Neurology

## 2023-04-15 VITALS — BP 121/86 | HR 81 | Ht 63.0 in | Wt 202.0 lb

## 2023-04-15 DIAGNOSIS — G40219 Localization-related (focal) (partial) symptomatic epilepsy and epileptic syndromes with complex partial seizures, intractable, without status epilepticus: Secondary | ICD-10-CM

## 2023-04-15 MED ORDER — LORAZEPAM 2 MG PO TABS: 2.0000 mg | ORAL_TABLET | Freq: Every day | ORAL | 5 refills | Status: DC | PRN

## 2023-04-15 MED ORDER — TOPIRAMATE 100 MG PO TABS: ORAL_TABLET | ORAL | 3 refills | Status: DC

## 2023-04-15 NOTE — Progress Notes (Signed)
NEUROLOGY FOLLOW UP OFFICE NOTE  Tawsha Vangorp [id number] 18-Jan-1986  HISTORY OF PRESENT ILLNESS: I had the pleasure of seeing Noa Mauller in follow-up in the neurology clinic on 04/15/2023.  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 She is again accompanied by her mother who helps supplement the history today.  Records and images were personally reviewed where available.  She brings her seizure calendar. She had symptoms from 9/19-9/21, temps were at 99.9-100.1. She had a seizure with urinary incontinence on 9/19, and seizure warning signs on 9/20-21. She had taken Ibuprofen and Ativan. She has been on a stronger dose of CBD (250mg  every morning) since July. She is on Topiramate 100mg  BID with less word-finding difficulties. No headaches, dizziness. She had some numbness in her left hand from weight lifting, this is better now. No falls. Melatonin helps sometimes with sleep.    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 Records from Dr. Logan Bores and provided by her mother were reviewed. Seizures started at 64 months of age (10/1986). She was started on Depakote in 07/1989. She underwent EMU monitoring in [place] in 2007 and had left temporal lobe surgery in [place], [place] that was unsuccessful. VNS was placed in 2017. Main trigger noted for her seizures is a temperature of 99 degrees, whether from illness, physical activity, stress, or menstrual period. She started a CBD schedule in 2020 with her neurologist in [place] and they were able to wean off Lamotrigine in 2021. She continues on Topiramate 200mg  BID and CBD 70mg  BID. CBD is obtained from [employer] in [place].  Seizure semiology has included staring with bilateral hand automatisms followed by generalized tonic  posturing, and drop attacks. She will have an aura of grabbing on to something then has loss of consciousness. Her mother notes shakiness and pupils dilate. On her last visit with Dr. Logan Bores in 08/2020, they reported her last seizure with loss of consciousness/GTC was in 12/2019, however today report that she had 4 seizures last year with spacing out or behavioral arrest for 15 seconds. She had a seizure yesterday in her sleep occurring on and off from 2:30am to 6am. She almost fell off the bed. She was biting her pillows and teddy bear trying to stay calm. She could feel her body shaking a little bit for 15-30 seconds, occurring repeatedly and waking her up. She did have a low grade temperature and took Ibuprofen and 2mg  lorazepam, with no further events for the rest of the day. She denies any loss of consciousness with the recent seizures. She has rare body jerks. She denies any olfactory/gustatory hallucinations, focal numbness/tingling/weakness. She has rare migraines and takes Ibuprofen with good response. She has some swallowing difficulties due to her thyroid issues, and back pain due to scoliosis. No bowel/bladder dysfunction. Sleep is good. Mood is alright. She manages her own medications without issues.She lives with her mother and seizure dog. She does not drive.   Epilepsy Risk Factors:  A paternal aunt had epilepsy in childhood, 2 paternal cousins have epilepsy. She had a febrile convulsion in childhood. Otherwise she had a normal birth with no history of CNS infections such as meningitis/encephalitis, significant traumatic brain injury, neurosurgical procedures  Prior ASMs: Lamotrigine, Sabril, Dilantin, Trileptal, Lyrica, Onfi, Depakote, Zarontin, Felbatol, Neurontin, Tegretol, Phenobarbital  PAST MEDICAL HISTORY: Past  Medical History:  Diagnosis Date   Depression    Epilepsy (HCC)    Headache    Hyperlipidemia    Hypothyroidism    Short-term memory loss    Caused by seizures   Thyroid  cancer [facility])     MEDICATIONS: Current Outpatient Medications on File Prior to Visit  Medication Sig Dispense Refill   Ascorbic Acid (VITAMIN C) 1000 MG tablet Take 1,000 mg by mouth daily.     aspirin EC 81 MG tablet Take 81 mg by mouth daily. Swallow whole.     b complex vitamins capsule Take 1 capsule by mouth daily.     Biotin 1000 MCG tablet Take 1,000 mcg by mouth daily.     Calcium-Magnesium-Vitamin D (CALCIUM 1200+D3 PO) Take 1 tablet by mouth 2 (two) times daily.     cholecalciferol (VITAMIN D3) 25 MCG (1000 UNIT) tablet Take 1,000 Units by mouth daily.     Estradiol 10 MCG TABS vaginal tablet Place 1 tablet (10 mcg total) vaginally 2 (two) times a week. 24 tablet 2   ibuprofen (ADVIL) 200 MG tablet Take 400 mg by mouth every 6 (six) hours as needed for headache.     levothyroxine (SYNTHROID) 50 MCG tablet Take 1 tablet (50 mcg total) by mouth daily. 90 tablet 3   lidocaine (XYLOCAINE) 2 % solution Use as directed 10 mLs in the mouth or throat every 4 (four) hours as needed for mouth pain. 100 mL 0   LORazepam (ATIVAN) 2 MG tablet Take 1 tablet (2 mg total) by mouth daily as needed for seizure. 10 tablet 5   medroxyPROGESTERone Acetate 150 MG/ML SUSY Inject 1 mL (150 mg total) into the muscle every 3 (three) months. 1 mL 3   Melatonin 10 MG TABS Take 10 mg by mouth at bedtime.     Multiple Vitamins-Minerals (MULTIVITAMIN WITH MINERALS) tablet Take 1 tablet by mouth daily.     NON FORMULARY Place 125 mg under the tongue in the morning and at bedtime. CBD oil     Omega-3 Fatty Acids (FISH OIL) 1200 MG CPDR Take 1,200 mg by mouth daily.     topiramate (TOPAMAX) 100 MG tablet Take 1 tablet in AM, take in 1 tablet in PM (take PM dose with 50mg  for 150mg  in PM) 180 tablet 3   topiramate (TOPAMAX) 50 MG tablet Take 1 tablet every night (take with 100mg  tablet for 150mg  every night) 90 tablet 3   vitamin B-12 (CYANOCOBALAMIN) 1000 MCG tablet Take 1,000 mcg by mouth daily.     No current  facility-administered medications on file prior to visit.    ALLERGIES: Allergies  Allergen Reactions   Estrogens Other (See Comments)    Seizures   Tape Hives, Itching, Swelling and Rash    Must use paper tape.    Amoxicillin Hives   Latex Hives   Metronidazole Hives   Pregabalin Other (See Comments)    Excessive weight gain      FAMILY HISTORY: Family History  Problem Relation Age of Onset   Breast cancer Mother    Cancer Mother     SOCIAL HISTORY: Social History   Socioeconomic History   Marital status: Single    Spouse name: Not on file   Number of children: 0   Years of education: 12   Highest education level: 12th grade  Occupational History   Not on file  Tobacco Use   Smoking status: Never   Smokeless tobacco: Never  Vaping Use  Vaping status: Never Used  Substance and Sexual Activity   Alcohol use: Yes    Comment: rare   Drug use: Never   Sexual activity: Yes    Birth control/protection: Injection  Other Topics Concern   Not on file  Social History Narrative   Right handed   Lives with mother in a one story townhome   Caffeine one a day   2 dogs   Social Determinants of Health   Financial Resource Strain: Medium Risk (03/29/2023)   Overall Financial Resource Strain (CARDIA)    Difficulty of Paying Living Expenses: Somewhat hard  Food Insecurity: No Food Insecurity (03/29/2023)   Hunger Vital Sign    Worried About Running Out of Food in the Last Year: Never true    Ran Out of Food in the Last Year: Never true  Transportation Needs: No Transportation Needs (03/29/2023)   PRAPARE - Administrator, Civil Service (Medical): No    Lack of Transportation (Non-Medical): No  Physical Activity: Insufficiently Active (03/29/2023)   Exercise Vital Sign    Days of Exercise per Week: 3 days    Minutes of Exercise per Session: 10 min  Stress: No Stress Concern Present (03/29/2023)   [employer] of Occupational Health - Occupational  Stress Questionnaire    Feeling of Stress : Only a little  Social Connections: Moderately Integrated (03/29/2023)   Social Connection and Isolation Panel [NHANES]    Frequency of Communication with Friends and Family: More than three times a week    Frequency of Social Gatherings with Friends and Family: Once a week    Attends Religious Services: 1 to 4 times per year    Active Member of [employer] or Organizations: Yes    Attends Banker Meetings: 1 to 4 times per year    Marital Status: Never married  Intimate Partner Violence: Not At Risk (03/22/2022)   Humiliation, Afraid, Rape, and Kick questionnaire    Fear of Current or Ex-Partner: No    Emotionally Abused: No    Physically Abused: No    Sexually Abused: No     PHYSICAL EXAM: Vitals:   04/15/23 1553  BP: 121/86  Pulse: 81  SpO2: 99%   General: No acute distress Head:  Normocephalic/atraumatic Skin/Extremities: No rash, no edema Neurological Exam: alert and awake. No aphasia or dysarthria. Fund of knowledge is appropriate. Attention and concentration are normal.   Cranial nerves: Pupils equal, round. Extraocular movements intact with no nystagmus. Visual fields full.  No facial asymmetry.  Motor: Bulk and tone normal, muscle strength 5/5 throughout with no pronator drift.   Finger to nose testing intact.  Gait narrow-based and steady, able to tandem walk adequately.  Romberg negative.   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 MRI brain did not show any significant intracranial abnormalities. 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 She is overall stable on current regimen of CBD (obtained from [facility] in [facility]) and Topiramate 100mg  BID. She wonders about further reducing Topiramate or switching to Lamotrigine, however we agreed to continue on current dose for now. She does not drive. Follow-up in 6  months, call for any changes.      Thank you for allowing me to participate in her care.  Please do not hesitate to call for any questions or concerns.    Patrcia Dolly, M.D.   CC: Dr. Okey Dupre

## 2023-04-15 NOTE — Patient Instructions (Signed)
Always good to see you. Continue all your medications. Continue seizure medication. Follow-up in 6 months, call for any changes   Seizure Precautions: 1. If medication has been prescribed for you to prevent seizures, take it exactly as directed.  Do not stop taking the medicine without talking to your doctor first, even if you have not had a seizure in a long time.   2. Avoid activities in which a seizure would cause danger to yourself or to others.  Don't operate dangerous machinery, swim alone, or climb in high or dangerous places, such as on ladders, roofs, or girders.  Do not drive unless your doctor says you may.  3. If you have any warning that you may have a seizure, lay down in a safe place where you can't hurt yourself.    4.  No driving for 6 months from last seizure, as per [facility].   Please refer to the following link on the Epilepsy Foundation of America's website for more information: http://www.epilepsyfoundation.org/answerplace/Social/driving/drivingu.cfm   5.  Maintain good sleep hygiene. Avoid alcohol.  6.  Notify your neurology if you are planning pregnancy or if you become pregnant.  7.  Contact your doctor if you have any problems that may be related to the medicine you are taking.  8.  Call 911 and bring the patient back to the ED if:        A.  The seizure lasts longer than 5 minutes.       B.  The patient doesn't awaken shortly after the seizure  C.  The patient has new problems such as difficulty seeing, speaking or moving  D.  The patient was injured during the seizure  E.  The patient has a temperature over 102 F (39C)  F.  The patient vomited and now is having trouble breathing

## 2023-04-29 ENCOUNTER — Other Ambulatory Visit: Payer: Self-pay | Admitting: *Deleted

## 2023-04-29 DIAGNOSIS — N952 Postmenopausal atrophic vaginitis: Secondary | ICD-10-CM

## 2023-04-29 NOTE — Telephone Encounter (Signed)
Medication refill request: estradiol  Last AEX:  06-22-22 JJ Next AEX: pt requests to see BS only-- message to appointment desk to schedule  Last MMG (if hormonal medication request): 11-30-22 BIRADS 3 probably benign  Refill authorized: Please advise.   Patient left voicemail on Triage line requesting refill of estradiol. Returned call to patient, spoke to patient's mother and patient on speaker phone. Mother stated how important estradiol was for patient as it helps with her seizures. RN advised patient would be due for annual exam after 06-22-22. Message to appointment desk to schedule. Patient states she will run out of medication next Wednesday.   Medication pended for #24, 0RF. Please refill if appropriate.

## 2023-04-30 MED ORDER — ESTRADIOL 10 MCG VA TABS: 1.0000 | ORAL_TABLET | VAGINAL | 0 refills | Status: DC

## 2023-05-01 ENCOUNTER — Other Ambulatory Visit: Payer: Self-pay | Admitting: Obstetrics and Gynecology

## 2023-05-01 DIAGNOSIS — N952 Postmenopausal atrophic vaginitis: Secondary | ICD-10-CM

## 2023-05-01 NOTE — Telephone Encounter (Signed)
Medication refill request: estradiol vaginal tablet Last AEX:  06-22-22 Next AEX: 08-15-23 Last MMG (if hormonal medication request): 11-30-22 bilateral & left breast u/s birads 3: probably benign Refill authorized: pt request 90 day supply. Please approve if appropriate

## 2023-06-03 ENCOUNTER — Other Ambulatory Visit: Payer: Medicare Other

## 2023-06-11 ENCOUNTER — Ambulatory Visit
Admission: RE | Admit: 2023-06-11 | Discharge: 2023-06-11 | Disposition: A | Discharge status: Home / Self Care | Payer: Medicare Other | Source: Ambulatory Visit | Attending: Internal Medicine | Admitting: Internal Medicine

## 2023-06-11 DIAGNOSIS — N6320 Unspecified lump in the left breast, unspecified quadrant: Secondary | ICD-10-CM

## 2023-06-12 ENCOUNTER — Encounter: Payer: Self-pay | Admitting: Internal Medicine

## 2023-06-12 ENCOUNTER — Ambulatory Visit (INDEPENDENT_AMBULATORY_CARE_PROVIDER_SITE_OTHER): Payer: Medicare Other | Admitting: Internal Medicine

## 2023-06-12 VITALS — BP 116/72 | HR 74 | Ht 63.0 in | Wt 201.0 lb

## 2023-06-12 DIAGNOSIS — Z85850 Personal history of malignant neoplasm of thyroid: Secondary | ICD-10-CM | POA: Diagnosis not present

## 2023-06-12 NOTE — Patient Instructions (Signed)

## 2023-06-12 NOTE — Progress Notes (Signed)
Name: Sandra Collins  MRN/ DOB: [id number], [birth year]    Age/ Sex: 38 y.o., female    PCP: Myrlene Broker, MD   Reason for Endocrinology Evaluation: PTC     Date of Initial Endocrinology Evaluation: 06/05/2022    HPI: Ms. Sandra Collins is a 38 y.o. female with a past medical history of Hx brain aneurysm repair, seizure d/o. The patient presented for initial endocrinology clinic visit on 06/05/2022 for consultative assistance with her PTC.   Patient is s/p right hemithyroidectomy on 12//2018 while living in [place] with PTC, multifocal, no extrathyroidal extension, no angioinvasion, no lymphatic invasion, report stated perineural invasion could not be determined.  Tumor greatest size 1.1 cm.  Thyroid ultrasound prior to surgery demonstrated a right thyroid nodule 1.4 x 1.1 x 0.9 cm with no nodularity on the left  In 2022 and per  review of her records through care everywhere she had comprehensive workup for left cervical adenopathy which included ultrasound, CT neck, FNA with subsequent flow cytometry.  Historically Tg Ab undetectable as of 01/2022 with a Tg of 17.2 ng/mL    Thyroid ultrasound 01/2022 did not show any evidence of recurrent on the right but involution of left thyroid nodule.     Patient with history of seizures that are usually noted the week prior and the week of menstruations hence she is on medroxyprogesterone injections to prevent menstruations , it appears that there is a 7-year limit on medroxyprogesterone use and this is her seventh year .  Patient and mother are anxious about  the next step, she follows with gynecology .  Historically she has not responded well to COC's   Due to complaints of hirsutism on her initial visit with me we proceeded with workup which showed normal DHEA-S, testosterone, 17 hydroxyprogesterone  Patient did have abnormal saliva cortisol with 1 sample at 0.102 (<0.090), the second sample was normal at 0.053 in  05/2022.   24-hour urinary cortisol was normal at 16.5 in August 2024  Mother with metastatic breast ca   Materna FH of  Goiter and thyroid disease   SUBJECTIVE:    Today (06/12/23): Sandra Collins is here for follow-up on history of PTC and postoperative hypothyroid.   Patient continues to follow-up with neurology for epilepsy Weight  stable  Denies palpitations  Denies tremors  She denies any local neck symptoms   Levothyroxine 50 mcg daily   HISTORY:  Past Medical History:  Past Medical History:  Diagnosis Date   Depression    Epilepsy (HCC)    Headache    Hyperlipidemia    Hypothyroidism    Short-term memory loss    Caused by seizures   Thyroid cancer [facility])    Past Surgical History:  Past Surgical History:  Procedure Laterality Date   BRAIN SURGERY  05/03/2006   IMPLANTATION VAGAL NERVE STIMULATOR  09/28/2015   IR 3D INDEPENDENT WKST  07/03/2021   IR ANGIO INTRA EXTRACRAN SEL INTERNAL CAROTID BILAT MOD SED  07/03/2021   IR ANGIO VERTEBRAL SEL VERTEBRAL UNI R MOD SED  07/03/2021   IR CT HEAD LTD  07/03/2021   IR RADIOLOGIST EVAL & MGMT  06/12/2021   IR RADIOLOGIST EVAL & MGMT  07/25/2021   IR TRANSCATH/EMBOLIZ  07/03/2021   RADIOLOGY WITH ANESTHESIA N/A 07/03/2021   Procedure: Cerebral angiogram with intent to embolize brain aneurysm;  Surgeon: Julieanne Cotton, MD;  Location: [facility] OR;  Service: Radiology;  Laterality: N/A;   THYROID SURGERY  cancer   VAGUS NERVE STIMULATOR INSERTION Left 09/25/2021   Procedure: Vagus Nerve Stimulator  Battery Change;  Surgeon: Lisbeth Renshaw, MD;  Location: [facility] OR;  Service: Neurosurgery;  Laterality: Left;    Social History:  reports that she has never smoked. She has never used smokeless tobacco. She reports current alcohol use. She reports that she does not use drugs. Family History: family history includes Breast cancer in her mother; Cancer in her mother.   HOME MEDICATIONS: Allergies as of 06/12/2023       Reactions   Estrogens  Other (See Comments)   Seizures   Tape Hives, Itching, Swelling, Rash   Must use paper tape.    Amoxicillin Hives   Latex Hives   Metronidazole Hives   Pregabalin Other (See Comments)   Excessive weight gain        Medication List        Accurate as of June 12, 2023 10:52 AM. If you have any questions, ask your nurse or doctor.          STOP taking these medications    lidocaine 2 % solution Commonly known as: XYLOCAINE Stopped by: Johnney Ou Giabella Duhart       TAKE these medications    aspirin EC 81 MG tablet Take 81 mg by mouth daily. Swallow whole.   b complex vitamins capsule Take 1 capsule by mouth daily.   Biotin 1000 MCG tablet Take 1,000 mcg by mouth daily.   CALCIUM 1200+D3 PO Take 1 tablet by mouth 2 (two) times daily.   cholecalciferol 25 MCG (1000 UNIT) tablet Commonly known as: VITAMIN D3 Take 1,000 Units by mouth daily.   cyanocobalamin 1000 MCG tablet Commonly known as: VITAMIN B12 Take 1,000 mcg by mouth daily.   Estradiol 10 MCG Tabs vaginal tablet INSERT 1 TABLET(10 MCG) VAGINALLY 2 TIMES A WEEK   Fish Oil 1200 MG Cpdr Take 1,200 mg by mouth daily.   ibuprofen 200 MG tablet Commonly known as: ADVIL Take 400 mg by mouth every 6 (six) hours as needed for headache.   levothyroxine 50 MCG tablet Commonly known as: SYNTHROID Take 1 tablet (50 mcg total) by mouth daily. What changed: how much to take   LORazepam 2 MG tablet Commonly known as: ATIVAN Take 1 tablet (2 mg total) by mouth daily as needed for seizure.   medroxyPROGESTERone Acetate 150 MG/ML Susy Inject 1 mL (150 mg total) into the muscle every 3 (three) months.   Melatonin 10 MG Tabs Take 10 mg by mouth at bedtime.   multivitamin with minerals tablet Take 1 tablet by mouth daily.   NON FORMULARY Place 125 mg under the tongue in the morning and at bedtime. CBD oil   topiramate 100 MG tablet Commonly known as: TOPAMAX Take 1 tablet twice a day   vitamin C  1000 MG tablet Take 1,000 mg by mouth daily.          REVIEW OF SYSTEMS: A comprehensive ROS was conducted with the patient and is negative except as per HPI     OBJECTIVE:  VS: BP 116/72 (BP Location: Right Wrist, Patient Position: Sitting, Cuff Size: Small)   Pulse 74   Ht 5\' 3"  (1.6 m)   Wt 201 lb (91.2 kg)   SpO2 99%   BMI 35.61 kg/m    Wt Readings from Last 3 Encounters:  06/12/23 201 lb (91.2 kg)  04/15/23 202 lb (91.6 kg)  04/02/23 199 lb (90.3 kg)  EXAM: General: Pt appears well and is in NAD  Neck: General: Supple without adenopathy. Thyroid: No goiter or nodules appreciated.   Lungs: Clear with good BS bilat   Heart: Auscultation: RRR.  Extremities:  BL LE: No pretibial edema   Mental Status: Judgment, insight: Intact Orientation: Oriented to time, place, and person Mood and affect: No depression, anxiety, or agitation     DATA REVIEWED:  Latest Reference Range & Units 06/12/23 11:10  TSH mIU/L 1.30      Thyroid Ultrasound 01/31/2022  Estimated total number of nodules >/= 1 cm: 0   Number of spongiform nodules >/=  2 cm not described below (TR1): 0   Number of mixed cystic and solid nodules >/= 1.5 cm not described below (TR2): 0   _________________________________________________________   Decreasing size of previously mixed cystic and solid nodule in the left mid gland. The cystic component is largely resolved leaving a subcentimeter residual hypoechoic nodule. No new nodules or suspicious features are identified.   Interrogation of the right thyroid resection bed demonstrates no evidence of residual or recurrent thyroid tissue or nodularity.   IMPRESSION: 1. Surgical changes of right hemithyroidectomy without evidence of residual or recurrent thyroid tissue. 2. Involuting left mid thyroid nodule. Involution over time is consistent with a benign process.     ASSESSMENT/PLAN/RECOMMENDATIONS:   Hx of PTC:  -She is s/p right  hemithyroidectomy in 2018 due to [facility] -She is at low risk for recurrence -No biochemical or structural evidence of recurrence -TSH goal 0.5-2.0 u IU/mL -Repeat TSH remains within normal range -Baseline thyroglobulin level 8.1 NG/mL, with a negative thyroglobulin antibody -Pending labs today   Medications : Levothyroxine 50 MCG daily    2.  Abnormal saliva cortisol:  -During evaluation for hirsutism she has been noted with 1 abnormal saliva cortisol, the second sample was normal.  24-hour urinary cortisol was normal at 16.5 12/2022   F/U in 1 yr    Signed electronically by: Lyndle Herrlich, MD  [facility] Endocrinology  [facility] [address] [place] [place], [place] [address] Phone: [phone] FAX: [phone]   CC: Myrlene Broker, MD [address] [place] [address] Phone: [phone] Fax: [phone]   Return to Endocrinology clinic as below: Future Appointments  Date Time Provider Department Center  08/15/2023 11:00 AM Patton Salles, MD [facility] None  11/19/2023  8:30 AM Van Clines, MD [facility] None

## 2023-06-13 MED ORDER — LEVOTHYROXINE SODIUM 50 MCG PO TABS: 50.0000 ug | ORAL_TABLET | Freq: Every day | ORAL | 3 refills | Status: DC

## 2023-06-14 LAB — THYROGLOBULIN LEVEL: Thyroglobulin: 9 ng/mL

## 2023-06-14 LAB — TSH: TSH: 1.3 m[IU]/L

## 2023-06-14 LAB — THYROGLOBULIN ANTIBODY: Thyroglobulin Ab: <1 [IU]/mL (ref <=1)

## 2023-06-17 ENCOUNTER — Encounter: Payer: Self-pay | Admitting: Internal Medicine

## 2023-06-25 ENCOUNTER — Ambulatory Visit: Payer: Self-pay | Admitting: Internal Medicine

## 2023-06-25 ENCOUNTER — Telehealth: Payer: Self-pay | Admitting: Internal Medicine

## 2023-06-25 NOTE — Telephone Encounter (Signed)
Copied from CRM [id number]. Topic: Clinical - Request for Lab/Test Order >> Jun 25, 2023 12:18 PM Orinda Kenner C wrote: Reason for CRM: Patient [phone] wants to know when CT and MRI is suppose to be done? Please advise and call back.

## 2023-06-25 NOTE — Telephone Encounter (Signed)
Chief Complaint: Difficulty breathing Symptoms: difficulty breathing, chest tightness, coughing Frequency: intermittently Pertinent Negatives: Patient denies face or tongue swelling Disposition: [] ED /[] Urgent Care (no appt availability in office) / [x] Appointment(In office/virtual)/ []  [facility]/ [] Home Care/ [] Refused Recommended Disposition /[] [facility]/ []  Follow-up with PCP Additional Notes: Patient called in stating she has random episodes of potential allergy responses to certain environmental and situational occurrences, such as heavy cigarette smoke, heavy smell of spices in restaurant, construction dust, dust while cleaning home, etc. Patient states right now she is symptom-free and not experiencing an attack, but when it occurs, she experiences chest tightness, difficulty breathing, occasional cough, occasional watery eyes, occasional facial numbness, occasional throat swelling. Patient states she takes an antihistamine daily, but has not seen her PCP about this yet and would like an appointment to discuss with PCP. Appointment created for earliest available date. Patient's mother stated this week would not be available for scheduling. Educated patient to go to nearest ER or call 911 if symptoms return and patient feels she cannot breathe, or throat is swelling, or chest pain is constant. Patient verbalized understanding.    Copied from CRM [id number]. Topic: Clinical - Red Word Triage >> Jun 25, 2023 12:19 PM Sandra Collins wrote: Red Word that prompted transfer to Nurse Triage: Patient [phone] is having a lot of breathing issue, trouble breathing, tightness is chest, shortness of breath, may be allergies from food, like spices or from indoors or outdoors like pollen, dust off and on for a few months. Patient states she take antihistamine daily. Patient denies any symptoms right now during the phone call. Reason for Disposition  [1] MILD longstanding difficulty  breathing AND [2]  SAME as normal  Answer Assessment - Initial Assessment Questions 1. RESPIRATORY STATUS: "Describe your breathing?" (e.g., wheezing, shortness of breath, unable to speak, severe coughing)      Right now patient is not having any symptoms, but patient states when she is exposed to certain circumstances (heavy smell of spices in restaurants, cigarette smoke, construction dust, etc) patient will get a tight chest, difficulty breathing, throat feels swollen, coughing 2. ONSET: "When did this breathing problem begin?"      Intermittent upon exposure 3. PATTERN "Does the difficult breathing come and go, or has it been constant since it started?"      Comes and goes 4. SEVERITY: "How bad is your breathing?" (e.g., mild, moderate, severe)    - MILD: No SOB at rest, mild SOB with walking, speaks normally in sentences, can lie down, no retractions, pulse < 100.    - MODERATE: SOB at rest, SOB with minimal exertion and prefers to sit, cannot lie down flat, speaks in phrases, mild retractions, audible wheezing, pulse 100-120.    - SEVERE: Very SOB at rest, speaks in single words, struggling to breathe, sitting hunched forward, retractions, pulse > 120      Unable to assess 5. RECURRENT SYMPTOM: "Have you had difficulty breathing before?" If Yes, ask: "When was the last time?" and "What happened that time?"      YEs 8. CAUSE: "What do you think is causing the breathing problem?"      Patient is unsure, but believes it may be related to allergens 9. OTHER SYMPTOMS: "Do you have any other symptoms? (e.g., dizziness, runny nose, cough, chest pain, fever)     Lightheadedness, facial numbness, watery eyes, coughing, chest tightness  Protocols used: Breathing Difficulty-A-AH

## 2023-06-26 NOTE — Telephone Encounter (Signed)
I do not see a MRI or CT upcoming. Please advise if this is something patient suppose to be doing?

## 2023-06-27 ENCOUNTER — Telehealth: Payer: Self-pay | Admitting: Neurology

## 2023-06-27 NOTE — Telephone Encounter (Signed)
Pt called in upset. She stated she is supposed to be seeing Dr. Corliss Skains this year and has not heard from him. She has been trying to find his phone number and keeps being sent to the breast center. She said Dr. Karel Jarvis found him for her last year.

## 2023-06-27 NOTE — Telephone Encounter (Signed)
Pt given the number to [facility] so she can schedule her testing she was confused with the change of name

## 2023-06-27 NOTE — Telephone Encounter (Signed)
I don't see information about what CT and MRI she is referring to (for what condition or body part)

## 2023-07-04 ENCOUNTER — Encounter: Payer: Self-pay | Admitting: Internal Medicine

## 2023-07-04 ENCOUNTER — Ambulatory Visit: Payer: Medicare Other | Admitting: Internal Medicine

## 2023-07-04 VITALS — BP 118/82 | HR 93 | Temp 98.1°F | Ht 63.0 in | Wt 202.0 lb

## 2023-07-04 DIAGNOSIS — Z9109 Other allergy status, other than to drugs and biological substances: Secondary | ICD-10-CM | POA: Diagnosis not present

## 2023-07-04 NOTE — Patient Instructions (Signed)
 We will get you in for allergy  testing

## 2023-07-04 NOTE — Assessment & Plan Note (Signed)
 Counseling given on avoiding triggers. Advised not to spray any aerosols. Use mask in environments with odors or known allergens. Use claritin daily and can add flonase prn. Referral to allergy  for skin testing.

## 2023-07-04 NOTE — Progress Notes (Signed)
   Subjective:   Patient ID: Sandra Collins, female    DOB: Jan 20, 1986, 38 y.o.   MRN: [id number]  Shortness of Breath Pertinent negatives include no abdominal pain, chest pain, leg swelling or vomiting.   The patient is a 38 YO female coming in for allergy  triggers. Smoke and strong odors and lysol spray have recently triggered her and she will get temporary tightness in chest during episode. When she moves away from the smell within a minute or two this is resolved. No lip or face or throat swelling. No rash or hives currently. Allergy  testing done years ago wants to update. Using claritin daily now had stopped due to winter before these recent flares. Has air purifier in room. Dog and in room with her and is on the bed.  Review of Systems  Constitutional: Negative.   HENT: Negative.    Eyes: Negative.   Respiratory:  Positive for chest tightness and shortness of breath. Negative for cough.   Cardiovascular:  Negative for chest pain, palpitations and leg swelling.  Gastrointestinal:  Negative for abdominal distention, abdominal pain, constipation, diarrhea, nausea and vomiting.  Musculoskeletal: Negative.   Skin: Negative.   Neurological: Negative.   Psychiatric/Behavioral: Negative.      Objective:  Physical Exam Constitutional:      Appearance: She is well-developed.  HENT:     Head: Normocephalic and atraumatic.  Cardiovascular:     Rate and Rhythm: Normal rate and regular rhythm.  Pulmonary:     Effort: Pulmonary effort is normal. No respiratory distress.     Breath sounds: Normal breath sounds. No wheezing or rales.  Abdominal:     General: Bowel sounds are normal. There is no distension.     Palpations: Abdomen is soft.     Tenderness: There is no abdominal tenderness. There is no rebound.  Musculoskeletal:     Cervical back: Normal range of motion.  Skin:    General: Skin is warm and dry.  Neurological:     Mental Status: She is alert and oriented to person,  place, and time.     Coordination: Coordination normal.     Vitals:   07/04/23 0928  BP: 118/82  Pulse: 93  Temp: 98.1 F (36.7 C)  TempSrc: Oral  SpO2: 99%  Weight: 202 lb (91.6 kg)  Height: 5' 3 (1.6 m)    Assessment & Plan:

## 2023-07-19 ENCOUNTER — Other Ambulatory Visit: Payer: Self-pay | Admitting: Obstetrics and Gynecology

## 2023-07-19 ENCOUNTER — Other Ambulatory Visit: Payer: Self-pay

## 2023-07-19 DIAGNOSIS — N952 Postmenopausal atrophic vaginitis: Secondary | ICD-10-CM

## 2023-07-19 MED ORDER — ESTRADIOL 10 MCG VA TABS: 10.0000 ug | ORAL_TABLET | VAGINAL | 0 refills | Status: DC

## 2023-07-19 NOTE — Telephone Encounter (Signed)
Med refill request: estradiol 10 mcg vaginal insert--patient requests #24 (90 day supply) Last AEX: 06/22/22 Next AEX: 08/15/23 Last MMG (if hormonal med) 06/11/23 BI-RADS 3 probably benign Refill authorized: estradiol 10 mcg vaginal insert #24.  Please approve or deny as appropriate.

## 2023-07-19 NOTE — Telephone Encounter (Signed)
Duplicate request. Rx already sent on 07/19/23 by Rosalyn Gess, MD.

## 2023-07-30 ENCOUNTER — Encounter: Payer: Self-pay | Admitting: Allergy & Immunology

## 2023-07-30 ENCOUNTER — Other Ambulatory Visit: Payer: Self-pay

## 2023-07-30 ENCOUNTER — Ambulatory Visit (INDEPENDENT_AMBULATORY_CARE_PROVIDER_SITE_OTHER): Payer: Medicare Other | Admitting: Allergy & Immunology

## 2023-07-30 VITALS — BP 130/82 | HR 79 | Temp 98.6°F | Resp 18 | Ht 62.99 in | Wt 201.0 lb

## 2023-07-30 DIAGNOSIS — J310 Chronic rhinitis: Secondary | ICD-10-CM | POA: Diagnosis not present

## 2023-07-30 DIAGNOSIS — J709 Respiratory conditions due to unspecified external agent: Secondary | ICD-10-CM

## 2023-07-30 MED ORDER — ALBUTEROL SULFATE HFA 108 (90 BASE) MCG/ACT IN AERS: 2.0000 | INHALATION_SPRAY | Freq: Four times a day (QID) | RESPIRATORY_TRACT | 2 refills | Status: DC | PRN

## 2023-07-30 NOTE — Progress Notes (Signed)
 NEW PATIENT  Date of Service/Encounter:  07/30/23  Consult requested by: Sandra Broker, MD   Assessment:   Chronic rhinitis - planning for skin testing at the next visit  Shortness of breath - normal spiro   Complicated past medical history including a seizure disorder  Disabled status  Plan/Recommendations:   1. SOB - Lung testing looks excellent today. - I do not think that we need to add anything daily to help with symptoms. - I would like to know whether the albuterol helps.  - Spacer sample and demonstration provided. - Daily controller medication(s): NOTHING - Prior to physical activity: albuterol 2 puffs 10-15 minutes before physical activity. - Rescue medications: albuterol 4 puffs every 4-6 hours as needed - Asthma control goals:  * Full participation in all desired activities (may need albuterol before activity) * Albuterol use two time or less a week on average (not counting use with activity) * Cough interfering with sleep two time or less a month * Oral steroids no more than once a year * No hospitalizations  2. Chronic rhinitis - Because of insurance stipulations, we cannot do skin testing on the same day as your first visit. - We are all working to fight this, but for now we need to do two separate visits.  - We will know more after we do testing at the next visit.  - The skin testing visit can be squeezed in at your convenience.  - Then we can make a more full plan to address all of your symptoms. - Be sure to stop your antihistamines for 3 days before this appointment.  - We will figure out a good regimen for you once we do the testing.    3. Return in about 1 week (around 08/06/2023) for ALLERGY TESTING (1-55). You can have the follow up appointment with Dr. Dellis Collins or a Nurse Practicioner (our Nurse Practitioners are excellent and always have Physician oversight!).   This note in its entirety was forwarded to the Provider who requested this  consultation.  Subjective:   Sandra Collins is a 38 y.o. 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  Discussed the use of AI scribe software for clinical note transcription with the patient and/or guardian, who gave verbal consent to proceed.  Sandra Collins was referred by Sandra Broker, MD.     Sandra Collins is a 38 y.o. female presenting for an evaluation of asthma and allergies .   Asthma/Respiratory Symptom History: She experiences episodes of breathing difficulties triggered by exposure to smoke, vaping, certain plants, spicy food, and dogs. These episodes are characterized by rhinorrhea, lightheadedness, and dyspnea, which resolve upon leaving the triggering environment. No cough. No history of asthma. No  inhaler use.   Allergic Rhinitis Symptom History: She has a history of allergies, with previous testing in [place] over fifteen years ago revealing allergies to cockroaches, cottonwood trees, and dust. She currently manages her symptoms with Claritin, taking one daily, and has been using All Clear daily, which provides some relief. She has not yet used Flonase.  Food Allergy Symptom  History: She recalls specific incidents, such as exposure to spicy food cooking causing her eyes and nose to run and feeling lightheaded to the point of near syncope. Another episode occurred while walking her service dog, where her tongue and chest tightened, but symptoms resolved upon leaving the area. She also experienced swelling of the tongue upon waking, which improved after leaving the room.   She has a significant past medical history of epilepsy, with seizures starting at 44 old. Her seizures are now well-controlled with topiramate, 100 mg in the morning and 100 mg at night, and the use of CBD oil. She also has a vagus nerve stimulator and a service dog for medical alert purposes.  She moved from [place] to [place] to be near her brother and his family. She is on disability due to epilepsy and has a service dog for medical alert purposes.  Otherwise, there is no history of other atopic diseases, including . There is no significant infectious history. Vaccinations are up to date.    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    Amoxicillin Hives   Latex Hives   Metronidazole Hives   Pregabalin Other (See Comments)    Excessive weight gain        Medication List        Accurate as of July 30, 2023  1:12 PM. If you have any questions, ask your nurse or doctor.          albuterol 108 (90 Base) MCG/ACT inhaler Commonly known as: VENTOLIN HFA Inhale 2 puffs into the lungs every 6 (six) hours as needed for wheezing or shortness of breath. Started by: Alfonse Spruce   aspirin EC 81 MG tablet Take 81 mg by mouth daily. Swallow whole.   b complex vitamins capsule Take 1 capsule by mouth daily.   Biotin 1000 MCG tablet Take 1,000 mcg by mouth daily.   CALCIUM 1200+D3 PO Take 1 tablet by mouth 2 (two) times daily.   cholecalciferol 25 MCG (1000 UNIT) tablet Commonly known as: VITAMIN D3 Take 1,000 Units by mouth daily.   cyanocobalamin 1000 MCG tablet Commonly known as: VITAMIN B12 Take 1,000 mcg by mouth daily.   Estradiol 10 MCG Tabs vaginal tablet Place 1 tablet (10 mcg total) vaginally 2 (two) times a week.   Fish Oil 1200 MG Cpdr  Take 1,200 mg by mouth daily.   ibuprofen 200 MG tablet Commonly known as: ADVIL Take 400 mg by mouth every 6 (six) hours as needed for headache.   levothyroxine 50 MCG tablet Commonly known as: SYNTHROID Take 1 tablet (50 mcg total) by mouth daily.   LORazepam 2 MG tablet Commonly known as: ATIVAN Take 1 tablet (2 mg total) by mouth daily as needed for seizure.   medroxyPROGESTERone Acetate 150 MG/ML Susy Inject 1 mL (150 mg total) into the muscle every 3 (three) months.   Melatonin 10 MG Tabs Take 10 mg by mouth at bedtime.   multivitamin with minerals tablet Take 1 tablet by mouth daily.   NON FORMULARY Place 125 mg under the tongue in the morning and at bedtime. CBD oil   topiramate 100 MG tablet Commonly known as: TOPAMAX Take 1 tablet twice a day   vitamin C 1000 MG tablet Take 1,000 mg by mouth daily.        Birth History: non-contributory  Developmental History: non-contributory  Past Surgical History: Past  Surgical History:  Procedure Laterality Date   BRAIN SURGERY  05/03/2006   IMPLANTATION VAGAL NERVE STIMULATOR  09/28/2015   IR 3D INDEPENDENT WKST  07/03/2021   IR ANGIO INTRA EXTRACRAN SEL INTERNAL CAROTID BILAT MOD SED  07/03/2021   IR ANGIO VERTEBRAL SEL VERTEBRAL UNI R MOD SED  07/03/2021   IR CT HEAD LTD  07/03/2021   IR RADIOLOGIST EVAL & MGMT  06/12/2021   IR RADIOLOGIST EVAL & MGMT  07/25/2021   IR TRANSCATH/EMBOLIZ  07/03/2021   RADIOLOGY WITH ANESTHESIA N/A 07/03/2021   Procedure: Cerebral angiogram with intent to embolize brain aneurysm;  Surgeon: Julieanne Cotton, MD;  Location: [facility] OR;  Service: Radiology;  Laterality: N/A;   THYROID SURGERY     cancer   VAGUS NERVE STIMULATOR INSERTION Left 09/25/2021   Procedure: Vagus Nerve Stimulator  Battery Change;  Surgeon: Lisbeth Renshaw, MD;  Location: [facility] OR;  Service: Neurosurgery;  Laterality: Left;     Family History: Family History  Problem Relation Age of Onset   Asthma Mother    Allergic rhinitis Mother    Breast cancer Mother    Cancer Mother    Angioedema Neg Hx    Eczema Neg Hx    Urticaria Neg Hx      Social History: Brianda lives at home with her family.  They live in a house that was built in 1963.  There is cork flooring with rugs throughout the home.  They have electric heating and central cooling.  There is a dog inside of the home.  It is her service dog.  There are no dust mite covers on the bed or the pillows.  There is no tobacco exposure.  She is currently on disability.  They do have air purifier's in the bedrooms.  She has not been a smoker.   Review of systems otherwise negative other than that mentioned in the HPI.    Objective:   Blood pressure 130/82, pulse 79, temperature 98.6 F (37 C), temperature source Temporal, resp. rate 18, height 5' 2.99" (1.6 m), weight 201 lb (91.2 kg), SpO2 99%. Body mass index is 35.61 kg/m.     Physical Exam Vitals reviewed.  Constitutional:      Appearance: She  is well-developed.     Comments: Talkative.  HENT:     Head: Normocephalic and atraumatic.     Right Ear: Tympanic membrane, ear canal and external ear normal.  No drainage, swelling or tenderness. Tympanic membrane is not injected, scarred, erythematous, retracted or bulging.     Left Ear: Tympanic membrane, ear canal and external ear normal. No drainage, swelling or tenderness. Tympanic membrane is not injected, scarred, erythematous, retracted or bulging.     Nose: No nasal deformity, septal deviation, mucosal edema or rhinorrhea.     Right Turbinates: Enlarged, swollen and pale.     Left Turbinates: Enlarged, swollen and pale.     Right Sinus: No maxillary sinus tenderness or frontal sinus tenderness.     Left Sinus: No maxillary sinus tenderness or frontal sinus tenderness.     Mouth/Throat:     Mouth: Mucous membranes are not pale and not dry.     Pharynx: Uvula midline.  Eyes:     General:        Right eye: No discharge.        Left eye: No discharge.     Conjunctiva/sclera: Conjunctivae normal.     Right eye: Right conjunctiva is not injected. No chemosis.    Left eye: Left conjunctiva is not injected. No chemosis.    Pupils: Pupils are equal, round, and reactive to light.  Cardiovascular:     Rate and Rhythm: Normal rate and regular rhythm.     Heart sounds: Normal heart sounds.  Pulmonary:     Effort: Pulmonary effort is normal. No tachypnea, accessory muscle usage or respiratory distress.     Breath sounds: Normal breath sounds. No wheezing, rhonchi or rales.     Comments: Moving air well in all lung fields.  No increased work of breathing. Chest:     Chest wall: No tenderness.  Abdominal:     Tenderness: There is no abdominal tenderness. There is no guarding or rebound.  Lymphadenopathy:     Head:     Right side of head: No submandibular, tonsillar or occipital adenopathy.     Left side of head: No submandibular, tonsillar or occipital adenopathy.     Cervical: No  cervical adenopathy.  Skin:    Coloration: Skin is not pale.     Findings: No abrasion, erythema, petechiae or rash. Rash is not papular, urticarial or vesicular.  Neurological:     Mental Status: She is alert.      Diagnostic studies:    Spirometry: results normal (FEV1: 3.27/108%, FVC: 3.95/109%, FEV1/FVC: 83%).    Spirometry consistent with normal pattern.   Allergy Studies: none           Malachi Bonds, MD [facility]

## 2023-07-30 NOTE — Patient Instructions (Addendum)
 1. SOB - Lung testing looks excellent today. - I do not think that we need to add anything daily to help with symptoms. - I would like to know whether the albuterol helps.  - Spacer sample and demonstration provided. - Daily controller medication(s): NOTHING - Prior to physical activity: albuterol 2 puffs 10-15 minutes before physical activity. - Rescue medications: albuterol 4 puffs every 4-6 hours as needed - Asthma control goals:  * Full participation in all desired activities (may need albuterol before activity) * Albuterol use two time or less a week on average (not counting use with activity) * Cough interfering with sleep two time or less a month * Oral steroids no more than once a year * No hospitalizations  2. Chronic rhinitis - Because of insurance stipulations, we cannot do skin testing on the same day as your first visit. - We are all working to fight this, but for now we need to do two separate visits.  - We will know more after we do testing at the next visit.  - The skin testing visit can be squeezed in at your convenience.  - Then we can make a more full plan to address all of your symptoms. - Be sure to stop your antihistamines for 3 days before this appointment.  - We will figure out a good regimen for you once we do the testing.    3. Return in about 1 week (around 08/06/2023) for ALLERGY TESTING (1-55). You can have the follow up appointment with Dr. Dellis Anes or a Nurse Practicioner (our Nurse Practitioners are excellent and always have Physician oversight!).    Please inform us of any Emergency Department visits, hospitalizations, or changes in symptoms. Call us before going to the ED for breathing or allergy symptoms since we might be able to fit you in for a sick visit. Feel free to contact us anytime with any questions, problems, or concerns.  It was a pleasure to meet you and your family today!  Websites that have reliable patient information: 1. American  Academy of Asthma, Allergy, and Immunology: www.aaaai.org 2. Food Allergy Research and Education (FARE): foodallergy.org 3. Mothers of Asthmatics: http://www.asthmacommunitynetwork.org 4. American College of Allergy, Asthma, and Immunology: www.acaai.org      "Like" [place] on Facebook and Instagram for our latest updates!      A healthy democracy works best when [employer] participate! Make sure you are registered to vote! If you have moved or changed any of your contact information, you will need to get this updated before voting! Scan the QR codes below to learn more!

## 2023-08-01 NOTE — Progress Notes (Signed)
 38 y.o. G0P0000 Single Caucasian female here for annual exam.  Pt is starting to notice seizures are coming on faster near depo inj. Pt will start estradiol a day early to help alleviate these seizures.  Mother is present for the visit today.   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  Hx brain aneurysm repair.  Hx thyroid cancer.   Seizures are controlled when her temperature is controlled. When she ovulates, her temperature rises and she has seizures.  Took birth control pills from 2007 to 2008.  Then used Nexplanon from 2008 - 2010.  Both did not work.  Depo started 03/2009 and her periods stopped, receiving the Depo every 89 days b injections through her mother.   Patient then developed vaginal atrophy and did treatment with physical therapy with vaginal dilators.   Stopped Depo due to atrophy and started Syla IUD in 2015 which did not control her periods for a long time.   She used Mirena 06/2015 and this did not control her periods.  Depo restarted 07/2017 and this stopped her cycles.  Taking it every 87 days.    Now she is taking it every 67 - 87 days.   When she used oral estrogen in the past, she had hives and a fever.  Fever brings on seizures.   Had some cramping this week and her temperature was increased, and symptoms resolved with used of vaginal estradiol.   Takes Topamax.  Relationship change.   Not sexually active with her new partner.  She declines STD testing.   Would consider childbearing for the future.   PCP: Myrlene Broker, MD   No LMP recorded. Patient has had an injection.           Sexually active: Yes.    The current method of family planning is Depo-Provera injections.    Menopausal hormone therapy:  n/a Exercising: Yes.     Gym, walking Smoker:  no  OB History  Gravida Para Term Preterm AB Living  0 0 0 0 0 0  SAB IAB Ectopic Multiple Live Births  0 0 0 0 0     HEALTH MAINTENANCE: Last 2 paps:   02/23/20 WNL at [facility] History of abnormal Pap or positive HPV:  no Mammogram:   06/11/23 Breast Density Cat B, BI-RADS CAT 3 probably benign.  Due in Jan. 2026 for dx bilateral mammogram and bilateral US. Colonoscopy:  n/a Bone Density:  n/a  Result  n/a   Immunization History  Administered Date(s) Administered   Influenza,inj,Quad PF,6+ Mos 05/02/2021, 02/19/2022   PFIZER(Purple Top)SARS-COV-2 Vaccination 09/01/2019, 09/22/2019, 05/03/2020      reports that she has never smoked. She has never been exposed to tobacco smoke. She has never used smokeless tobacco. She reports current alcohol use. She reports that she does not use drugs.  Past Medical History:  Diagnosis Date   Angio-edema    Depression    Epilepsy (HCC)    Headache    Hyperlipidemia    Hypothyroidism    Short-term memory loss    Caused by seizures   Thyroid cancer [facility])     Past Surgical History:  Procedure Laterality Date   BRAIN SURGERY  05/03/2006   IMPLANTATION VAGAL NERVE STIMULATOR  09/28/2015   IR 3D INDEPENDENT WKST  07/03/2021   IR ANGIO INTRA EXTRACRAN SEL INTERNAL CAROTID BILAT MOD SED  07/03/2021   IR ANGIO VERTEBRAL SEL VERTEBRAL UNI R MOD SED  07/03/2021  IR CT HEAD LTD  07/03/2021   IR RADIOLOGIST EVAL & MGMT  06/12/2021   IR RADIOLOGIST EVAL & MGMT  07/25/2021   IR TRANSCATH/EMBOLIZ  07/03/2021   RADIOLOGY WITH ANESTHESIA N/A 07/03/2021   Procedure: Cerebral angiogram with intent to embolize brain aneurysm;  Surgeon: Julieanne Cotton, MD;  Location: [facility] OR;  Service: Radiology;  Laterality: N/A;   THYROID SURGERY     cancer   VAGUS NERVE STIMULATOR INSERTION Left 09/25/2021   Procedure: Vagus Nerve Stimulator  Battery Change;  Surgeon: Lisbeth Renshaw, MD;  Location: [facility] OR;  Service: Neurosurgery;  Laterality: Left;    Current Outpatient Medications  Medication Sig Dispense Refill   albuterol (VENTOLIN HFA) 108 (90 Base) MCG/ACT inhaler Inhale 2 puffs into the lungs every 6 (six) hours as needed for  wheezing or shortness of breath. 8 g 2   Ascorbic Acid (VITAMIN C) 1000 MG tablet Take 1,000 mg by mouth daily.     aspirin EC 81 MG tablet Take 81 mg by mouth daily. Swallow whole.     b complex vitamins capsule Take 1 capsule by mouth daily.     Biotin 1000 MCG tablet Take 1,000 mcg by mouth daily.     Calcium-Magnesium-Vitamin D (CALCIUM 1200+D3 PO) Take 1 tablet by mouth 2 (two) times daily.     cholecalciferol (VITAMIN D3) 25 MCG (1000 UNIT) tablet Take 1,000 Units by mouth daily.     Estradiol 10 MCG TABS vaginal tablet Place 1 tablet (10 mcg total) vaginally 2 (two) times a week. 24 tablet 0   ibuprofen (ADVIL) 200 MG tablet Take 400 mg by mouth every 6 (six) hours as needed for headache.     levothyroxine (SYNTHROID) 50 MCG tablet Take 1 tablet (50 mcg total) by mouth daily. 90 tablet 3   LORazepam (ATIVAN) 2 MG tablet Take 1 tablet (2 mg total) by mouth daily as needed for seizure. 10 tablet 5   medroxyPROGESTERone Acetate 150 MG/ML SUSY Inject 1 mL (150 mg total) into the muscle every 3 (three) months. 1 mL 3   Melatonin 10 MG TABS Take 10 mg by mouth at bedtime.     Multiple Vitamins-Minerals (MULTIVITAMIN WITH MINERALS) tablet Take 1 tablet by mouth daily.     NON FORMULARY Place 125 mg under the tongue in the morning and at bedtime. CBD oil     Omega-3 Fatty Acids (FISH OIL) 1200 MG CPDR Take 1,200 mg by mouth daily.     topiramate (TOPAMAX) 100 MG tablet Take 1 tablet twice a day 180 tablet 3   vitamin B-12 (CYANOCOBALAMIN) 1000 MCG tablet Take 1,000 mcg by mouth daily.     No current facility-administered medications for this visit.    ALLERGIES: Estrogens, Tape, Amoxicillin, Latex, Metronidazole, and Pregabalin  Family History  Problem Relation Age of Onset   Asthma Mother    Allergic rhinitis Mother    Breast cancer Mother    Cancer Mother    Angioedema Neg Hx    Eczema Neg Hx    Urticaria Neg Hx     Review of Systems  All other systems reviewed and are  negative.   PHYSICAL EXAM:  BP 136/80 (BP Location: Left Arm, Patient Position: Sitting, Cuff Size: Large)   Pulse 81   Ht 5\' 4"  (1.626 m)   Wt 200 lb (90.7 kg)   SpO2 98%   BMI 34.33 kg/m     General appearance: alert, cooperative and appears stated age Head: normocephalic, without obvious  abnormality, atraumatic Neck: no adenopathy, supple, symmetrical, trachea midline and thyroid normal to inspection and palpation Lungs: clear to auscultation bilaterally Breasts: normal appearance, no masses or tenderness, No nipple retraction or dimpling, No nipple discharge or bleeding, No axillary adenopathy Heart: regular rate and rhythm Abdomen: soft, non-tender; no masses, no organomegaly Extremities: extremities normal, atraumatic, no cyanosis or edema Skin: skin color, texture, turgor normal. No rashes or lesions Lymph nodes: cervical, supraclavicular, and axillary nodes normal. Neurologic: grossly normal  Pelvic: External genitalia:  left vulvar skin hemangioma 1 cm, blanches with contact.               No abnormal inguinal nodes palpated.              Urethra:  normal appearing urethra with no masses, tenderness or lesions              Bartholins and Skenes: normal                 Vagina: normal appearing vagina with normal color and discharge, no lesions              Cervix: no lesions              Pap taken: Yes.   Bimanual Exam:  Uterus:  normal size, contour, position, consistency, mobility, non-tender              Adnexa: no mass, fullness, tenderness                Chaperone was present for exam:  Warren Lacy, CMA  ASSESSMENT: Well woman visit with gynecologic exam. 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  Best control of seizures with ovulation suppression and control of body temperature.  Hx brain aneurysm repair.  Hx thyroid cancer.  Long term Depo Provera use, working well to reduce ovulation and potential increased risk of seizures. Contraceptive management.  Vaginal atrophy.   Use of Vagifem twice weekly.  Left vulvar hemangioma.  Cervical cancer screening.  FH breast cancer in mother.  PHQ-9: 0  PLAN: Mammogram screening discussed. Self breast awareness reviewed. Pap and HRV collected:  Yes.   Guidelines for Calcium, Vitamin D, regular exercise program including cardiovascular and weight bearing exercise. Medication refills:   Depo Provera 150 mg IM q 3 months for one year.  We reviewed that this can be given more frequently but this this needs to be reviewed prior by GYN medical prescribing team.  Risk of bone density loss with use of Depo Provera reviewed.  We also reviewed potential increased risk of meningioma of the brain with depo provera use.  This can increase seizures. Patient and mother indicate understanding of this and the need to review with the neurology team and neurosurgery team.  Refill of Vagifem.   We also discussed lubricants with water based products and cooking oils.  Follow up:  yearly and prn.    Additional counseling given.  Yes.  . 68 min  total time was spent for this patient encounter, including preparation, face-to-face counseling with the patient, coordination of care, and documentation of the encounter in addition to doing the well woman visit with gynecologic exam.  This was a visit that involved an extensive consultation for a complex medical visit due to history of seizure disorder, medication to control seizures, contraception, and treatment of vaginal atrophy.

## 2023-08-08 ENCOUNTER — Ambulatory Visit (INDEPENDENT_AMBULATORY_CARE_PROVIDER_SITE_OTHER): Admitting: Allergy & Immunology

## 2023-08-08 ENCOUNTER — Encounter: Payer: Self-pay | Admitting: Allergy & Immunology

## 2023-08-08 DIAGNOSIS — J310 Chronic rhinitis: Secondary | ICD-10-CM

## 2023-08-08 DIAGNOSIS — J709 Respiratory conditions due to unspecified external agent: Secondary | ICD-10-CM

## 2023-08-08 NOTE — Progress Notes (Signed)
 FOLLOW UP  Date of Service/Encounter:  08/08/23   Assessment:   Chronic rhinitis - with negative testing (avoiding intradermals right now since she is doing fine with Flonase)   Shortness of breath - normal spiro    Complicated past medical history including a seizure disorder   Disabled status    Plan/Recommendations:   1. SOB - Lung testing looked excellent at the last visit. - Daily controller medication(s): NOTHING - Prior to physical activity: albuterol 2 puffs 10-15 minutes before physical activity. - Rescue medications: albuterol 4 puffs every 4-6 hours as needed - Asthma control goals:  * Full participation in all desired activities (may need albuterol before activity) * Albuterol use two time or less a week on average (not counting use with activity) * Cough interfering with sleep two time or less a month * Oral steroids no more than once a year * No hospitalizations  2. Chronic rhinitis - Testing today showed: NEGATIVE TO THE ENTIRE PANEL - Copy of test results provided.  - This is good news.  - Continue with: Flonase (fluticasone) one spray per nostril daily (AIM FOR EAR ON EACH SIDE) - We could do intradermal testing which is more sensitive in the future, but we will hold off for now.   3. Return in about one year or earlier if needed.    Subjective:   Sandra Collins is a 38 y.o. female presenting today for follow up of No chief complaint on file.   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  Discussed the use of AI scribe software for clinical note transcription with the patient and/or guardian, who gave verbal consent to proceed.  Sandra Collins is a 38 y.o. female presenting for skin testing. She was last seen on March 4. We could not do testing because her insurance company does not cover testing on the same day as a New Patient visit. She has been off of all antihistamines 3 days in anticipation of the testing.   At that time, lung testing looked great.  She presented with shortness of breath.  We continue with albuterol as needed.  For her rhinitis, we decided to do allergy testing, which brings her in today.  At the time, she had been on loratadine as well as cetirizine.  She never tried any nasal sprays.  Otherwise, there have been no changes to her past medical history, surgical history, family history, or social history.    Review of systems otherwise negative other than that mentioned in the HPI.    Objective:   There were no vitals taken for this visit. There is no height or weight on file to calculate BMI.    Physical exam deferred since this was a skin testing appointment only.   Diagnostic studies:   Allergy Studies:     Airborne Adult Perc - 08/08/23 4132  Time Antigen Placed 6213    Allergen Manufacturer Waynette Buttery    Location Back    Number of Test 55    1. Control-Buffer 50% Glycerol Negative    2. Control-Histamine 3+    3. Bahia Negative    4. French Southern Territories Negative    5. Johnson Negative    6. Kentucky Blue Negative    7. Meadow Fescue Negative    8. Perennial Rye Negative    9. Timothy Negative    10. Ragweed Mix Negative    11. Cocklebur Negative    12. Plantain,  English Negative    13. Baccharis Negative    14. Dog Fennel Negative    15.  Russian Thistle Negative    16. Lamb's Quarters Negative    17. Sheep Sorrell Negative    18. Rough Pigweed Negative    19. Marsh Elder, Rough Negative    20. Mugwort, Common Negative    21. Box, Elder Negative    22. Cedar, red Negative    23. Sweet Gum Negative    24. Pecan Pollen Negative    25. Pine Mix Negative    26. Walnut, Black Pollen Negative    27. Red Mulberry Negative    28. Ash Mix Negative    29. Birch Mix Negative    30. Beech American Negative    31. Cottonwood, Guinea-Bissau Negative    32. Hickory, White Negative    33. Maple Mix Negative    34. Oak, Guinea-[place]iss[place] Mix Negative    35. Sycamore Eastern Negative    36. Alternaria Alternata Negative    37. Cladosporium Herbarum Negative    38. Aspergillus Mix Negative    39. Penicillium Mix Negative    40. Bipolaris Sorokiniana (Helminthosporium) Negative    41. Drechslera Spicifera (Curvularia) Negative    42. Mucor Plumbeus Negative    43. Fusarium Moniliforme Negative    44. Aureobasidium Pullulans (pullulara) Negative    45. Rhizopus Oryzae Negative    46. Botrytis Cinera Negative    47. Epicoccum Nigrum Negative    48. Phoma Betae Negative    49. Dust Mite Mix Negative    50. Cat Hair 10,000 BAU/ml Negative    51.  Dog Epithelia Negative    52. Mixed Feathers Negative    53. Horse Epithelia Negative    54. Cockroach, German Negative    55. Tobacco Leaf Negative             Allergy testing results were read and interpreted by myself, documented by clinical staff.      Malachi Bonds, MD  [facility]

## 2023-08-08 NOTE — Patient Instructions (Signed)
 1. SOB - Lung testing looked excellent at the last visit. - Daily controller medication(s): NOTHING - Prior to physical activity: albuterol 2 puffs 10-15 minutes before physical activity. - Rescue medications: albuterol 4 puffs every 4-6 hours as needed - Asthma control goals:  * Full participation in all desired activities (may need albuterol before activity) * Albuterol use two time or less a week on average (not counting use with activity) * Cough interfering with sleep two time or less a month * Oral steroids no more than once a year * No hospitalizations  2. Chronic rhinitis - Testing today showed: NEGATIVE TO THE ENTIRE PANEL - Copy of test results provided.  - This is good news.  - Continue with: Flonase (fluticasone) one spray per nostril daily (AIM FOR EAR ON EACH SIDE) - We could do intradermal testing which is more sensitive in the future, but we will hold off for now.   3. Return in about one year or earlier if needed.    Please inform us of any Emergency Department visits, hospitalizations, or changes in symptoms. Call us before going to the ED for breathing or allergy symptoms since we might be able to fit you in for a sick visit. Feel free to contact us anytime with any questions, problems, or concerns.  It was a pleasure to see you guys again today!  Websites that have reliable patient information: 1. American Academy of Asthma, Allergy, and Immunology: www.aaaai.org 2. Food Allergy Research and Education (FARE): foodallergy.org 3. Mothers of Asthmatics: http://www.asthmacommunitynetwork.org 4. American College of Allergy, Asthma, and Immunology: www.acaai.org      "Like" [place] on Facebook and Instagram for our latest updates!      A healthy democracy works best when [employer] participate! Make sure you are registered to vote! If you have moved or changed any of your contact information, you will need to get this updated before voting! Scan the QR codes below to  learn more!        Airborne Adult Perc - 08/08/23 0949     Time Antigen Placed 1308    Allergen Manufacturer Waynette Buttery    Location Back    Number of Test 55    1. Control-Buffer 50% Glycerol Negative    2. Control-Histamine 3+    3. Bahia Negative    4. French Southern Territories Negative    5. Johnson Negative    6. Kentucky Blue Negative    7. Meadow Fescue Negative    8. Perennial Rye Negative    9. Timothy Negative    10. Ragweed Mix Negative    11. Cocklebur Negative    12. Plantain,  English Negative    13. Baccharis Negative    14. Dog Fennel Negative    15. Russian Thistle Negative    16. Lamb's Quarters Negative    17. Sheep Sorrell Negative    18. Rough Pigweed Negative    19. Marsh Elder, Rough Negative    20. Mugwort, Common Negative    21. Box, Elder Negative    22. Cedar, red Negative    23. Sweet Gum Negative    24. Pecan Pollen Negative    25. Pine Mix Negative    26. Walnut, Black Pollen Negative    27. Red Mulberry Negative    28. Ash Mix Negative    29. Birch Mix Negative    30. Beech American Negative    31. Cottonwood, Guinea-Bissau Negative    32. Hickory, White Negative  33. Maple Mix Negative    34. Oak, [place]iss[place] Mix Negative    35. Sycamore Eastern Negative    36. Alternaria Alternata Negative    37. Cladosporium Herbarum Negative    38. Aspergillus Mix Negative    39. Penicillium Mix Negative    40. Bipolaris Sorokiniana (Helminthosporium) Negative    41. Drechslera Spicifera (Curvularia) Negative    42. Mucor Plumbeus Negative    43. Fusarium Moniliforme Negative    44. Aureobasidium Pullulans (pullulara) Negative    45. Rhizopus Oryzae Negative    46. Botrytis Cinera Negative    47. Epicoccum Nigrum Negative    48. Phoma Betae Negative    49. Dust Mite Mix Negative    50. Cat Hair 10,000 BAU/ml Negative    51.  Dog Epithelia Negative    52. Mixed Feathers Negative    53. Horse Epithelia Negative    54. Cockroach, German Negative    55. Tobacco Leaf  Negative             Rhinitis (Hayfever) Overview  There are two types of rhinitis: allergic and non-allergic.  Allergic Rhinitis If you have allergic rhinitis, your immune system mistakenly identifies a typically harmless substance as an intruder. This substance is called an allergen. The immune system responds to the allergen by releasing histamine and chemical mediators that typically cause symptoms in the nose, throat, eyes, ears, skin and roof of the mouth.  Seasonal allergic rhinitis (hay fever) is most often caused by pollen carried in the air during different times of the year in different parts of the country.  Allergic rhinitis can also be triggered by common indoor allergens such as the dried skin flakes, urine and saliva found on pet dander, mold, droppings from dust mites and cockroach particles. This is called perennial allergic rhinitis, as symptoms typically occur year-round.  In addition to allergen triggers, symptoms may also occur from irritants such as smoke and strong odors, or to changes in the temperature and humidity of the air. This happens because allergic rhinitis causes inflammation in the nasal lining, which increases sensitivity to inhalants.  Many people with allergic rhinitis are prone to allergic conjunctivitis (eye allergy). In addition, allergic rhinitis can make symptoms of asthma worse for people who suffer from both conditions.  Nonallergic Rhinitis At least one out of three people with rhinitis symptoms do not have allergies. Nonallergic rhinitis usually afflicts adults and causes year-round symptoms, especially runny nose and nasal congestion. This condition differs from allergic rhinitis because the immune system is not involved.

## 2023-08-13 ENCOUNTER — Other Ambulatory Visit (HOSPITAL_COMMUNITY): Payer: Self-pay | Admitting: Interventional Radiology

## 2023-08-13 DIAGNOSIS — I671 Cerebral aneurysm, nonruptured: Secondary | ICD-10-CM

## 2023-08-15 ENCOUNTER — Other Ambulatory Visit (HOSPITAL_COMMUNITY)
Admission: RE | Admit: 2023-08-15 | Discharge: 2023-08-15 | Disposition: A | Discharge status: Home / Self Care | Source: Ambulatory Visit | Attending: Obstetrics and Gynecology | Admitting: Obstetrics and Gynecology

## 2023-08-15 ENCOUNTER — Encounter: Payer: Self-pay | Admitting: Obstetrics and Gynecology

## 2023-08-15 ENCOUNTER — Ambulatory Visit (INDEPENDENT_AMBULATORY_CARE_PROVIDER_SITE_OTHER): Payer: Medicare Other | Admitting: Obstetrics and Gynecology

## 2023-08-15 VITALS — BP 136/80 | HR 81 | Ht 64.0 in | Wt 200.0 lb

## 2023-08-15 DIAGNOSIS — Z1151 Encounter for screening for human papillomavirus (HPV): Secondary | ICD-10-CM | POA: Diagnosis not present

## 2023-08-15 DIAGNOSIS — Z01419 Encounter for gynecological examination (general) (routine) without abnormal findings: Secondary | ICD-10-CM

## 2023-08-15 DIAGNOSIS — Z124 Encounter for screening for malignant neoplasm of cervix: Secondary | ICD-10-CM

## 2023-08-15 DIAGNOSIS — N952 Postmenopausal atrophic vaginitis: Secondary | ICD-10-CM | POA: Diagnosis not present

## 2023-08-15 DIAGNOSIS — G40909 Epilepsy, unspecified, not intractable, without status epilepticus: Secondary | ICD-10-CM | POA: Diagnosis not present

## 2023-08-15 DIAGNOSIS — Z308 Encounter for other contraceptive management: Secondary | ICD-10-CM

## 2023-08-15 DIAGNOSIS — Z1331 Encounter for screening for depression: Secondary | ICD-10-CM

## 2023-08-15 MED ORDER — ESTRADIOL 10 MCG VA TABS: 10.0000 ug | ORAL_TABLET | VAGINAL | 3 refills | Status: DC

## 2023-08-15 MED ORDER — MEDROXYPROGESTERONE ACETATE 150 MG/ML IM SUSY: 1.0000 mL | PREFILLED_SYRINGE | INTRAMUSCULAR | 3 refills | Status: DC

## 2023-08-15 NOTE — Patient Instructions (Signed)
 Meningioma  A meningioma is an abnormal growth of cells (tumor) that occurs in the meninges. The meninges are the thin tissues that cover the brain and spinal cord. A meningioma is usually not cancer (benign). In rare cases, a meningioma is cancerous (malignant). What are the causes? This condition may be caused by: Being exposed to ionizing radiation. This type of energy occurs naturally in the environment. It also comes from artificial sources, such as from X-rays and some medical devices. Neurofibromatosis 2. This is a genetic disorder that causes many soft tumors. Genetic mutations. These are changes in certain genes. Many times, the cause of this condition is not known. What increases the risk? The following factors may make you more likely to develop this condition: Being exposed to radiation, especially as a child. Being female. There may be a greater risk linked to having female hormones. Older people who are female have a higher risk of meningiomas than people who are female or children. People who are female have a higher risk of malignant meningiomas. Being obese. What are the signs or symptoms? Common symptoms of this condition include: Headaches. Nausea and vomiting. Vision changes. Hearing changes. Loss of the sense of smell. Other symptoms include: Seizures. Weakness or numbness on one side of the body, or in an arm or a leg. Problems with memory or thinking. Mood or personality changes. Symptoms of this condition usually begin very slowly. The symptoms may depend on the size and location of your tumor. How is this diagnosed? This condition is diagnosed based on: Results of brain imaging tests, such as a CT scan or an MRI. Removing a tissue sample to look at it under a microscope (biopsy). This may be done to confirm the diagnosis and to help determine the best treatment for your condition. How is this treated? This condition may be treated with: Medicines, such as  steroids. These may help lessen brain swelling and and improve your symptoms. Radiation therapy. This uses high-energy rays to shrink or kill your tumor. Surgery to remove as much of your tumor as possible. You may not have treatment until your symptoms start to affect your daily activities. This is because meningiomas grow very slowly. Your health care provider may prefer to watch for growth of your benign tumor before starting treatment. Follow these instructions at home: Take over-the-counter and prescription medicines only as told by your health care provider. Follow instructions from your health care provider about what you may eat and drink. Drink enough fluid to keep your urine pale yellow. Return to your normal activities as told by your health care provider. Ask your health care provider what activities are safe for you. Keep all follow-up visits. You may need regular visits with your health care provider to watch the growth of your tumor. Contact a health care provider if: You have symptoms that come back after treatment. You have headaches. You vomit. You have changes in your vision. You are weaker or more tired than usual. Get help right away if: You have sudden changes in vision. You have a seizure or you are told you have had a seizure. You have new weakness or numbness on one side of your body. Your vomiting does not go away, or you cannot eat or drink without vomiting. You have trouble walking. This information is not intended to replace advice given to you by your health care provider. Make sure you discuss any questions you have with your health care provider. Document Revised: 09/18/2021 Document Reviewed:  09/18/2021 Elsevier Patient Education  2024 Elsevier Inc.  EXERCISE AND DIET:  We recommended that you start or continue a regular exercise program for good health. Regular exercise means any activity that makes your heart beat faster and makes you sweat.  We recommend  exercising at least 30 minutes per day at least 3 days a week, preferably 4 or 5.  We also recommend a diet low in fat and sugar.  Inactivity, poor dietary choices and obesity can cause diabetes, heart attack, stroke, and kidney damage, among others.    ALCOHOL AND SMOKING:  Women should limit their alcohol intake to no more than 7 drinks/beers/glasses of wine (combined, not each!) per week. Moderation of alcohol intake to this level decreases your risk of breast cancer and liver damage. And of course, no recreational drugs are part of a healthy lifestyle.  And absolutely no smoking or even second hand smoke. Most people know smoking can cause heart and lung diseases, but did you know it also contributes to weakening of your bones? Aging of your skin?  Yellowing of your teeth and nails?  CALCIUM AND VITAMIN D:  Adequate intake of calcium and Vitamin D are recommended.  The recommendations for exact amounts of these supplements seem to change often, but generally speaking 600 mg of calcium (either carbonate or citrate) and 800 units of Vitamin D per day seems prudent. Certain women may benefit from higher intake of Vitamin D.  If you are among these women, your doctor will have told you during your visit.    PAP SMEARS:  Pap smears, to check for cervical cancer or precancers,  have traditionally been done yearly, although recent scientific advances have shown that most women can have pap smears less often.  However, every woman still should have a physical exam from her gynecologist every year. It will include a breast check, inspection of the vulva and vagina to check for abnormal growths or skin changes, a visual exam of the cervix, and then an exam to evaluate the size and shape of the uterus and ovaries.  And after 36 years of age, a rectal exam is indicated to check for rectal cancers. We will also provide age appropriate advice regarding health maintenance, like when you should have certain vaccines,  screening for sexually transmitted diseases, bone density testing, colonoscopy, mammograms, etc.   MAMMOGRAMS:  All women over 28 years old should have a yearly mammogram. Many facilities now offer a "3D" mammogram, which may cost around $50 extra out of pocket. If possible,  we recommend you accept the option to have the 3D mammogram performed.  It both reduces the number of women who will be called back for extra views which then turn out to be normal, and it is better than the routine mammogram at detecting truly abnormal areas.    COLONOSCOPY:  Colonoscopy to screen for colon cancer is recommended for all women at age 54.  We know, you hate the idea of the prep.  We agree, BUT, having colon cancer and not knowing it is worse!!  Colon cancer so often starts as a polyp that can be seen and removed at colonscopy, which can quite literally save your life!  And if your first colonoscopy is normal and you have no family history of colon cancer, most women don't have to have it again for 10 years.  Once every ten years, you can do something that may end up saving your life, right?  We will be happy  to help you get it scheduled when you are ready.  Be sure to check your insurance coverage so you understand how much it will cost.  It may be covered as a preventative service at no cost, but you should check your particular policy.

## 2023-08-16 LAB — CYTOLOGY - PAP
Comment: NEGATIVE
Diagnosis: NEGATIVE
High risk HPV: NEGATIVE

## 2023-08-19 ENCOUNTER — Encounter: Payer: Self-pay | Admitting: Obstetrics and Gynecology

## 2023-08-22 ENCOUNTER — Ambulatory Visit (HOSPITAL_COMMUNITY)
Admission: RE | Admit: 2023-08-22 | Discharge: 2023-08-22 | Disposition: A | Discharge status: Home / Self Care | Source: Ambulatory Visit | Attending: Interventional Radiology | Admitting: Interventional Radiology

## 2023-08-22 DIAGNOSIS — I671 Cerebral aneurysm, nonruptured: Secondary | ICD-10-CM | POA: Insufficient documentation

## 2023-08-22 MED ORDER — IOHEXOL 350 MG/ML SOLN
80.0000 mL | Freq: Once | INTRAVENOUS | Status: AC | PRN
  Administered 2023-08-22: 80 mL via INTRAVENOUS

## 2023-08-22 MED ORDER — SODIUM CHLORIDE (PF) 0.9 % IJ SOLN
INTRAMUSCULAR | Status: AC
Start: 2023-08-22 — End: ?
  Filled 2023-08-22: qty 50

## 2023-08-23 ENCOUNTER — Telehealth: Payer: Self-pay

## 2023-08-23 NOTE — Telephone Encounter (Signed)
 Copied from CRM [id number]. Topic: General - Other >> Aug 23, 2023 12:15 PM Aisha D wrote: Reason for CRM: Patient stated that she had a CT scan done yesterday and thinks she is having an allergic reaction to the contrast. Patient stated that her arm is itching and a little red. Patient declined speaking to nurse and stated that she just wanted make Dr.Crawford aware of this concern for the future.

## 2023-08-27 NOTE — Telephone Encounter (Signed)
 Ok to use benadryl if needed.

## 2023-08-27 NOTE — Telephone Encounter (Signed)
 FYI

## 2023-09-17 ENCOUNTER — Telehealth (HOSPITAL_COMMUNITY): Payer: Self-pay

## 2023-09-17 NOTE — Telephone Encounter (Signed)
Pt agreed to f/u in 2 years with a cta head/neck. AB

## 2023-09-18 ENCOUNTER — Encounter: Payer: Self-pay | Admitting: Family Medicine

## 2023-09-18 ENCOUNTER — Ambulatory Visit (INDEPENDENT_AMBULATORY_CARE_PROVIDER_SITE_OTHER): Admitting: Family Medicine

## 2023-09-18 VITALS — BP 112/78 | HR 112 | Temp 98.5°F | Ht 64.0 in | Wt 201.0 lb

## 2023-09-18 DIAGNOSIS — R051 Acute cough: Secondary | ICD-10-CM | POA: Diagnosis not present

## 2023-09-18 DIAGNOSIS — Z9109 Other allergy status, other than to drugs and biological substances: Secondary | ICD-10-CM

## 2023-09-18 DIAGNOSIS — R509 Fever, unspecified: Secondary | ICD-10-CM | POA: Diagnosis not present

## 2023-09-18 DIAGNOSIS — J029 Acute pharyngitis, unspecified: Secondary | ICD-10-CM

## 2023-09-18 LAB — POC COVID19 BINAXNOW: SARS Coronavirus 2 Ag: NEGATIVE

## 2023-09-18 MED ORDER — AZITHROMYCIN 250 MG PO TABS: ORAL_TABLET | ORAL | 0 refills | Status: AC

## 2023-09-18 NOTE — Progress Notes (Signed)
 Subjective:  Sandra Collins is a 38 y.o. female who presents for a 1 wk hx of low grade fever, headache, ST, rhinorrhea, post nasal drainage, dry cough. States her ST is 10/10 and her neck is sore.   No chills, body aches, chest pain, palpitations, shortness of breath, N/V/D.   Using Claritin, decongestant and Flonase.    ROS as in subjective.   Objective: Vitals:   09/18/23 0840  BP: 112/78  Pulse: (!) 112  Temp: 98.5 F (36.9 C)  SpO2: 99%    General appearance: Alert, WD/WN, no distress, mildly ill appearing                             Skin: warm, no rash                           Head: no sinus tenderness                            Eyes: conjunctiva normal, corneas clear, PERRLA                            Ears: pearly TMs, external ear canals normal                          Nose: septum midline, turbinates swollen, with erythema and clear discharge             Mouth/throat: MMM, tongue normal, mild pharyngeal erythema without edema or exudate                            Neck: supple, bilateral anterior cervical adenopathy and TTP                          Heart: RRR                         Lungs: CTA bilaterally, no wheezes, rales, or rhonchi      Assessment: Acute pharyngitis, unspecified etiology  Acute cough - Plan: POC COVID-19  Fever, unspecified fever cause  Environmental allergies   Plan: Covid negative. Persistent low grade fever with severe sore throat and allergy  symptoms.  Z-pak prescribed in case of strep. Unable to tolerate strep swab today.  Suggested symptomatic OTC remedies. Continue current allergy  medications.  Tylenol  or Ibuprofen OTC prn.   Call/return for worsening symptoms.

## 2023-09-18 NOTE — Patient Instructions (Addendum)
 Take the antibiotic as prescribed.  Use salt water gargles, throat lozenges, ibuprofen and Tylenol  as discussed.  Continue current allergy  medications.   Follow-up if you are getting worse.

## 2023-11-19 ENCOUNTER — Encounter: Payer: Self-pay | Admitting: Neurology

## 2023-11-19 ENCOUNTER — Ambulatory Visit (INDEPENDENT_AMBULATORY_CARE_PROVIDER_SITE_OTHER): Payer: Self-pay | Admitting: Neurology

## 2023-11-19 VITALS — BP 125/82 | HR 93 | Ht 63.0 in | Wt 202.8 lb

## 2023-11-19 DIAGNOSIS — G40219 Localization-related (focal) (partial) symptomatic epilepsy and epileptic syndromes with complex partial seizures, intractable, without status epilepticus: Secondary | ICD-10-CM

## 2023-11-19 MED ORDER — TOPIRAMATE 100 MG PO TABS: ORAL_TABLET | ORAL | 3 refills | Status: DC

## 2023-11-19 NOTE — Patient Instructions (Signed)
 Always a pleasure seeing you!  Reduce Topiramate  100mg : take 1/2 tablet in AM, 1 tablet in PM. Please let me know if any issues as we reduce dose  2. Continue all your other medications  3. The new medication is called Xcopri (Cenobamate). Let me know if you would like to start it before our next appointment, otherwise we can plan to start after we discuss further on next visit  4. Continue seizure diary. Follow-up in 3 months,call for any changes   Seizure Precautions: 1. If medication has been prescribed for you to prevent seizures, take it exactly as directed.  Do not stop taking the medicine without talking to your doctor first, even if you have not had a seizure in a long time.   2. Avoid activities in which a seizure would cause danger to yourself or to others.  Don't operate dangerous machinery, swim alone, or climb in high or dangerous places, such as on ladders, roofs, or girders.  Do not drive unless your doctor says you may.  3. If you have any warning that you may have a seizure, lay down in a safe place where you can't hurt yourself.    4.  No driving for 6 months from last seizure, as per [place]  state law.   Please refer to the following link on the Epilepsy Foundation of America's website for more information: http://www.epilepsyfoundation.org/answerplace/Social/driving/drivingu.cfm   5.  Maintain good sleep hygiene.   6.  Notify your neurology if you are planning pregnancy or if you become pregnant.  7.  Contact your doctor if you have any problems that may be related to the medicine you are taking.  8.  Call 911 and bring the patient back to the ED if:        A.  The seizure lasts longer than 5 minutes.       B.  The patient doesn't awaken shortly after the seizure  C.  The patient has new problems such as difficulty seeing, speaking or moving  D.  The patient was injured during the seizure  E.  The patient has a temperature over 102 F (39C)  F.  The patient  vomited and now is having trouble breathing

## 2023-11-19 NOTE — Progress Notes (Signed)
 NEUROLOGY FOLLOW UP OFFICE NOTE  Sandra Collins [id number] April 30, 1986  HISTORY OF PRESENT ILLNESS: I had the pleasure of seeing Sandra Collins in follow-up in the neurology clinic on 11/19/2023.  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 She is again accompanied by her mother who helps supplement the history today.  Records and images were personally reviewed where available.  We reviewed her seizure calendar. They report that seizures have been consistently occurring on month 3 of her Depo-Provera  injections. She had them on 3/14 (next Depo shot was 3/25), 4/15 (temp 100), then most recently on 6/22 when she had a nocturnal seizure at 5am where her mother heard her with the seizure breathing. She had not taken the estradiol  stick the night prior, temp was 99, she used the stick, took Ibuprofen and an Ativan , and symptoms resolved. Her mother reports that as soon as she gets a little bit of estrogen, she is fine. They continue to work with her gynecologist on regulating her hormones, when she has an elevated temperature, seizures consistently tend to occur. She checks her temperature with an oral thermometer. She has found a Tommie Copper ice cold cap that she wears when walking the dogs to keep her temperature normal, and it has been life-changing for her. She has a VNS and takes Topiramate  100mg  BID and CBD 25mg  dose (obtained from [facility] pharmacy). Her mother is concerned Topiramate  is causing cognitive decline, she has difficulty with measuring cups when cooking. She continues to have word-finding difficulty and has been told she needs to enunciate her words better.   She had a follow-up CTA with Dr. 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 Records from Dr. Janit and provided by her mother were reviewed. Seizures started at 70 months of age (10/1986). She was started on Depakote in 07/1989. She underwent EMU monitoring in [place] in 2007 and had left temporal lobe surgery in [place], [place] that was unsuccessful. VNS was placed in 2017. Main trigger noted for her seizures is a temperature of 99 degrees, whether from illness, physical activity, stress, or menstrual period. She started a CBD schedule in 2020 with her neurologist in [place]  and they were able to wean off Lamotrigine in 2021. She continues on Topiramate  200mg  BID and CBD 70mg  BID. CBD is obtained from [facility] in [place] .  Seizure semiology has included staring with bilateral hand automatisms followed by generalized tonic posturing, and drop attacks. She will have an aura of grabbing on to something then has loss of consciousness. Her mother notes shakiness and pupils dilate. On her last visit with Dr. Janit in 08/2020, they reported her last seizure with loss of consciousness/GTC was in 12/2019, however today report that she had 4 seizures last year with spacing out or behavioral arrest for 15 seconds. She had a seizure yesterday in her sleep occurring on and off from 2:30am to 6am. She almost fell off the bed. She was biting her pillows and teddy bear trying to stay calm. She could feel her body shaking a little bit for 15-30 seconds, occurring repeatedly and waking her up. She did have a low grade temperature and took Ibuprofen and 2mg  lorazepam , with no  further events for the rest of the day. She denies any loss of consciousness with the recent seizures. She has rare body jerks. She denies any olfactory/gustatory hallucinations, focal numbness/tingling/weakness. She has rare migraines and takes Ibuprofen with good response. She has some swallowing difficulties due to her  thyroid  issues, and back pain due to scoliosis. No bowel/bladder dysfunction. Sleep is good. Mood is alright. She manages her own medications without issues.She lives with her mother and seizure dog. She does not drive.   Epilepsy Risk Factors:  A paternal aunt had epilepsy in childhood, 2 paternal cousins have epilepsy. She had a febrile convulsion in childhood. Otherwise she had a normal birth with no history of CNS infections such as meningitis/encephalitis, significant traumatic brain injury, neurosurgical procedures  Prior ASMs: Lamotrigine, Sabril, Dilantin, Trileptal, Lyrica, Onfi, Depakote, Zarontin, Felbatol, Neurontin, Tegretol, Phenobarbital  PAST MEDICAL HISTORY: Past Medical History:  Diagnosis Date   Angio-edema    Depression    Epilepsy (HCC)    Headache    Hyperlipidemia    Hypothyroidism    Short-term memory loss    Caused by seizures   Thyroid  cancer (HCC)     MEDICATIONS: Current Outpatient Medications on File Prior to Visit  Medication Sig Dispense Refill   Ascorbic Acid (VITAMIN C) 1000 MG tablet Take 1,000 mg by mouth daily.     aspirin  EC 81 MG tablet Take 81 mg by mouth daily. Swallow whole.     b complex vitamins capsule Take 1 capsule by mouth daily.     Biotin 1000 MCG tablet Take 1,000 mcg by mouth daily.     Calcium-Magnesium-Vitamin D  (CALCIUM 1200+D3 PO) Take 1 tablet by mouth 2 (two) times daily.     cholecalciferol (VITAMIN D3) 25 MCG (1000 UNIT) tablet Take 1,000 Units by mouth daily.     Estradiol  10 MCG TABS vaginal tablet Place 1 tablet (10 mcg total) vaginally 2 (two) times a week. 24 tablet 3   ibuprofen (ADVIL) 200 MG tablet Take 400 mg by mouth every 6 (six) hours as needed for headache.     levothyroxine  (SYNTHROID ) 50 MCG tablet Take 1 tablet (50 mcg total) by mouth daily. 90 tablet 3   LORazepam  (ATIVAN ) 2 MG tablet Take 1 tablet (2 mg total) by mouth daily as needed for seizure. 10 tablet 5   medroxyPROGESTERone  Acetate 150 MG/ML SUSY  Inject 1 mL (150 mg total) into the muscle every 3 (three) months. 1 mL 3   Melatonin 10 MG TABS Take 10 mg by mouth at bedtime.     Multiple Vitamins-Minerals (MULTIVITAMIN WITH MINERALS) tablet Take 1 tablet by mouth daily.     NON FORMULARY Place 125 mg under the tongue in the morning and at bedtime. CBD oil     Omega-3 Fatty Acids (FISH OIL) 1200 MG CPDR Take 1,200 mg by mouth daily.     topiramate  (TOPAMAX ) 100 MG tablet Take 1 tablet twice a day 180 tablet 3   vitamin B-12 (CYANOCOBALAMIN) 1000 MCG tablet Take 1,000 mcg by mouth daily.     No current facility-administered medications on file prior to visit.    ALLERGIES: Allergies  Allergen Reactions   Estrogens Other (See Comments)    Seizures   Tape Hives, Itching, Swelling and Rash    Must use paper tape.    Amoxicillin Hives   Latex Hives   Metronidazole Hives   Pregabalin Other (See Comments)    Excessive weight gain      FAMILY HISTORY:  Family History  Problem Relation Age of Onset   Asthma Mother    Allergic rhinitis Mother    Breast cancer Mother    Cancer Mother    Angioedema Neg Hx    Eczema Neg Hx    Urticaria Neg Hx     SOCIAL HISTORY: Social History   Socioeconomic History   Marital status: Single    Spouse name: Not on file   Number of children: 0   Years of education: 12   Highest education level: 12th grade  Occupational History   Not on file  Tobacco Use   Smoking status: Never    Passive exposure: Never   Smokeless tobacco: Never  Vaping Use   Vaping status: Never Used  Substance and Sexual Activity   Alcohol use: Yes    Comment: rare   Drug use: Never   Sexual activity: Yes    Birth control/protection: Injection  Other Topics Concern   Not on file  Social History Narrative   Right handed   Lives with mother in a one story townhome   Caffeine one a day   2 dogs   Social Drivers of Corporate investment banker Strain: Low Risk  (06/30/2023)   Overall Financial Resource  Strain (CARDIA)    Difficulty of Paying Living Expenses: Not hard at all  Food Insecurity: No Food Insecurity (06/30/2023)   Hunger Vital Sign    Worried About Running Out of Food in the Last Year: Never true    Ran Out of Food in the Last Year: Never true  Transportation Needs: No Transportation Needs (06/30/2023)   PRAPARE - Administrator, Civil Service (Medical): No    Lack of Transportation (Non-Medical): No  Physical Activity: Insufficiently Active (06/30/2023)   Exercise Vital Sign    Days of Exercise per Week: 3 days    Minutes of Exercise per Session: 30 min  Stress: Patient Declined (06/30/2023)   [employer] of Occupational Health - Occupational Stress Questionnaire    Feeling of Stress : Patient declined  Social Connections: Moderately Integrated (06/30/2023)   Social Connection and Isolation Panel    Frequency of Communication with Friends and Family: More than three times a week    Frequency of Social Gatherings with Friends and Family: Patient declined    Attends Religious Services: More than 4 times per year    Active Member of [employer] or Organizations: Yes    Attends Banker Meetings: 1 to 4 times per year    Marital Status: Never married  Intimate Partner Violence: Not At Risk (03/22/2022)   Humiliation, Afraid, Rape, and Kick questionnaire    Fear of Current or Ex-Partner: No    Emotionally Abused: No    Physically Abused: No    Sexually Abused: No     PHYSICAL EXAM: Vitals:   11/19/23 0828  BP: 125/82  Pulse: 93  SpO2: 99%   General: No acute distress Head:  Normocephalic/atraumatic Skin/Extremities: No rash, no edema Neurological Exam: alert and awake. No aphasia or dysarthria. Fund of knowledge is appropriate.  Attention and concentration are normal.   Cranial nerves: Pupils equal, round. Extraocular movements intact with no nystagmus. Visual fields full.  No facial asymmetry.  Motor: Bulk and tone normal, muscle strength 5/5  throughout with no pronator drift.   Finger to nose testing intact.  Gait narrow-based and steady, no ataxia. No tremors.   IMPRESSION: This is a 38 yo RH woman with intractable epilepsy  s/p left temporal lobe surgery and VNS placement. MRI brain did not show any significant intracranial abnormalities. 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 Seizure diary reviewed, seizures consistently occur as she is close to next Depo-Provera  dose, they will speak to her gynecologist about her estradiol  use to further regulate. They have discussed and are aware of meningioma risk with prolonged Depo-Provera  use. She is reporting more cognitive changes and would like to wean off Topiramate , we discussed adding on Xcopri, side effects discussed. They will look into Xcopri, in the meantime we will reduce Topiramate  100mg : Take 1/2 tablet in AM, 1 tablet in PM. She continues on CBD obtained from [facility] in [facility]. She does not drive. Continue seizure calendar, follow-up in 3 months,call for any changes.    Thank you for allowing me to participate in her care.  Please do not hesitate to call for any questions or concerns.    Darice Shivers, M.D.   CC: Dr. Rollene

## 2023-11-22 ENCOUNTER — Encounter (HOSPITAL_COMMUNITY): Payer: Self-pay | Admitting: Interventional Radiology

## 2023-12-10 ENCOUNTER — Other Ambulatory Visit: Payer: Self-pay | Admitting: Internal Medicine

## 2024-01-02 ENCOUNTER — Telehealth: Payer: Self-pay | Admitting: Neurology

## 2024-01-02 NOTE — Telephone Encounter (Signed)
 Left a message with the after hour service on 01-02-24   Caller states that she wants to speak with someone about the VNS treatment as alternative to the RX Dr Georjean talked with her about this   She has done some research on this and wants to know whether Dr Georjean would be open to her trying it as alternative

## 2024-01-03 NOTE — Telephone Encounter (Signed)
 Spoke to pt --stated Dr Georjean talk about Rx Xcorpri, but would to try another alternative VNS treatment. Please advise

## 2024-01-08 NOTE — Telephone Encounter (Signed)
 It is also an option, we can discuss further on her f/u in a few weeks, thanks

## 2024-01-09 NOTE — Telephone Encounter (Signed)
 Pt been notified message below and voiced understanding.

## 2024-01-28 ENCOUNTER — Encounter: Payer: Self-pay | Admitting: Neurology

## 2024-01-28 ENCOUNTER — Ambulatory Visit (INDEPENDENT_AMBULATORY_CARE_PROVIDER_SITE_OTHER): Admitting: Neurology

## 2024-01-28 VITALS — BP 117/82 | HR 90 | Ht 63.0 in | Wt 202.0 lb

## 2024-01-28 DIAGNOSIS — G40219 Localization-related (focal) (partial) symptomatic epilepsy and epileptic syndromes with complex partial seizures, intractable, without status epilepticus: Secondary | ICD-10-CM | POA: Diagnosis not present

## 2024-01-28 MED ORDER — TOPIRAMATE 100 MG PO TABS: ORAL_TABLET | ORAL | 3 refills | Status: DC

## 2024-01-28 NOTE — Patient Instructions (Signed)
 Good to see you. I wish both of you all the best!! Continue Topiramate  100mg  every night and CBD. Follow-up in 3 months, call for any changes.    Seizure Precautions: 1. If medication has been prescribed for you to prevent seizures, take it exactly as directed.  Do not stop taking the medicine without talking to your doctor first, even if you have not had a seizure in a long time.   2. Avoid activities in which a seizure would cause danger to yourself or to others.  Don't operate dangerous machinery, swim alone, or climb in high or dangerous places, such as on ladders, roofs, or girders.  Do not drive unless your doctor says you may.  3. If you have any warning that you may have a seizure, lay down in a safe place where you can't hurt yourself.    4.  No driving for 6 months from last seizure, as per [place]  state law.   Please refer to the following link on the Epilepsy Foundation of America's website for more information: http://www.epilepsyfoundation.org/answerplace/Social/driving/drivingu.cfm   5.  Maintain good sleep hygiene.  6.  Notify your neurology if you are planning pregnancy or if you become pregnant.  7.  Contact your doctor if you have any problems that may be related to the medicine you are taking.  8.  Call 911 and bring the patient back to the ED if:        A.  The seizure lasts longer than 5 minutes.       B.  The patient doesn't awaken shortly after the seizure  C.  The patient has new problems such as difficulty seeing, speaking or moving  D.  The patient was injured during the seizure  E.  The patient has a temperature over 102 F (39C)  F.  The patient vomited and now is having trouble breathing

## 2024-01-28 NOTE — Progress Notes (Signed)
 NEUROLOGY FOLLOW UP OFFICE NOTE  S[facility] [id number] 1985-07-25  HISTORY OF PRESENT ILLNESS: I had the pleasure of seeing [facility] in follow-up in the neurology clinic on 01/28/2024.  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 She is again accompanied by her mother who helps supplement the history today.  Records and images were personally reviewed where available.  Since her last visit, she reports having seizure warning signs all day on 8/3, no loss of consciousness. That day, she was also confused with which measuring cup to use, which is unusual for her. Her mother instructed her to stop the morning Topiramate  50mg  dose, she continues on the 100mg  at bedtime dose. She took lorazepam  that night. Since stopping the morning dose, they both are happy to report she is cognitively better, she can cook again without any confusion. She had one migraine that went away after wearing the copper cap. She is still dealing with cramps and temperature fluctuations before her next Depo shot and continues to work with her gynecologist. She continues on the same dose of CBD 25mg  (obtained from [facility]) and has her VNS, they have used her magnet.   Her mother has a big surgery on 9/15, [place] has been handling the changes and stress pretty well. She has had her service dog [place] and he has been very helpful. She has been reading more on RNS and may be interested in it once her mother's health situation quiets down.    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 Records from Dr. Janit and provided by her mother were reviewed. Seizures started at 52 months of age (10/1988). She was started on Depakote in 07/1989. She underwent EMU monitoring in [place] in 2007 and had [place]  temporal lobe [place] that was unsuccessful. VNS was placed in 2017. Main trigger noted for her seizures is a temperature of 99 degrees, whether from illness, physical activity, stress, or menstrual period. She started a CBD schedule in 2020 with her neurologist in [place]  and they were able to wean off Lamotrigine in 2021. She continues on Topiramate  200mg  BID and CBD 70mg  BID. CBD is obtained from [facility] pharmac[facility] in [place] .  Seizure semiology has included staring with bilateral hand automatisms followed by generalized tonic posturing, and drop attacks. She will have an aura of grabbing on to something then has loss of consciousness. Her mother notes shakiness and pupils dilate. On her last visit with Dr. Janit in 08/2020, they reported her last seizure with loss of consciousness/GTC was in 12/2019, however today report that she had 4 seizures last year with spacing out or behavioral arrest for 15 seconds. She had a seizure yesterday in her sleep occurring on and off from 2:30am to 6am. She almost fell off the bed. She was biting her pillows and teddy bear trying to stay calm. She could feel her body shaking a little bit for 15-30 seconds, occurring repeatedly and waking her up. She did have a low grade temperature and took Ibuprofen and 2mg  lorazepam , with no further events for the rest of the day. She denies any loss of consciousness with the recent seizures. She has rare body jerks. She denies any olfactory/gustatory hallucinations, focal numbness/tingling/weakness. She has rare migraines and takes Ibuprofen with good response. She  has some swallowing difficulties due to her thyroid  issues, and back pain due to scoliosis. No bowel/bladder dysfunction. Sleep is good. Mood is alright. She manages her own medications without issues.She lives with her mother and seizure dog. She does not drive.   Epilepsy Risk Factors:  A paternal aunt had epilepsy in childhood, 2 paternal cousins have  epilepsy. She had a febrile convulsion in childhood. Otherwise she had a normal birth with no history of CNS infections such as meningitis/encephalitis, significant traumatic brain injury, neurosurgical procedures  Prior ASMs: Lamotrigine, Sabril, Dilantin, Trileptal, Lyrica, Onfi, Depakote, Zarontin, Felbatol, Neurontin, Tegretol, Phenobarbital  Diagnostic Data: Notes from Dr. Kathrine [facility] at [place]: During her video EEG evaluation at [facility] in 2007, recorded intermittent slow activity arising from the left temporal region as well as sharp waev arising from the same area (90 percent of discharges) and the right temporal region (10 percent of discharges). We recorded two seizures arising from the left temporal lobe and one seizure arising from the right temporal lobe. MRI brain in 2007 normal, PET scan reported as normal. She underwent depth electrode evaluation at [facility] in [place], implanted for approximately a month and removed on January 2008. She did not have any seizures.    PAST MEDICAL HISTORY: Past Medical History:  Diagnosis Date   Angio-edema    Depression    Epilepsy (HCC)    Headache    Hyperlipidemia    Hypothyroidism    Short-term memory loss    Caused by seizures   Thyroid  cancer (HCC)     MEDICATIONS: Current Outpatient Medications on File Prior to Visit  Medication Sig Dispense Refill   Ascorbic Acid (VITAMIN C) 1000 MG tablet Take 1,000 mg by mouth daily.     aspirin  EC 81 MG tablet Take 81 mg by mouth daily. Swallow whole.     b complex vitamins capsule Take 1 capsule by mouth daily.     Calcium-Magnesium-Vitamin D  (CALCIUM 1200+D3 PO) Take 1 tablet by mouth 2 (two) times daily.     cholecalciferol (VITAMIN D3) 25 MCG (1000 UNIT) tablet Take 1,000 Units by mouth daily.     Estradiol  10 MCG TABS vaginal tablet Place 1 tablet (10 mcg total) vaginally 2 (two) times a week. 24 tablet 3   ibuprofen (ADVIL) 200 MG tablet Take  400 mg by mouth every 6 (six) hours as needed for headache.     levothyroxine  (SYNTHROID ) 50 MCG tablet TAKE 1 TABLET(50 MCG) BY MOUTH DAILY 90 tablet 1   LORazepam  (ATIVAN ) 2 MG tablet Take 1 tablet (2 mg total) by mouth daily as needed for seizure. 10 tablet 5   medroxyPROGESTERone  Acetate 150 MG/ML SUSY Inject 1 mL (150 mg total) into the muscle every 3 (three) months. 1 mL 3   Melatonin 10 MG TABS Take 10 mg by mouth at bedtime.     Multiple Vitamins-Minerals (MULTIVITAMIN WITH MINERALS) tablet Take 1 tablet by mouth daily.     NON FORMULARY Place 25 mg under the tongue daily. CBD oil     Omega-3 Fatty Acids (FISH OIL) 1200 MG CPDR Take 1,200 mg by mouth daily.     topiramate  (TOPAMAX ) 100 MG tablet Take 1/2 tablet in AM, 1 tablet in PM 135 tablet 3   vitamin B-12 (CYANOCOBALAMIN) 1000 MCG tablet Take 1,000 mcg by mouth daily.     No current facility-administered medications on file prior to visit.    ALLERGIES: Allergies  Allergen Reactions   Estrogens  Other (See Comments)    Seizures   Tape Hives, Itching, Swelling and Rash    Must use paper tape.    Amoxicillin Hives   Latex Hives   Metronidazole Hives   Pregabalin Other (See Comments)    Excessive weight gain      FAMILY HISTORY: Family History  Problem Relation Age of Onset   Asthma Mother    Allergic rhinitis Mother    Breast cancer Mother    Cancer Mother    Angioedema Neg Hx    Eczema Neg Hx    Urticaria Neg Hx     SOCIAL HISTORY: Social History   Socioeconomic History   Marital status: Single    Spouse name: Not on file   Number of children: 0   Collins of education: 12   Highest education level: 12th grade  Occupational History   Not on file  Tobacco Use   Smoking status: Never    Passive exposure: Never   Smokeless tobacco: Never  Vaping Use   Vaping status: Never Used  Substance and Sexual Activity   Alcohol use: Yes    Comment: rare   Drug use: Never   Sexual activity: Yes    Birth  control/protection: Injection  Other Topics Concern   Not on file  Social History Narrative   Right handed   Lives with mother in a one story townhome   Caffeine one a day   2 dogs   Social Drivers of Corporate investment banker Strain: Low Risk  (06/30/2023)   Overall Financial Resource Strain (CARDIA)    Difficulty of Paying Living Expenses: Not hard at all  Food Insecurity: No Food Insecurity (06/30/2023)   Hunger Vital Sign    Worried About Running Out of Food in the Last Year: Never true    Ran Out of Food in the Last Year: Never true  Transportation Needs: No Transportation Needs (06/30/2023)   PRAPARE - Administrator, Civil Service (Medical): No    Lack of Transportation (Non-Medical): No  Physical Activity: Insufficiently Active (06/30/2023)   Exercise Vital Sign    Days of Exercise per Week: 3 days    Minutes of Exercise per Session: 30 min  Stress: Patient Declined (06/30/2023)   [employer] of Occupational Health - Occupational Stress Questionnaire    Feeling of Stress : Patient declined  Social Connections: Moderately Integrated (06/30/2023)   Social Connection and Isolation Panel    Frequency of Communication with Friends and Family: More than three times a week    Frequency of Social Gatherings with Friends and Family: Patient declined    Attends Religious Services: More than 4 times per year    Active Member of [employer] or Organizations: Yes    Attends Banker Meetings: 1 to 4 times per year    Marital Status: Never married  Intimate Partner Violence: Not At Risk (03/22/2022)   Humiliation, Afraid, Rape, and Kick questionnaire    Fear of Current or Ex-Partner: No    Emotionally Abused: No    Physically Abused: No    Sexually Abused: No     PHYSICAL EXAM: Vitals:   01/28/24 0933  BP: 117/82  Pulse: 90  SpO2: 98%   General: No acute distress Head:  Normocephalic/atraumatic Skin/Extremities: No rash, no edema Neurological Exam: alert  and awake. No aphasia or dysarthria. Fund of knowledge is appropriate. Attention and concentration are normal. Cranial nerves: Pupils equal, round. Extraocular movements intact with no  nystagmus. Visual fields full.  No facial asymmetry.  Motor: Bulk and tone normal, muscle strength 5/5 throughout with no pronator drift.   Finger to nose testing intact.  Gait narrow-based and steady, no ataxia. No tremors.   VNS Therapy Management: Parameters Output Current (mA): 2 Signal Frequency (Hz): 25 Pulse Width (usec): 500 Signal ON Time (sec): 30 Signal OFF Time (min): 5 Magnet Output Current (mA): 2.5 Magnet ON Time (sec): 60 Magnet Pulse Width (usec): 250 AutoStim Output Current (mA): 2.25 AutoStim Pulse Width (usec): 250 AutoStim ON Time (sec): 60 Tachycardia Detection : On Heartbeat Detection Sensitivity: 3 Perform Verify Heartbeat Detection: yes Threshold for AutoStim (%): 20% Diagnostics Current Delievered (mA): 2 Impedence Value (Ohms): 2784 Battery Status Indicator (color): Green (50-75%)   IMPRESSION: This is a 38 yo RH woman with 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 MRI brain did not show any significant intracranial abnormalities. 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 She has overall been doing well with only one bout of seizure warning signs on 8/3. The cognitive changes have improved with dose reduction of Topiramate  to 100mg  at bedtime. She continues on CBD obtained from [facility] in [place]. VNS interrogated today, battery 50-75%, no changes made. She is interested in looking into RNS but would like to hold off on referral to tertiary [facility] for now with her mother's upcoming surgery. She does not drive. Continue seizure diary, follow-up in 3 months, call for any changes.   Thank you for allowing me to participate in her care.  Please do not hesitate to call for any questions or  concerns.    Darice Shivers, M.D.   CC: Dr. Rollene

## 2024-02-12 ENCOUNTER — Telehealth: Payer: Self-pay | Admitting: Radiology

## 2024-02-12 NOTE — Telephone Encounter (Signed)
 Copied from CRM [id number]. Topic: General - Other >> Feb 12, 2024  1:55 PM Harlene ORN wrote: Reason for CRM: [place] - [employer] on behalf of [employer] for a chart retrieval for the patient and the Practice.  Phone: [phone]

## 2024-02-27 NOTE — Telephone Encounter (Unsigned)
 Copied from CRM #[id number]. Topic: General - Inquiry >> Feb 27, 2024 12:34 PM Rea C wrote: Reason for CRM:  [place] w/ [employer] on behalf of [employer]. Sent out provider package on sept 25th and wanted to confirm if it was received for the patient. Telma could not provider all three identifiers. Only patients name and birthday 04-Feb-1986.  Alexis contact: [phone]

## 2024-02-28 NOTE — Telephone Encounter (Signed)
This has not been received

## 2024-03-03 NOTE — Telephone Encounter (Signed)
 This still have not been received. I have called and ask them to resend this information I believe that this may be a scam

## 2024-03-18 ENCOUNTER — Ambulatory Visit: Payer: Self-pay

## 2024-03-18 NOTE — Telephone Encounter (Signed)
 FYI Only or Action Required?: FYI only for provider.  Patient was last seen in primary care on 09/18/2023 by Lendia Boby CROME, NP-C.  Called Nurse Triage reporting Shoulder Pain.  Symptoms began several months ago.  Symptoms are: gradually worsening.  Triage Disposition: See PCP Within 2 Weeks  Patient/caregiver understands and will follow disposition?: Yes       Copied from CRM [id number]. Topic: Clinical - Red Word Triage >> Mar 18, 2024 10:03 AM [name] wrote: Red Word that prompted transfer to Nurse Triage: Patient is in pain in her left shoulder/chest since dog pulled on her shoulder. She stated it hurts when she breaths sometimes.        Reason for Disposition  Shoulder pain is a chronic symptom (recurrent or ongoing AND present > 4 weeks)  Answer Assessment - Initial Assessment Questions 1. ONSET: When did the pain start?     5 months ago  2. LOCATION: Where is the pain located?     Left shoulder  3. PAIN: How bad is the pain? (Scale 1-10; or mild, moderate, severe)     Moderate  4. WORK OR EXERCISE: Has there been any recent work or exercise that involved this part of the body?     No 5. CAUSE: What do you think is causing the shoulder pain?     Patient had her arm pulled by a 100 lb dog. 6. OTHER SYMPTOMS: Do you have any other symptoms? (e.g., neck pain, swelling, rash, fever, numbness, weakness)     Left sided chest pain that has been present since the injury.  7. PREGNANCY: Is there any chance you are pregnant? When was your last menstrual period?     No  Protocols used: Shoulder Pain-A-AH

## 2024-03-27 ENCOUNTER — Ambulatory Visit: Admitting: Internal Medicine

## 2024-03-27 ENCOUNTER — Encounter: Payer: Self-pay | Admitting: Internal Medicine

## 2024-03-27 VITALS — BP 130/90 | HR 87 | Temp 98.5°F | Ht 63.0 in | Wt 205.8 lb

## 2024-03-27 DIAGNOSIS — M25512 Pain in left shoulder: Secondary | ICD-10-CM

## 2024-03-27 DIAGNOSIS — E039 Hypothyroidism, unspecified: Secondary | ICD-10-CM | POA: Diagnosis not present

## 2024-03-27 DIAGNOSIS — G8929 Other chronic pain: Secondary | ICD-10-CM | POA: Diagnosis not present

## 2024-03-27 MED ORDER — LEVOTHYROXINE SODIUM 50 MCG PO TABS: 50.0000 ug | ORAL_TABLET | Freq: Every day | ORAL | 3 refills | Status: AC

## 2024-03-27 NOTE — Progress Notes (Signed)
 Subjective:   Patient ID: [facility], female    DOB: [birth year], 38 y.o.   MRN: [id number]  Discussed the use of AI scribe software for clinical note transcription with the patient, who gave verbal consent to proceed.  History of Present Illness [facility] is a 38 year old female who presents with persistent shoulder and chest pain following an injury.  On May 30th, she experienced a significant pull on her shoulder when a 100-pound dog leaped, causing soreness initially thought to be a muscle strain. The pain has persisted and worsened over time.  On June 9th, she developed an open wound on her chest, limiting her ability to perform daily activities. Despite assistance from her mother, she continues to experience pain, particularly when lifting objects, such as a 35-pound case, which causes severe pain in her shoulder and chest area.  The pain is localized to the chest and shoulder area, without radiation down the arm. No numbness or tingling in the hand or arm, but significant pain occurs with certain movements, such as lifting her arm to shave or reaching behind her back. She also has difficulty sleeping on her left side due to pain.  She has had to stop going to the gym due to the pain, impacting her ability to maintain her usual physical activity. She can lift a five-pound weight but experiences pain when attempting to lift it higher or in certain directions.  During the review of symptoms, the pain is most severe in the area where her VNS is located, with a pain level of ten out of ten. Soreness to touch in the arm and shoulder area, with varying levels of pain intensity.  Review of Systems  Constitutional: Negative.   HENT: Negative.    Eyes: Negative.   Respiratory:  Negative for cough, chest tightness and shortness of breath.   Cardiovascular:  Negative for chest pain, palpitations and leg swelling.  Gastrointestinal:  Negative for abdominal distention, abdominal  pain, constipation, diarrhea, nausea and vomiting.  Musculoskeletal:  Positive for arthralgias and myalgias.  Skin: Negative.   Neurological: Negative.   Psychiatric/Behavioral: Negative.      Objective:  Physical Exam Constitutional:      Appearance: She is well-developed.  HENT:     Head: Normocephalic and atraumatic.  Cardiovascular:     Rate and Rhythm: Normal rate and regular rhythm.  Pulmonary:     Effort: Pulmonary effort is normal. No respiratory distress.     Breath sounds: Normal breath sounds. No wheezing or rales.  Abdominal:     General: Bowel sounds are normal. There is no distension.     Palpations: Abdomen is soft.     Tenderness: There is no abdominal tenderness.  Musculoskeletal:        General: Tenderness present.     Cervical back: Normal range of motion.     Comments: 10/10 pain at the left [facility] joint and region, 5/10 at the left biceps region, 7/10 scapular region left, there is limitation of ROM  Skin:    General: Skin is warm and dry.  Neurological:     Mental Status: She is alert and oriented to person, place, and time.     Coordination: Coordination normal.     Vitals:   03/27/24 0816  BP: (!) 130/90  Pulse: 87  Temp: 98.5 F (36.9 C)  TempSrc: Oral  SpO2: 99%  Weight: 205 lb 12.8 oz (93.4 kg)  Height: 5' 3 (1.6 m)    Assessment and  Plan Assessment & Plan Left shoulder pain and limited range of motion   Chronic pain and limited motion post-trauma suggest a likely rotator cuff tendon injury or muscle tear, possibly a partial tear worsened by use. Refer to Sports Medicine for an ultrasound to assess the tendon or muscle tear. Consider physical therapy or a corticosteroid injection based on ultrasound results.  Hypothyroidism   Her hypothyroidism is well-managed with stable thyroid  function. Manage thyroid  medication prescriptions.

## 2024-03-27 NOTE — Assessment & Plan Note (Signed)
 Chronic pain and limited motion post-trauma suggest a likely rotator cuff tendon injury or muscle tear, possibly a partial tear worsened by use. Refer to Sports Medicine for an ultrasound to assess the tendon or muscle tear. Consider physical therapy or a corticosteroid injection based on ultrasound results.

## 2024-04-07 ENCOUNTER — Ambulatory Visit: Admitting: Family Medicine

## 2024-04-07 ENCOUNTER — Ambulatory Visit (INDEPENDENT_AMBULATORY_CARE_PROVIDER_SITE_OTHER)

## 2024-04-07 ENCOUNTER — Other Ambulatory Visit: Payer: Self-pay

## 2024-04-07 ENCOUNTER — Encounter: Payer: Self-pay | Admitting: Family Medicine

## 2024-04-07 VITALS — BP 130/86 | HR 81 | Ht 63.0 in | Wt 203.0 lb

## 2024-04-07 DIAGNOSIS — Z9689 Presence of other specified functional implants: Secondary | ICD-10-CM | POA: Insufficient documentation

## 2024-04-07 DIAGNOSIS — M25512 Pain in left shoulder: Secondary | ICD-10-CM | POA: Diagnosis not present

## 2024-04-07 DIAGNOSIS — G8929 Other chronic pain: Secondary | ICD-10-CM

## 2024-04-07 NOTE — Patient Instructions (Addendum)
 Thank you for coming in today.   Please get an Xray today before you leave   A referral for physical therapy has been submitted. A representative from the physical therapy office will contact you to coordinate scheduling after confirming your benefits with your insurance provider. If you do not hear from the physical therapy office within the next 1-2 weeks, please let us  know.   See you back in 2 months

## 2024-04-07 NOTE — Progress Notes (Signed)
 I, Leotis Batter, CMA acting as a scribe for Artist Lloyd, MD.  Sandra Collins is a 38 y.o. female who presents to [facility] at [facility] today for evaluation of left shoulder pain. Pt was seen by PCP on 03/27/24 with c/o pain and limited ROM. Concerns for post trauma rotator cuff tendon injury or muscle tear.   Today, patient reports pain x 6 months. Limited ROM. Sx started after a large dog pulled at the leash. Unable to lay on the left side d/t pain. Pain radiating into the pec muscles. No pain when reaching back. Worsening pain with lateral raise and overhead reaching. Has tried CBD with short-term relief. Occasional n/t in the left arm/hand.    Pertinent review of systems: No fevers or chills  Relevant historical information: History of an cerebral aneurysm and seizure disorder. She did have an allergic reaction during some sort of anesthesia based procedure but were not certain what the allergy  is. Patient does have a vagus nerve stimulator but notes that it is MRI conditional.   Exam:  BP 130/86   Pulse 81   Ht 5' 3 (1.6 m)   Wt 203 lb (92.1 kg)   HC 6 (15.2 cm)   SpO2 99%   BMI 35.96 kg/m  General: Well Developed, well nourished, and in no acute distress.   MSK: Left shoulder normal-appearing normal motion pain with abduction. Strength mildly reduced abduction.  Intact external and internal rotation. Positive Hawkins and Neer's test. Positive empty can test. Negative Yergason's and speeds test.    Lab and Radiology Results  Diagnostic Limited MSK Ultrasound of: Left shoulder Biceps tendon intact. Subscapularis tendon is intact Supraspinatus tendon intact with mild subacromial bursitis.  No large retracted tear is visible. Infraspinatus tendon intact. AC joint mild effusion Impression: Subacromial bursitis   X-ray images left shoulder obtained today personally and independently interpreted. No acute fractures.  No significant  degenerative changes.  Vagus nerve stimulator present left chest wall. Await formal radiology review   Assessment and Plan: 38 y.o. female with left shoulder pain ongoing for 6 months after almost being pulled over by a dog.  Pain due to subacromial impingement and bursitis.  There may be some partial rotator cuff tear but that was not visualized on ultrasound today.  Plan for trial of physical therapy and recheck in about 2 months.  If not improved consider MRI.  Will need to double check safety of vagus nerve stimulator with MRI before we order it. Patient is apprehensive about allergic reaction.  It is unlikely that she will have a reaction to the bupivacaine  or lidocaine  plus the steroid component typically triamcinolone based on her allergy  history.  PDMP not reviewed this encounter. Orders Placed This Encounter  Procedures   US  LIMITED JOINT SPACE STRUCTURES UP LEFT(NO LINKED CHARGES)    Reason for Exam (SYMPTOM  OR DIAGNOSIS REQUIRED):   left shoulder pain    Preferred imaging location?:   [facility] Shoulder Left    Standing Status:   Future    Number of Occurrences:   1    Expiration Date:   05/07/2024    Reason for Exam (SYMPTOM  OR DIAGNOSIS REQUIRED):   left shoulder pain    Preferred imaging location?:   [facility]    Is patient pregnant?:   No   Ambulatory referral to Physical Therapy    Referral Priority:   Routine    Referral Type:  Physical Medicine    Referral Reason:   Specialty Services Required    Requested Specialty:   Physical Therapy    Number of Visits Requested:   1   No orders of the defined types were placed in this encounter.    Discussed warning signs or symptoms. Please see discharge instructions. Patient expresses understanding.   The above documentation has been reviewed and is accurate and complete Artist Lloyd, M.D.

## 2024-04-09 ENCOUNTER — Ambulatory Visit: Payer: Self-pay | Admitting: Family Medicine

## 2024-04-09 ENCOUNTER — Ambulatory Visit (INDEPENDENT_AMBULATORY_CARE_PROVIDER_SITE_OTHER)

## 2024-04-09 VITALS — BP 141/84 | HR 90 | Ht 63.0 in | Wt 204.0 lb

## 2024-04-09 DIAGNOSIS — Z0000 Encounter for general adult medical examination without abnormal findings: Secondary | ICD-10-CM

## 2024-04-09 NOTE — Progress Notes (Cosign Needed Addendum)
 Chief Complaint  Patient presents with   Medicare Wellness     Subjective:   Sandra Collins is a 38 y.o. female who presents for a Medicare Annual Wellness Visit.  Allergies (verified) Estrogens, Tape, Amoxicillin, Latex, Metronidazole, and Pregabalin   History: Past Medical History:  Diagnosis Date   Angio-edema    Depression    Epilepsy (HCC)    Headache    Hyperlipidemia    Hypothyroidism    Short-term memory loss    Caused by seizures   Thyroid  cancer [facility])    Past Surgical History:  Procedure Laterality Date   BRAIN SURGERY  05/03/2006   IMPLANTATION VAGAL NERVE STIMULATOR  09/28/2015   IR 3D INDEPENDENT WKST  07/03/2021   IR ANGIO INTRA EXTRACRAN SEL INTERNAL CAROTID BILAT MOD SED  07/03/2021   IR ANGIO VERTEBRAL SEL VERTEBRAL UNI R MOD SED  07/03/2021   IR CT HEAD LTD  07/03/2021   IR RADIOLOGIST EVAL & MGMT  06/12/2021   IR RADIOLOGIST EVAL & MGMT  07/25/2021   IR TRANSCATH/EMBOLIZ  07/03/2021   RADIOLOGY WITH ANESTHESIA N/A 07/03/2021   Procedure: Cerebral angiogram with intent to embolize brain aneurysm;  Surgeon: Dolphus Carrion, MD;  Location: [facility] OR;  Service: Radiology;  Laterality: N/A;   THYROID  SURGERY     cancer   VAGUS NERVE STIMULATOR INSERTION Left 09/25/2021   Procedure: Vagus Nerve Stimulator  Battery Change;  Surgeon: Lanis Pupa, MD;  Location: [facility] OR;  Service: Neurosurgery;  Laterality: Left;   Family History  Problem Relation Age of Onset   Asthma Mother    Allergic rhinitis Mother    Breast cancer Mother    Cancer Mother    Angioedema Neg Hx    Eczema Neg Hx    Urticaria Neg Hx    Social History   Occupational History   Occupation: disabled  Tobacco Use   Smoking status: Never    Passive exposure: Never   Smokeless tobacco: Never  Vaping Use   Vaping status: Never Used  Substance and Sexual Activity   Alcohol use: Yes    Comment: rare   Drug use: Never   Sexual activity: Yes    Birth control/protection: Injection    Tobacco Counseling Counseling given: Not Answered  SDOH Screenings   Food Insecurity: No Food Insecurity (04/09/2024)  Housing: Unknown (04/09/2024)  Transportation Needs: No Transportation Needs (04/09/2024)  Utilities: Not At Risk (04/09/2024)  Alcohol Screen: Low Risk  (03/23/2024)  Depression (PHQ2-9): Low Risk  (04/09/2024)  Financial Resource Strain: Medium Risk (03/23/2024)  Physical Activity: Sufficiently Active (04/09/2024)  Recent Concern: Physical Activity - Insufficiently Active (03/23/2024)  Social Connections: Moderately Integrated (04/09/2024)  Stress: No Stress Concern Present (04/09/2024)  Tobacco Use: Low Risk  (04/09/2024)  Health Literacy: Inadequate Health Literacy (04/09/2024)   See flowsheets for full screening details  Depression Screen PHQ 2 & 9 Depression Scale- Over the past 2 weeks, how often have you been bothered by any of the following problems? Little interest or pleasure in doing things: 0 Feeling down, depressed, or hopeless (PHQ Adolescent also includes...irritable): 1 PHQ-2 Total Score: 1 Trouble falling or staying asleep, or sleeping too much: 1 Feeling tired or having little energy: 0 Poor appetite or overeating (PHQ Adolescent also includes...weight loss): 0 Feeling bad about yourself - or that you are a failure or have let yourself or your family down: 0 Trouble concentrating on things, such as reading the newspaper or watching television (PHQ Adolescent also includes...like school  work): 0 Moving or speaking so slowly that other people could have noticed. Or the opposite - being so fidgety or restless that you have been moving around a lot more than usual: 0 Thoughts that you would be better off dead, or of hurting yourself in some way: 0 PHQ-9 Total Score: 2 If you checked off any problems, how difficult have these problems made it for you to do your work, take care of things at home, or get along with other people?: Not difficult at  all  Depression Treatment Depression Interventions/Treatment : EYV7-0 Score <4 Follow-up Not Indicated     Goals Addressed             This Visit's Progress    Client understands the importance of follow-up with providers by attending scheduled visits   On track    To lose weight.       Visit info / Clinical Intake: Medicare Wellness Visit Type:: Subsequent Annual Wellness Visit Persons participating in visit:: patient Medicare Wellness Visit Mode:: In-person (required for WTM) Information given by:: patient Interpreter Needed?: No Pre-visit prep was completed: yes AWV questionnaire completed by patient prior to visit?: yes Date:: 04/05/24 Living arrangements:: with family/others Patient's Overall Health Status Rating: good Typical amount of pain: none Does pain affect daily life?: no Are you currently prescribed opioids?: no  Dietary Habits and Nutritional Risks How many meals a day?: 3 Eats fruit and vegetables daily?: (!) no Most meals are obtained by: preparing own meals; eating out Diabetic:: no  Functional Status Activities of Daily Living (to include ambulation/medication): (Patient-Rptd) Independent Ambulation: (Patient-Rptd) Independent Medication Administration: Independent Home Management: Dependent (cooking) Manage your own finances?: (!) no (mom handles) Primary transportation is: family/friends (mom drives her around) Concerns about vision?: no *vision screening is required for WTM* Concerns about hearing?: no  Fall Screening Falls in the past year?: (Patient-Rptd) 0 Number of falls in past year: (Patient-Rptd) 1 Was there an injury with Fall?: (Patient-Rptd) 0 Fall Risk Category Calculator: (Patient-Rptd) 1 Patient Fall Risk Level: (Patient-Rptd) Low Fall Risk  Fall Risk Patient at Risk for Falls Due to: No Fall Risks Fall risk Follow up: Falls evaluation completed; Falls prevention discussed  Home and Transportation Safety: All rugs have  non-skid backing?: yes All stairs or steps have railings?: (!) no (2 steps outside) Grab bars in the bathtub or shower?: (!) no Have non-skid surface in bathtub or shower?: yes Good home lighting?: yes Regular seat belt use?: yes Hospital stays in the last year:: no  Cognitive Assessment Difficulty concentrating, remembering, or making decisions? : yes Will 6CIT or Mini Cog be Completed: no 6CIT or Mini Cog Declined: patient has a diagnosis of dementia or cognitive impairment  Advance Directives (For Healthcare) Does Patient Have a Medical Advance Directive?: No Would patient like information on creating a medical advance directive?: No - Patient declined  Reviewed/Updated  Reviewed/Updated: Reviewed All (Medical, Surgical, Family, Medications, Allergies, Care Teams, Patient Goals)        Objective:    Today's Vitals   04/09/24 1004  BP: (!) 141/84  Pulse: 90  SpO2: 99%  Weight: 204 lb (92.5 kg)  Height: 5' 3 (1.6 m)   Body mass index is 36.14 kg/m.  Current Medications (verified) Outpatient Encounter Medications as of 04/09/2024  Medication Sig   Ascorbic Acid (VITAMIN C) 1000 MG tablet Take 1,000 mg by mouth daily.   aspirin  EC 81 MG tablet Take 81 mg by mouth daily. Swallow whole.   b  complex vitamins capsule Take 1 capsule by mouth daily.   Calcium-Magnesium-Vitamin D  (CALCIUM 1200+D3 PO) Take 1 tablet by mouth 2 (two) times daily.   cholecalciferol (VITAMIN D3) 25 MCG (1000 UNIT) tablet Take 1,000 Units by mouth daily.   Estradiol  10 MCG TABS vaginal tablet Place 1 tablet (10 mcg total) vaginally 2 (two) times a week.   ibuprofen (ADVIL) 200 MG tablet Take 400 mg by mouth every 6 (six) hours as needed for headache.   levothyroxine  (SYNTHROID ) 50 MCG tablet Take 1 tablet (50 mcg total) by mouth daily before breakfast.   LORazepam  (ATIVAN ) 2 MG tablet Take 1 tablet (2 mg total) by mouth daily as needed for seizure.   medroxyPROGESTERone  Acetate 150 MG/ML SUSY  Inject 1 mL (150 mg total) into the muscle every 3 (three) months.   Melatonin 10 MG TABS Take 10 mg by mouth at bedtime.   Multiple Vitamins-Minerals (MULTIVITAMIN WITH MINERALS) tablet Take 1 tablet by mouth daily.   NON FORMULARY Place 25 mg under the tongue daily. CBD oil   Omega-3 Fatty Acids (FISH OIL) 1200 MG CPDR Take 1,200 mg by mouth daily.   topiramate  (TOPAMAX ) 100 MG tablet Take 1 tablet every night   vitamin B-12 (CYANOCOBALAMIN) 1000 MCG tablet Take 1,000 mcg by mouth daily.   No facility-administered encounter medications on file as of 04/09/2024.   Hearing/Vision screen Hearing Screening - Comments:: Denies hearing difficulties   Vision Screening - Comments:: Denies vision issues./UTD/  Immunizations and Health Maintenance Health Maintenance  Topic Date Due   HIV Screening  Never done   Hepatitis C Screening  Never done   DTaP/Tdap/Td (1 - Tdap) Never done   Hepatitis B Vaccines 19-59 Average Risk (1 of 3 - 19+ 3-dose series) Never done   HPV VACCINES (1 - Risk 3-dose SCDM series) Never done   Influenza Vaccine  12/27/2023   COVID-19 Vaccine (4 - 2025-26 season) 01/27/2024   Medicare Annual Wellness (AWV)  04/09/2025   Cervical Cancer Screening (HPV/Pap Cotest)  08/14/2028   Pneumococcal Vaccine  Aged Out   Meningococcal B Vaccine  Aged Out   Mammogram  Discontinued        Assessment/Plan:  This is a routine wellness examination for Meerab.  Patient Care Team: Rollene Almarie LABOR, MD as PCP - General (Internal Medicine) Georjean Darice HERO, MD as Consulting Physician (Neurology) Debarah Lorrene DEL., MD as Consulting Physician (Ophthalmology) Steva, [facility] as Consulting Physician (Otolaryngology)  I have personally reviewed and noted the following in the patient's chart:   Medical and social history Use of alcohol, tobacco or illicit drugs  Current medications and supplements including opioid prescriptions. Functional ability and  status Nutritional status Physical activity Advanced directives List of other physicians Hospitalizations, surgeries, and ER visits in previous 12 months Vitals Screenings to include cognitive, depression, and falls Referrals and appointments  No orders of the defined types were placed in this encounter.  In addition, I have reviewed and discussed with patient certain preventive protocols, quality metrics, and best practice recommendations. A written personalized care plan for preventive services as well as general preventive health recommendations were provided to patient.   Weslie Rasmus L Christinea Brizuela, CMA   04/09/2024   Return in 1 year (on 04/09/2025).  After Visit Summary: (MyChart) Due to this being a telephonic visit, the after visit summary with patients personalized plan was offered to patient via MyChart   Nurse Notes: Patient declines all vaccines due.  Patient is up to  date on all health maintenance with no concerns to address today.  Medical screening examination/treatment/procedure(s) were performed by non-physician practitioner and as supervising physician I was immediately available for consultation/collaboration.  I agree with above. Karlynn Noel, MD

## 2024-04-09 NOTE — Progress Notes (Signed)
 Left shoulder xray looks ok to radiology

## 2024-04-09 NOTE — Patient Instructions (Addendum)
 Sandra Collins,  Thank you for taking the time for your Medicare Wellness Visit. I appreciate your continued commitment to your health goals. Please review the care plan we discussed, and feel free to reach out if I can assist you further.  Please note that Annual Wellness Visits do not include a physical exam. Some assessments may be limited, especially if the visit was conducted virtually. If needed, we may recommend an in-person follow-up with your provider.  Ongoing Care Seeing your primary care provider every 3 to 6 months helps us  monitor your health and provide consistent, personalized care. Last office visit 03/27/2024.  Aim for 30 minutes of exercise or brisk walking, 6-8 glasses of water, and 5 servings of fruits and vegetables each day.   Referrals If a referral was made during today's visit and you haven't received any updates within two weeks, please contact the referred provider directly to check on the status.  Recommended Screenings:  Health Maintenance  Topic Date Due   HIV Screening  Never done   Hepatitis C Screening  Never done   DTaP/Tdap/Td vaccine (1 - Tdap) Never done   Hepatitis B Vaccine (1 of 3 - 19+ 3-dose series) Never done   HPV Vaccine (1 - Risk 3-dose SCDM series) Never done   Flu Shot  12/27/2023   COVID-19 Vaccine (4 - 2025-26 season) 01/27/2024   Medicare Annual Wellness Visit  04/09/2025   Pap with HPV screening  08/14/2028   Pneumococcal Vaccine  Aged Out   Meningitis B Vaccine  Aged Out   Breast Cancer Screening  Discontinued       11/19/2023    8:32 AM  Advanced Directives  Does Patient Have a Medical Advance Directive? No    Vision: Annual vision screenings are recommended for early detection of glaucoma, cataracts, and diabetic retinopathy. These exams can also reveal signs of chronic conditions such as diabetes and high blood pressure.  Dental: Annual dental screenings help detect early signs of oral cancer, gum disease, and other  conditions linked to overall health, including heart disease and diabetes.  Please see the attached documents for additional preventive care recommendations.

## 2024-04-10 ENCOUNTER — Telehealth: Payer: Self-pay | Admitting: Family Medicine

## 2024-04-10 NOTE — Telephone Encounter (Signed)
 Patient called and stated that her mom goes to [facility]. Patient would like to see if referral could be sent there so they are not driving to two different places so often a week. Please advise.

## 2024-04-13 NOTE — Telephone Encounter (Signed)
 Noted, looks like referral reflects [facility] at PT locations and benefits have been checked.   Anything we need to do on our end?

## 2024-04-13 NOTE — Telephone Encounter (Signed)
 Patient has decided to schedule here for PT.

## 2024-04-14 ENCOUNTER — Ambulatory Visit: Admitting: Physical Therapy

## 2024-04-28 ENCOUNTER — Telehealth: Payer: Self-pay

## 2024-04-28 NOTE — Therapy (Signed)
 OUTPATIENT PHYSICAL THERAPY EVALUATION   Patient Name: Sandra Collins MRN: [id number] DOB:Jun 04, 1985, 38 y.o., female Today's Date: 04/29/2024   END OF SESSION:  PT End of Session - 04/29/24 0931     Visit Number 1    Number of Visits 9    Date for Recertification  06/24/24    Authorization Type BCBS MCR    PT Start Time 0931    PT Stop Time 1035    PT Time Calculation (min) 64 min    Activity Tolerance Patient tolerated treatment well    Behavior During Therapy WFL for tasks assessed/performed          Past Medical History:  Diagnosis Date   Angio-edema    Depression    Epilepsy (HCC)    Headache    Hyperlipidemia    Hypothyroidism    Short-term memory loss    Caused by seizures   Thyroid  cancer (HCC)    Past Surgical History:  Procedure Laterality Date   BRAIN SURGERY  05/03/2006   IMPLANTATION VAGAL NERVE STIMULATOR  09/28/2015   IR 3D INDEPENDENT WKST  07/03/2021   IR ANGIO INTRA EXTRACRAN SEL INTERNAL CAROTID BILAT MOD SED  07/03/2021   IR ANGIO VERTEBRAL SEL VERTEBRAL UNI R MOD SED  07/03/2021   IR CT HEAD LTD  07/03/2021   IR RADIOLOGIST EVAL & MGMT  06/12/2021   IR RADIOLOGIST EVAL & MGMT  07/25/2021   IR TRANSCATH/EMBOLIZ  07/03/2021   RADIOLOGY WITH ANESTHESIA N/A 07/03/2021   Procedure: Cerebral angiogram with intent to embolize brain aneurysm;  Surgeon: Dolphus Carrion, MD;  Location: [facility] OR;  Service: Radiology;  Laterality: N/A;   THYROID  SURGERY     cancer   VAGUS NERVE STIMULATOR INSERTION Left 09/25/2021   Procedure: Vagus Nerve Stimulator  Battery Change;  Surgeon: Lanis Pupa, MD;  Location: [facility] OR;  Service: Neurosurgery;  Laterality: Left;   Patient Active Problem List   Diagnosis Date Noted   Status post VNS (vagus nerve stimulator) placement 04/07/2024   Chronic left shoulder pain 03/27/2024   Environmental allergies 07/04/2023   Dysphagia 08/20/2022   Routine general medical examination at a health care facility 02/19/2022   Birth  control counseling 08/01/2021   Aneurysm, 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   Scoliosis 05/02/2021   Cerebral aneurysm 05/02/2021   Obesity 05/02/2021   Family history of breast cancer 05/02/2021   Anxiety about health 04/26/2021   Elevated LDL cholesterol level 09/06/2020   Hypothyroidism 09/06/2020   Seizure (HCC) 09/06/2020   Papillary thyroid  carcinoma (HCC) 02/23/2020   Vitamin D  deficiency 02/23/2020    PCP: Rollene Almarie LABOR, MD  REFERRING PROVIDER: Joane Artist RAMAN, MD  REFERRING DIAG: Chronic left shoulder pain  THERAPY DIAG:  Chronic left shoulder pain  Muscle weakness (generalized)  Rationale for Evaluation and Treatment: Rehabilitation  ONSET DATE: May 2025   SUBJECTIVE:        SUBJECTIVE STATEMENT: Patient reports back in May she was walking a dog on a leash and the dog pulled on the leash which pulled on the left shoulder. She also has been having to do more of the heavy lifting at home. She states she is unable to sleep on the left shoulder. She has been putting CBD cream on the left shoulder which has helped with the sleeping. Whenever she holds the arm arm out more than 3 seconds then the pain will increase and rarely will have pain down the left arm. She used to go to the gym but  has not since the shoulder injury. She denies any clicking/popping. She reports occasional and rare numbness/tingling in left arm that is primarily with sleeping but also when she tired to do a push-up.   Hand dominance: Right  PERTINENT HISTORY: See PMH above  PAIN:  Are you having pain? Yes:  NPRS scale: 5-6/10 pain, 7/10 when raising the arm Pain location: Left shoulder  Pain description: ripping Aggravating factors: Raising the arm out to the side Relieving factors: CBD cream  PRECAUTIONS: None  RED FLAGS: None   WEIGHT BEARING RESTRICTIONS: No  FALLS:  Has patient fallen in last 6 months? No  PLOF: Independent  PATIENT GOALS: Left shoulder pain relief and return  to gym exercise   OBJECTIVE:  Note: Objective measures were completed at Evaluation unless otherwise noted. PATIENT SURVEYS:  PSFS: 2.67 Sleeping: 3 Raising left arm for more than 3 seconds: 3 Putting arm overhead: 2  COGNITION: Overall cognitive status: Within functional limits for tasks assessed     SENSATION: WFL  POSTURE: Rounded shoulder and forward head posture  UPPER EXTREMITY ROM:   Active ROM Right eval Left eval  Shoulder flexion 145 75  Shoulder extension    Shoulder abduction 140 65  Shoulder adduction    Shoulder internal rotation T8 T10  Shoulder external rotation 65 / T2 45 / Occiput  Elbow flexion    Elbow extension    Wrist flexion    Wrist extension    Wrist ulnar deviation    Wrist radial deviation    Wrist pronation    Wrist supination    (Blank rows = not tested)  Patient exhibits left shoulder PROM better than AROM but still limited with empty end feels and pain at end ranges  UPPER EXTREMITY MMT:  MMT Right eval Left eval  Shoulder flexion    Shoulder extension    Shoulder abduction    Shoulder adduction    Shoulder internal rotation 5 5  Shoulder external rotation 5 4  Middle trapezius    Lower trapezius    Elbow flexion    Elbow extension    Wrist flexion    Wrist extension    Wrist ulnar deviation    Wrist radial deviation    Wrist pronation    Wrist supination    Grip strength (lbs)    (Blank rows = not tested)  JOINT MOBILITY TESTING:  Not formally assessed  PALPATION:  Generalized tenderness of left shoulder and posterior cuff                                                                                                                             TREATMENT  [facility] Adult PT Treatment:  DATE: 04/29/2024 Supine dowel press x 5 Supine dowel flexion x 5 Sidelying shoulder abduction palm forward x 5 - patient reports increased pain so d/c'd Sidelying shoulder ER x  10 Seated table slide flexion x 5 Standing wall slide shoulder flexion x 10 Seated table slide abduction/scaption x 5 Standing shoulder abduction isometric with elbow bent x 5, with elbow straight x 5  Spent time discussing likely etiology of symptoms as rotator cuff related pain / tendinopathy. Discussed performing exercises through comfortable range and explained acceptable pain range. Goal of therapy to gradually progress activity and exercise level at appropriate level to improve activity tolerance. Discussed expected return to gym exercises and will gradually introduce gym exercises. Also spent time discussing posture while gaming and watching TV in bed, taking breaks while sitting to avoid static postures for extended periods.   PATIENT EDUCATION: Education details: Exam findings, POC, HEP Person educated: Patient and mother Education method: Explanation, Demonstration, Tactile cues, Verbal cues, and Handouts Education comprehension: verbalized understanding, returned demonstration, verbal cues required, tactile cues required, and needs further education  HOME EXERCISE PROGRAM: Access Code: [email or web address]    ASSESSMENT: CLINICAL IMPRESSION: Patient is a 38 y.o. female who was seen today for physical therapy evaluation and treatment for chronic left shoulder pain. Her left shoulder symptoms are consistent with rotator cuff related pain and she exhibits limitations in active > passive range of motion and strength deficits of the left shoulder. Did not formally assess shoulder flexion/abduction strength as these were painful with active motion and obviously weak. Provided patient with exercises focused on stretching and gravity reduced active motion for the left shoulder and rotator cuff. Patient would benefit from continued skilled PT to progress mobility and strength of the left shoulder in order to reduce pain and maximize functional ability.    OBJECTIVE IMPAIRMENTS: decreased activity  tolerance, decreased ROM, decreased strength, postural dysfunction, and pain.   ACTIVITY LIMITATIONS: carrying, lifting, sleeping, bathing, dressing, reach over head, and hygiene/grooming  PARTICIPATION LIMITATIONS: meal prep, cleaning, and community activity  PERSONAL FACTORS: Fitness, Past/current experiences, and Time since onset of injury/illness/exacerbation are also affecting patient's functional outcome.   REHAB POTENTIAL: Good  CLINICAL DECISION MAKING: Stable/uncomplicated  EVALUATION COMPLEXITY: Low   GOALS: Goals reviewed with patient? Yes  SHORT TERM GOALS: Target date: 05/27/2024  Patient will be I with initial HEP in order to progress with therapy. Baseline: HEP provided at eval Goal status: INITIAL  2.  Patient will report left shoulder pain with active movement overhead </= 5/10 in order to reduce functional limitations Baseline: 7/10 Goal status: INITIAL  LONG TERM GOALS: Target date: 06/24/2024  Patient will be I with final HEP to maintain progress from PT. Baseline: HEP provided at eval Goal status: INITIAL  2.  Patient will report PSFS >/= 7 in order to indicate improvement in their functional ability. Baseline: 2.67 Goal status: INITIAL  3.  Patient will demonstrate left shoulder AROM grossly WFL / equal to opposite side in order to improve overhead reach and self care tasks Baseline: see limitations above Goal status: INITIAL  4.  Patient will demonstrate left shoulder strength >/= 4+/5 MMT in order to improve activity tolerance and return to gym exercise without limitation Baseline: see limitations above Goal status: INITIAL  5. Patient will report left shoulder pain with activity </= 2/10 in order to reduce functional limitations Baseline: 7/10 Goal status: INITIAL   PLAN: PT FREQUENCY: 1x/week  PT DURATION: 8 weeks  PLANNED INTERVENTIONS: 02835- PT Re-evaluation, 97750-  Physical Performance Testing, 97110-Therapeutic exercises, 97530-  Therapeutic activity, [id number]- Neuromuscular re-education, [phone]- Self Care, 02859- Manual therapy, 20560 (1-2 muscles), 20561 (3+ muscles)- Dry Needling, Patient/Family education, Taping, Joint mobilization, Joint manipulation, Spinal manipulation, Spinal mobilization, Cryotherapy, and Moist heat  PLAN FOR NEXT SESSION: Review HEP and progress PRN, manual for left shoulder mobility, progress left shoulder PROM>AAROM>AROM as tolerated, left shoulder strengthening isometric>isotonic exercises as tolerated   Elaine Daring, PT, DPT, LAT, ATC 04/29/24  11:07 AM Phone: [phone] Fax: [phone]

## 2024-04-28 NOTE — Telephone Encounter (Signed)
 Copied from CRM #[id number]. Topic: Clinical - Request for Lab/Test Order >> Apr 28, 2024  8:15 AM Pinkey ORN wrote: Reason for CRM: Mammogram >> Apr 28, 2024  8:17 AM Pinkey ORN wrote: Patient states she received a letter via rhona that it's time for her mammogram, but patient is requesting to have it completed elsewhere. States she's had too many bad experiences with the place she's been going to.   Patient is requesting to go to:  [address] [place] [address]

## 2024-04-29 ENCOUNTER — Other Ambulatory Visit: Payer: Self-pay

## 2024-04-29 ENCOUNTER — Ambulatory Visit: Admitting: Physical Therapy

## 2024-04-29 ENCOUNTER — Encounter: Payer: Self-pay | Admitting: Physical Therapy

## 2024-04-29 DIAGNOSIS — M25512 Pain in left shoulder: Secondary | ICD-10-CM | POA: Diagnosis not present

## 2024-04-29 DIAGNOSIS — G8929 Other chronic pain: Secondary | ICD-10-CM | POA: Diagnosis not present

## 2024-04-29 DIAGNOSIS — M6281 Muscle weakness (generalized): Secondary | ICD-10-CM | POA: Diagnosis not present

## 2024-04-29 NOTE — Patient Instructions (Signed)
 Access Code: [email or web address] URL: [email or web address] Date: 04/29/2024 Prepared by: Elaine Daring  Exercises - Supine Shoulder Flexion Extension AAROM with Dowel  - 3 sets - 5 reps - 3 seconds hold - Sidelying Shoulder External Rotation  - 1 x daily - 2 sets - 10 reps - Standing shoulder flexion wall slides  - 2 sets - 10 reps - 3 seconds hold - Seated Shoulder Abduction Towel Slide at Table Top  - 2 sets - 10 reps - 3 seconds hold - Isometric Shoulder Abduction - Arm Straight at Wall  - 1 x daily - 3 sets - 5 reps - 3 seconds hold

## 2024-05-04 NOTE — Telephone Encounter (Signed)
 I do not believe that [facility] [place] does diagnostic mammograms. This is a very specific test with a radiologist on site to read. [facility] could be an option if she wants.

## 2024-05-05 ENCOUNTER — Ambulatory Visit (INDEPENDENT_AMBULATORY_CARE_PROVIDER_SITE_OTHER): Admitting: Physical Therapy

## 2024-05-05 ENCOUNTER — Encounter: Payer: Self-pay | Admitting: Physical Therapy

## 2024-05-05 ENCOUNTER — Other Ambulatory Visit: Payer: Self-pay

## 2024-05-05 DIAGNOSIS — G8929 Other chronic pain: Secondary | ICD-10-CM

## 2024-05-05 DIAGNOSIS — M6281 Muscle weakness (generalized): Secondary | ICD-10-CM

## 2024-05-05 DIAGNOSIS — M25512 Pain in left shoulder: Secondary | ICD-10-CM

## 2024-05-05 NOTE — Patient Instructions (Signed)
 Access Code: [email or web address] URL: [email or web address] Date: 05/05/2024 Prepared by: Elaine Daring  Exercises - Supine Shoulder Flexion Extension AAROM with Dowel  - 1 x daily - 2 sets - 10 reps - Sidelying Shoulder ER with Towel and Dumbbell  - 1 x daily - 2 sets - 10 reps - Sidelying Shoulder Abduction Palm Forward  - 1 x daily - 2 sets - 10 reps - Step Back Shoulder Stretch with Chair  - 1 x daily - 2 sets - 5 reps - 5 seconds hold - Scaption with Dumbbells  - 1 x daily - 2 sets - 10 reps - Push-Up on Counter  - 1 x daily - 2 sets - 10 reps - Standing Row with Anchored Resistance  - 1 x daily - 2 sets - 10 reps

## 2024-05-05 NOTE — Therapy (Signed)
 OUTPATIENT PHYSICAL THERAPY TREATMENT   Patient Name: Sandra Collins MRN: [id number] DOB:[birth year], 38 y.o., female Today's Date: 05/05/2024   END OF SESSION:  PT End of Session - 05/05/24 1148     Visit Number 2    Number of Visits 9    Date for Recertification  06/24/24    Authorization Type BCBS MCR    PT Start Time 1145    PT Stop Time 1227    PT Time Calculation (min) 42 min    Activity Tolerance Patient tolerated treatment well    Behavior During Therapy WFL for tasks assessed/performed           Past Medical History:  Diagnosis Date   Angio-edema    Depression    Epilepsy (HCC)    Headache    Hyperlipidemia    Hypothyroidism    Short-term memory loss    Caused by seizures   Thyroid  cancer [facility])    Past Surgical History:  Procedure Laterality Date   BRAIN SURGERY  05/03/2006   IMPLANTATION VAGAL NERVE STIMULATOR  09/28/2015   IR 3D INDEPENDENT WKST  07/03/2021   IR ANGIO INTRA EXTRACRAN SEL INTERNAL CAROTID BILAT MOD SED  07/03/2021   IR ANGIO VERTEBRAL SEL VERTEBRAL UNI R MOD SED  07/03/2021   IR CT HEAD LTD  07/03/2021   IR RADIOLOGIST EVAL & MGMT  06/12/2021   IR RADIOLOGIST EVAL & MGMT  07/25/2021   IR TRANSCATH/EMBOLIZ  07/03/2021   RADIOLOGY WITH ANESTHESIA N/A 07/03/2021   Procedure: Cerebral angiogram with intent to embolize brain aneurysm;  Surgeon: Dolphus Carrion, MD;  Location: [facility] OR;  Service: Radiology;  Laterality: N/A;   THYROID  SURGERY     cancer   VAGUS NERVE STIMULATOR INSERTION Left 09/25/2021   Procedure: Vagus Nerve Stimulator  Battery Change;  Surgeon: Lanis Pupa, MD;  Location: [facility] OR;  Service: Neurosurgery;  Laterality: Left;   Patient Active Problem List   Diagnosis Date Noted   Status post VNS (vagus nerve stimulator) placement 04/07/2024   Chronic left shoulder pain 03/27/2024   Environmental allergies 07/04/2023   Dysphagia 08/20/2022   Routine general medical examination at a health care facility 02/19/2022   Birth  control counseling 08/01/2021   Aneurysm, 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   Scoliosis 05/02/2021   Cerebral aneurysm 05/02/2021   Obesity 05/02/2021   Family history of breast cancer 05/02/2021   Anxiety about health 04/26/2021   Elevated LDL cholesterol level 09/06/2020   Hypothyroidism 09/06/2020   Seizure (HCC) 09/06/2020   Papillary thyroid  carcinoma (HCC) 02/23/2020   Vitamin D  deficiency 02/23/2020    PCP: Rollene Almarie LABOR, MD  REFERRING PROVIDER: Joane Artist RAMAN, MD  REFERRING DIAG: Chronic left shoulder pain  THERAPY DIAG:  Chronic left shoulder pain  Muscle weakness (generalized)  Rationale for Evaluation and Treatment: Rehabilitation  ONSET DATE: May 2025   SUBJECTIVE:        SUBJECTIVE STATEMENT: Patient reports she has been doing the exercises every morning and it has been helping.   Eval: Patient reports back in May she was walking a dog on a leash and the dog pulled on the leash which pulled on the left shoulder. She also has been having to do more of the heavy lifting at home. She states she is unable to sleep on the left shoulder. She has been putting CBD cream on the left shoulder which has helped with the sleeping. Whenever she holds the arm arm out more than 3 seconds then the pain  will increase and rarely will have pain down the left arm. She used to go to the gym but has not since the shoulder injury. She denies any clicking/popping. She reports occasional and rare numbness/tingling in left arm that is primarily with sleeping but also when she tired to do a push-up.   Hand dominance: Right  PERTINENT HISTORY: See PMH above  PAIN:  Are you having pain? Yes:  NPRS scale: 5-6/10 pain, 7/10 when raising the arm Pain location: Left shoulder  Pain description: ripping Aggravating factors: Raising the arm out to the side Relieving factors: CBD cream  PRECAUTIONS: None  PATIENT GOALS: Left shoulder pain relief and return to gym  exercise   OBJECTIVE:  Note: Objective measures were completed at Evaluation unless otherwise noted. PATIENT SURVEYS:  PSFS: 2.67 Sleeping: 3 Raising left arm for more than 3 seconds: 3 Putting arm overhead: 2  POSTURE: Rounded shoulder and forward head posture  UPPER EXTREMITY ROM:   Active ROM Right eval Left eval Left 05/05/2024  Shoulder flexion 145 75 145  Shoulder extension     Shoulder abduction 140 65   Shoulder adduction     Shoulder internal rotation T8 T10   Shoulder external rotation 65 / T2 45 / Occiput   Elbow flexion     Elbow extension     Wrist flexion     Wrist extension     Wrist ulnar deviation     Wrist radial deviation     Wrist pronation     Wrist supination     (Blank rows = not tested)  Patient exhibits left shoulder PROM better than AROM but still limited with empty end feels and pain at end ranges  UPPER EXTREMITY MMT:  MMT Right eval Left eval  Shoulder flexion    Shoulder extension    Shoulder abduction    Shoulder adduction    Shoulder internal rotation 5 5  Shoulder external rotation 5 4  Middle trapezius    Lower trapezius    Elbow flexion    Elbow extension    Wrist flexion    Wrist extension    Wrist ulnar deviation    Wrist radial deviation    Wrist pronation    Wrist supination    Grip strength (lbs)    (Blank rows = not tested)  JOINT MOBILITY TESTING:  Not formally assessed  PALPATION:  Generalized tenderness of left shoulder and posterior cuff                                                                                                                             TREATMENT  [facility] Adult PT Treatment:                                                DATE: 05/05/2024 Supine dowel flexion  x 10 Supine horizontal abduction with yellow 2 x 10 Sidelying shoulder abduction palm forward 2 x 10 Sidelying shoulder ER x 10, with 1# x 10 Row with blue 2 x 10 Step back stretch at counter 5 x 5 sec Standing scaption with  1# x 10, 2# x 10 Counter push-up 2 x 10  PATIENT EDUCATION: Education details: HEP update Person educated: Patient and mother Education method: Explanation, Demonstration, Tactile cues, Verbal cues, and Handouts Education comprehension: verbalized understanding, returned demonstration, verbal cues required, tactile cues required, and needs further education  HOME EXERCISE PROGRAM: Access Code: [email or web address]    ASSESSMENT: CLINICAL IMPRESSION: Patient tolerated therapy well with no adverse effects. Therapy focused on progressing shoulder motion and strength with good tolerance. She exhibits much improved left shoulder motion this visit but does report continue pain at end range. She was able to progress with abduction shoulder AROM and with rotator cuff and periscapular strengthening using light weight and banded resistance. Updated her HEP to progress shoulder strengthening. Patient would benefit from continued skilled PT to progress mobility and strength in order to reduce pain and maximize functional ability.   Eval: Patient is a 39 y.o. female who was seen today for physical therapy evaluation and treatment for chronic left shoulder pain. Her left shoulder symptoms are consistent with rotator cuff related pain and she exhibits limitations in active > passive range of motion and strength deficits of the left shoulder. Did not formally assess shoulder flexion/abduction strength as these were painful with active motion and obviously weak. Provided patient with exercises focused on stretching and gravity reduced active motion for the left shoulder and rotator cuff. Patient would benefit from continued skilled PT to progress mobility and strength of the left shoulder in order to reduce pain and maximize functional ability.    OBJECTIVE IMPAIRMENTS: decreased activity tolerance, decreased ROM, decreased strength, postural dysfunction, and pain.   ACTIVITY LIMITATIONS: carrying, lifting, sleeping,  bathing, dressing, reach over head, and hygiene/grooming  PARTICIPATION LIMITATIONS: meal prep, cleaning, and community activity  PERSONAL FACTORS: Fitness, Past/current experiences, and Time since onset of injury/illness/exacerbation are also affecting patient's functional outcome.    GOALS: Goals reviewed with patient? Yes  SHORT TERM GOALS: Target date: 05/27/2024  Patient will be I with initial HEP in order to progress with therapy. Baseline: HEP provided at eval Goal status: INITIAL  2.  Patient will report left shoulder pain with active movement overhead </= 5/10 in order to reduce functional limitations Baseline: 7/10 Goal status: INITIAL  LONG TERM GOALS: Target date: 06/24/2024  Patient will be I with final HEP to maintain progress from PT. Baseline: HEP provided at eval Goal status: INITIAL  2.  Patient will report PSFS >/= 7 in order to indicate improvement in their functional ability. Baseline: 2.67 Goal status: INITIAL  3.  Patient will demonstrate left shoulder AROM grossly WFL / equal to opposite side in order to improve overhead reach and self care tasks Baseline: see limitations above Goal status: INITIAL  4.  Patient will demonstrate left shoulder strength >/= 4+/5 MMT in order to improve activity tolerance and return to gym exercise without limitation Baseline: see limitations above Goal status: INITIAL  5. Patient will report left shoulder pain with activity </= 2/10 in order to reduce functional limitations Baseline: 7/10 Goal status: INITIAL   PLAN: PT FREQUENCY: 1x/week  PT DURATION: 8 weeks  PLANNED INTERVENTIONS: 97164- PT Re-evaluation, 97750- Physical Performance Testing, 97110-Therapeutic exercises, 97530- Therapeutic activity, [id number]- Neuromuscular re-education, 97535- Self  Care, 02859- Manual therapy, 20560 (1-2 muscles), 20561 (3+ muscles)- Dry Needling, Patient/Family education, Taping, Joint mobilization, Joint manipulation, Spinal  manipulation, Spinal mobilization, Cryotherapy, and Moist heat  PLAN FOR NEXT SESSION: Review HEP and progress PRN, manual for left shoulder mobility, progress left shoulder PROM>AAROM>AROM as tolerated, left shoulder strengthening isometric>isotonic exercises as tolerated   Elaine Daring, PT, DPT, LAT, ATC 05/05/24  12:39 PM Phone: [phone] Fax: [phone]

## 2024-05-06 ENCOUNTER — Telehealth: Payer: Self-pay | Admitting: Neurology

## 2024-05-06 NOTE — Telephone Encounter (Signed)
 She can take guiafenesin/Mucinex for cough. Avoid taking anything with Diphenhydramine /Benadryl .

## 2024-05-06 NOTE — Telephone Encounter (Signed)
 Pt called an informed She can take guiafenesin/Mucinex for cough. Avoid taking anything with Diphenhydramine /Benadryl 

## 2024-05-06 NOTE — Telephone Encounter (Signed)
 Spoke with pt and informed of Dr. Marcel recommendation. Pt is unsure if she would like to go to [facility]. Pt to check out the office and give us  a call back with her decision.

## 2024-05-06 NOTE — Telephone Encounter (Signed)
 Sandra Collins called in stating she called earlier in regards to what medication can she take with her Topamax  that will not interfere with her medication.   PH: [phone]

## 2024-05-06 NOTE — Telephone Encounter (Signed)
 Spoke with pt they are asking what kind of cough medication the pt can take?

## 2024-05-14 ENCOUNTER — Encounter: Payer: Self-pay | Admitting: Physical Therapy

## 2024-05-14 ENCOUNTER — Ambulatory Visit: Admitting: Physical Therapy

## 2024-05-14 ENCOUNTER — Other Ambulatory Visit: Payer: Self-pay

## 2024-05-14 DIAGNOSIS — M25512 Pain in left shoulder: Secondary | ICD-10-CM | POA: Diagnosis not present

## 2024-05-14 DIAGNOSIS — M6281 Muscle weakness (generalized): Secondary | ICD-10-CM | POA: Diagnosis not present

## 2024-05-14 DIAGNOSIS — G8929 Other chronic pain: Secondary | ICD-10-CM

## 2024-05-14 NOTE — Therapy (Signed)
 OUTPATIENT PHYSICAL THERAPY TREATMENT   Patient Name: Aarilyn Dye MRN: [id number] DOB:07-Apr-1986, 38 y.o., female Today's Date: 05/14/2024   END OF SESSION:  PT End of Session - 05/14/24 1304     Visit Number 3    Number of Visits 9    Date for Recertification  06/24/24    Authorization Type BCBS MCR    Progress Note Due on Visit 10    PT Start Time 1148    PT Stop Time 1233    PT Time Calculation (min) 45 min    Activity Tolerance Patient tolerated treatment well    Behavior During Therapy WFL for tasks assessed/performed            Past Medical History:  Diagnosis Date   Angio-edema    Depression    Epilepsy (HCC)    Headache    Hyperlipidemia    Hypothyroidism    Short-term memory loss    Caused by seizures   Thyroid  cancer (HCC)    Past Surgical History:  Procedure Laterality Date   BRAIN SURGERY  05/03/2006   IMPLANTATION VAGAL NERVE STIMULATOR  09/28/2015   IR 3D INDEPENDENT WKST  07/03/2021   IR ANGIO INTRA EXTRACRAN SEL INTERNAL CAROTID BILAT MOD SED  07/03/2021   IR ANGIO VERTEBRAL SEL VERTEBRAL UNI R MOD SED  07/03/2021   IR CT HEAD LTD  07/03/2021   IR RADIOLOGIST EVAL & MGMT  06/12/2021   IR RADIOLOGIST EVAL & MGMT  07/25/2021   IR TRANSCATH/EMBOLIZ  07/03/2021   RADIOLOGY WITH ANESTHESIA N/A 07/03/2021   Procedure: Cerebral angiogram with intent to embolize brain aneurysm;  Surgeon: Dolphus Carrion, MD;  Location: [facility] OR;  Service: Radiology;  Laterality: N/A;   THYROID  SURGERY     cancer   VAGUS NERVE STIMULATOR INSERTION Left 09/25/2021   Procedure: Vagus Nerve Stimulator  Battery Change;  Surgeon: Lanis Pupa, MD;  Location: [facility] OR;  Service: Neurosurgery;  Laterality: Left;   Patient Active Problem List   Diagnosis Date Noted   Status post VNS (vagus nerve stimulator) placement 04/07/2024   Chronic left shoulder pain 03/27/2024   Environmental allergies 07/04/2023   Dysphagia 08/20/2022   Routine general medical examination at a health  care facility 02/19/2022   Birth control counseling 08/01/2021   Aneurysm, 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   Scoliosis 05/02/2021   Cerebral aneurysm 05/02/2021   Obesity 05/02/2021   Family history of breast cancer 05/02/2021   Anxiety about health 04/26/2021   Elevated LDL cholesterol level 09/06/2020   Hypothyroidism 09/06/2020   Seizure (HCC) 09/06/2020   Papillary thyroid  carcinoma (HCC) 02/23/2020   Vitamin D  deficiency 02/23/2020    PCP: Rollene Almarie LABOR, MD  REFERRING PROVIDER: Joane Artist RAMAN, MD  REFERRING DIAG: Chronic left shoulder pain  THERAPY DIAG:  Chronic left shoulder pain  Muscle weakness (generalized)  Rationale for Evaluation and Treatment: Rehabilitation  ONSET DATE: May 2025   SUBJECTIVE:        SUBJECTIVE STATEMENT: Patient reports she is doing well, she does her exercises every day.   Eval: Patient reports back in May she was walking a dog on a leash and the dog pulled on the leash which pulled on the left shoulder. She also has been having to do more of the heavy lifting at home. She states she is unable to sleep on the left shoulder. She has been putting CBD cream on the left shoulder which has helped with the sleeping. Whenever she holds the arm arm out  more than 3 seconds then the pain will increase and rarely will have pain down the left arm. She used to go to the gym but has not since the shoulder injury. She denies any clicking/popping. She reports occasional and rare numbness/tingling in left arm that is primarily with sleeping but also when she tired to do a push-up.   Hand dominance: Right  PERTINENT HISTORY: See PMH above  PAIN:  Are you having pain? Yes:  NPRS scale: 0/10 pain, 7/10 at worst Pain location: Left shoulder  Pain description: ripping Aggravating factors: Raising the arm out to the side Relieving factors: CBD cream  PRECAUTIONS: None  PATIENT GOALS: Left shoulder pain relief and return to gym  exercise   OBJECTIVE:  Note: Objective measures were completed at Evaluation unless otherwise noted. PATIENT SURVEYS:  PSFS: 2.67 Sleeping: 3 Raising left arm for more than 3 seconds: 3 Putting arm overhead: 2  POSTURE: Rounded shoulder and forward head posture  UPPER EXTREMITY ROM:   Active ROM Right eval Left eval Left 05/05/2024  Shoulder flexion 145 75 145  Shoulder extension     Shoulder abduction 140 65   Shoulder adduction     Shoulder internal rotation T8 T10   Shoulder external rotation 65 / T2 45 / Occiput   Elbow flexion     Elbow extension     Wrist flexion     Wrist extension     Wrist ulnar deviation     Wrist radial deviation     Wrist pronation     Wrist supination     (Blank rows = not tested)  Patient exhibits left shoulder PROM better than AROM but still limited with empty end feels and pain at end ranges  UPPER EXTREMITY MMT:  MMT Right eval Left eval  Shoulder flexion    Shoulder extension    Shoulder abduction    Shoulder adduction    Shoulder internal rotation 5 5  Shoulder external rotation 5 4  Middle trapezius    Lower trapezius    Elbow flexion    Elbow extension    Wrist flexion    Wrist extension    Wrist ulnar deviation    Wrist radial deviation    Wrist pronation    Wrist supination    Grip strength (lbs)    (Blank rows = not tested)  JOINT MOBILITY TESTING:  Not formally assessed  PALPATION:  Generalized tenderness of left shoulder and posterior cuff                                                                                                                             TREATMENT  [facility] Adult PT Treatment:  DATE: 05/14/2024 UBE L3 x 4 min (fwd/bwd) to improve endurance and workload capacity Standing banded ER/IR with red 3 x 10 each Bent over row on table with 15# 3 x 10 left - small towel roll under right wrist Wall ball circles at 90 deg flexion cw/ccw 2 x  20 Standing horizontal abduction with red 3 x 10 Standing scaption with 3# 3 x 10 Standing shoulder extension and scap retraction with red 3 x 10 Standing diagonals with red 2 x 10  PATIENT EDUCATION: Education details: HEP update Person educated: Patient and mother Education method: Explanation, Demonstration, Tactile cues, Verbal cues, and Handouts Education comprehension: verbalized understanding, returned demonstration, verbal cues required, tactile cues required, and needs further education  HOME EXERCISE PROGRAM: Access Code: [email or web address]    ASSESSMENT: CLINICAL IMPRESSION: Patient tolerated therapy well with no adverse effects. Therapy continued progression of shoulder strengthening with good tolerance. She is progressing well with more upright banded and weighted exercises, and incorporated more endurance based exercises this visit. She denies any increase in pain with therapy. Updated her HEP to progress shoulder strengthening. Patient would benefit from continued skilled PT to progress mobility and strength in order to reduce pain and maximize functional ability.   Eval: Patient is a 38 y.o. female who was seen today for physical therapy evaluation and treatment for chronic left shoulder pain. Her left shoulder symptoms are consistent with rotator cuff related pain and she exhibits limitations in active > passive range of motion and strength deficits of the left shoulder. Did not formally assess shoulder flexion/abduction strength as these were painful with active motion and obviously weak. Provided patient with exercises focused on stretching and gravity reduced active motion for the left shoulder and rotator cuff. Patient would benefit from continued skilled PT to progress mobility and strength of the left shoulder in order to reduce pain and maximize functional ability.    OBJECTIVE IMPAIRMENTS: decreased activity tolerance, decreased ROM, decreased strength, postural dysfunction,  and pain.   ACTIVITY LIMITATIONS: carrying, lifting, sleeping, bathing, dressing, reach over head, and hygiene/grooming  PARTICIPATION LIMITATIONS: meal prep, cleaning, and community activity  PERSONAL FACTORS: Fitness, Past/current experiences, and Time since onset of injury/illness/exacerbation are also affecting patient's functional outcome.    GOALS: Goals reviewed with patient? Yes  SHORT TERM GOALS: Target date: 05/27/2024  Patient will be I with initial HEP in order to progress with therapy. Baseline: HEP provided at eval Goal status: INITIAL  2.  Patient will report left shoulder pain with active movement overhead </= 5/10 in order to reduce functional limitations Baseline: 7/10 Goal status: INITIAL  LONG TERM GOALS: Target date: 06/24/2024  Patient will be I with final HEP to maintain progress from PT. Baseline: HEP provided at eval Goal status: INITIAL  2.  Patient will report PSFS >/= 7 in order to indicate improvement in their functional ability. Baseline: 2.67 Goal status: INITIAL  3.  Patient will demonstrate left shoulder AROM grossly WFL / equal to opposite side in order to improve overhead reach and self care tasks Baseline: see limitations above Goal status: INITIAL  4.  Patient will demonstrate left shoulder strength >/= 4+/5 MMT in order to improve activity tolerance and return to gym exercise without limitation Baseline: see limitations above Goal status: INITIAL  5. Patient will report left shoulder pain with activity </= 2/10 in order to reduce functional limitations Baseline: 7/10 Goal status: INITIAL   PLAN: PT FREQUENCY: 1x/week  PT DURATION: 8 weeks  PLANNED INTERVENTIONS: 02835- PT  Re-evaluation, 97750- Physical Performance Testing, 97110-Therapeutic exercises, 97530- Therapeutic activity, 97112- Neuromuscular re-education, [phone]- Self Care, 02859- Manual therapy, 20560 (1-2 muscles), 20561 (3+ muscles)- Dry Needling, Patient/Family  education, Taping, Joint mobilization, Joint manipulation, Spinal manipulation, Spinal mobilization, Cryotherapy, and Moist heat  PLAN FOR NEXT SESSION: Review HEP and progress PRN, manual for left shoulder mobility, progress left shoulder PROM>AAROM>AROM as tolerated, left shoulder strengthening isometric>isotonic exercises as tolerated   Elaine Daring, PT, DPT, LAT, ATC 05/14/2024  1:04 PM Phone: [phone] Fax: [phone]

## 2024-05-14 NOTE — Patient Instructions (Signed)
 Access Code: [email or web address] URL: [email or web address] Date: 05/14/2024 Prepared by: Elaine Daring  Exercises - Step Back Shoulder Stretch with Chair  - 1 x daily - 2 sets - 5 reps - 5 seconds hold - Scaption with Dumbbells  - 1 x daily - 3 sets - 10 reps - Push-Up on Counter  - 1 x daily - 3 sets - 10 reps - Standing Row with Anchored Resistance  - 1 x daily - 3 sets - 10 reps - Shoulder External Rotation with Anchored Resistance  - 1 x daily - 3 sets - 10 reps - Standing Shoulder Internal Rotation with Anchored Resistance  - 1 x daily - 3 sets - 10 reps - Standing Shoulder Horizontal Abduction with Resistance  - 1 x daily - 3 sets - 10 reps - Standing Shoulder Diagonal Horizontal Abduction 60/120 Degrees with Resistance  - 1 x daily - 3 sets - 10 reps

## 2024-05-19 ENCOUNTER — Other Ambulatory Visit: Payer: Self-pay

## 2024-05-19 ENCOUNTER — Encounter: Payer: Self-pay | Admitting: Physical Therapy

## 2024-05-19 ENCOUNTER — Ambulatory Visit (INDEPENDENT_AMBULATORY_CARE_PROVIDER_SITE_OTHER): Admitting: Neurology

## 2024-05-19 ENCOUNTER — Encounter: Payer: Self-pay | Admitting: Neurology

## 2024-05-19 ENCOUNTER — Ambulatory Visit: Admitting: Physical Therapy

## 2024-05-19 VITALS — BP 145/93 | HR 95 | Ht 63.0 in | Wt 209.8 lb

## 2024-05-19 DIAGNOSIS — G8929 Other chronic pain: Secondary | ICD-10-CM | POA: Diagnosis not present

## 2024-05-19 DIAGNOSIS — M6281 Muscle weakness (generalized): Secondary | ICD-10-CM

## 2024-05-19 DIAGNOSIS — M25512 Pain in left shoulder: Secondary | ICD-10-CM

## 2024-05-19 DIAGNOSIS — G40219 Localization-related (focal) (partial) symptomatic epilepsy and epileptic syndromes with complex partial seizures, intractable, without status epilepticus: Secondary | ICD-10-CM | POA: Diagnosis not present

## 2024-05-19 MED ORDER — LORAZEPAM 2 MG PO TABS: 2.0000 mg | ORAL_TABLET | Freq: Every day | ORAL | 5 refills | Status: AC | PRN

## 2024-05-19 MED ORDER — TOPIRAMATE 100 MG PO TABS: ORAL_TABLET | ORAL | 3 refills | Status: AC

## 2024-05-19 MED ORDER — VALTOCO 20 MG DOSE 2 X 10 MG/0.1ML NA LQPK: NASAL | 5 refills | Status: DC

## 2024-05-19 NOTE — Progress Notes (Signed)
 "  NEUROLOGY FOLLOW UP OFFICE NOTE  Sandra Collins [id number] 1985-06-26  Discussed the use of AI scribe software for clinical note transcription with the patient, who gave verbal consent to proceed.  History of Present Illness I had the pleasure of seeing Sandra Collins in follow-up in the neurology clinic on 05/19/2024.  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 She is again accompanied by her mother who helps supplement the history today.  Records and images were personally reviewed where available.  Since her last visit, they report a nocturnal convulsion that occurred on 12/4 in the setting of a febrile illness. Her mother heard the change in her breathing, she vocalized with arms flexed and upper body jerking. Her temperature was 101. Her mother gave her lorazepam  and Ibuprofen. She had small seizures off and on all next day. She is on Topiramate  100mg  at bedtime and CBD oil obtained from [facility]. Migraines are on and off, she puts on her copper hat and headaches improve. She has been doing PT for left shoulder pain. They are happy to report that she is doing better cognitively on lower dose Topiramate , she is now able to cook again and beating online games that she previously could not do.     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 Records from Dr. Janit and provided by her mother were reviewed. Seizures started at 4 months of age (10/1986). She was started on Depakote in 07/1989. She underwent EMU monitoring in [place] in 2007 and had left temporal lobe surgery in [place], [place] that was unsuccessful. VNS was placed in 2017. Main trigger noted for her seizures is a temperature of 99 degrees, whether from illness, physical activity, stress, or menstrual period. She started a  CBD schedule in 2020 with her neurologist in [place]  and they were able to wean off Lamotrigine in 2021. She continues on Topiramate  200mg  BID and CBD 70mg  BID. CBD is obtained from [facility] in [place] .  Seizure semiology has included staring with bilateral hand automatisms followed by generalized tonic posturing, and drop attacks. She will have an aura of grabbing on to something then has loss of consciousness. Her mother notes shakiness and pupils dilate. On her last visit with Dr. Janit in 08/2020, they reported her last seizure with loss of consciousness/GTC was in 12/2019, however today report that she had 4 seizures last year with spacing out or behavioral arrest for 15 seconds. She had a seizure yesterday in her sleep occurring on and off from 2:30am to 6am. She almost fell off the bed. She was biting her pillows and teddy bear trying to stay calm. She could feel her body shaking a little bit for 15-30 seconds, occurring repeatedly and waking her up. She did have a low grade temperature and took Ibuprofen and 2mg  lorazepam , with no further events for the rest of the day. She denies any loss of consciousness with the recent seizures. She has rare body jerks. She denies any olfactory/gustatory hallucinations, focal numbness/tingling/weakness. She has rare migraines and takes Ibuprofen with good response. She has some swallowing difficulties due to her thyroid  issues, and back pain due to scoliosis. No bowel/bladder dysfunction. Sleep is good. Mood is alright. She manages her own medications without issues.She lives with her mother and seizure dog. She does not drive.  Epilepsy Risk Factors:  A paternal aunt had epilepsy in childhood, 2 paternal cousins have epilepsy. She had a febrile convulsion in childhood. Otherwise she had a normal birth with no history of CNS infections such as meningitis/encephalitis, significant traumatic brain injury, neurosurgical procedures  Prior ASMs: Lamotrigine,  Sabril, Dilantin, Trileptal, Lyrica, Onfi, Depakote, Zarontin, Felbatol, Neurontin, Tegretol, Phenobarbital  Diagnostic Data: Notes from Dr. Kathrine Bertin at [facility]: During her video EEG evaluation at [facility] in 2007, recorded intermittent slow activity arising from the left temporal region as well as sharp waev arising from the same area (90 percent of discharges) and the right temporal region (10 percent of discharges). We recorded two seizures arising from the left temporal lobe and one seizure arising from the right temporal lobe. MRI brain in 2007 normal, PET scan reported as normal. She underwent depth electrode evaluation at [facility] in [place], implanted for approximately a month and removed on January 2008. She did not have any seizures.    PAST MEDICAL HISTORY: Past Medical History:  Diagnosis Date   Angio-edema    Depression    Epilepsy (HCC)    Headache    Hyperlipidemia    Hypothyroidism    Short-term memory loss    Caused by seizures   Thyroid  cancer (HCC)     MEDICATIONS: Medications Ordered Prior to Encounter[1]  ALLERGIES: Allergies[2]  FAMILY HISTORY: Family History  Problem Relation Age of Onset   Asthma Mother    Allergic rhinitis Mother    Breast cancer Mother    Cancer Mother    Angioedema Neg Hx    Eczema Neg Hx    Urticaria Neg Hx     SOCIAL HISTORY: Social History   Socioeconomic History   Marital status: Single    Spouse name: Not on file   Number of children: 0   Years of education: 12   Highest education level: 12th grade  Occupational History   Occupation: disabled  Tobacco Use   Smoking status: Never    Passive exposure: Never   Smokeless tobacco: Never  Vaping Use   Vaping status: Never Used  Substance and Sexual Activity   Alcohol use: Yes    Comment: rare   Drug use: Never   Sexual activity: Yes    Birth control/protection: Injection  Other Topics Concern   Not on file  Social  History Narrative   Right handed   Lives with mother in a one story home/2025   Caffeine one a day   2 dogs   Social Drivers of Health   Tobacco Use: Low Risk (05/19/2024)   Patient History    Smoking Tobacco Use: Never    Smokeless Tobacco Use: Never    Passive Exposure: Never  Financial Resource Strain: Medium Risk (03/23/2024)   Overall Financial Resource Strain (CARDIA)    Difficulty of Paying Living Expenses: Somewhat hard  Food Insecurity: No Food Insecurity (04/09/2024)   Epic    Worried About Programme Researcher, Broadcasting/film/video in the Last Year: Never true    Ran Out of Food in the Last Year: Never true  Transportation Needs: No Transportation Needs (04/09/2024)   Epic    Lack of Transportation (Medical): No    Lack of Transportation (Non-Medical): No  Physical Activity: Sufficiently Active (04/09/2024)   Exercise Vital Sign    Days of Exercise per Week: 6 days    Minutes of Exercise per Session: 30 min  Recent Concern: Physical Activity - Insufficiently Active (03/23/2024)  Exercise Vital Sign    Days of Exercise per Week: 3 days    Minutes of Exercise per Session: 40 min  Stress: No Stress Concern Present (04/09/2024)   [employer] of Occupational Health - Occupational Stress Questionnaire    Feeling of Stress: Only a little  Social Connections: Moderately Integrated (04/09/2024)   Social Connection and Isolation Panel    Frequency of Communication with Friends and Family: More than three times a week    Frequency of Social Gatherings with Friends and Family: Never    Attends Religious Services: 1 to 4 times per year    Active Member of [employer] or Organizations: Yes    Attends Banker Meetings: 1 to 4 times per year    Marital Status: Never married  Intimate Partner Violence: Patient Unable To Answer (04/09/2024)   Epic    Fear of Current or Ex-Partner: Patient unable to answer    Emotionally Abused: Patient unable to answer    Physically Abused: Patient  unable to answer    Sexually Abused: Patient unable to answer  Depression (PHQ2-9): Low Risk (04/09/2024)   Depression (PHQ2-9)    PHQ-2 Score: 2  Alcohol Screen: Low Risk (03/23/2024)   Alcohol Screen    Last Alcohol Screening Score (AUDIT): 1  Housing: Unknown (04/09/2024)   Epic    Unable to Pay for Housing in the Last Year: No    Number of Times Moved in the Last Year: Not on file    Homeless in the Last Year: No  Utilities: Not At Risk (04/09/2024)   Epic    Threatened with loss of utilities: No  Health Literacy: Inadequate Health Literacy (04/09/2024)   B1300 Health Literacy    Frequency of need for help with medical instructions: Sometimes     PHYSICAL EXAM: Vitals:   05/19/24 1521  BP: (!) 145/93  Pulse: 95  SpO2: 99%   General: No acute distress Head:  Normocephalic/atraumatic Skin/Extremities: No rash, no edema Neurological Exam: alert and awake. No aphasia or dysarthria. Fund of knowledge is appropriate.  Attention and concentration are normal.   Cranial nerves: Pupils equal, round. Extraocular movements intact. Visual fields full.  No facial asymmetry.  Motor: moves all extremities symmetrically at least anti-gravity x 4.  Gait narrow-based and steady, no ataxia.   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 MRI brain did not show any significant intracranial abnormalities. 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 She had one nocturnal convulsion on 12/4 in the setting of febrile illness. She is overall stable on low dose Topiramate  100mg  at bedtime and continues on CBD obtained from [facility] in [place]. They expressed concern about increasing cost and potential issues obtaining this in the future, they were given information about Epidiolex. We discussed having prn Valtoco  for seizure rescue, side effects discussed. She can still use the prn lorazepam  PO for  the clusters in wakefulness, would use the Valtoco  for the nocturnal convulsions. She does not drive. Follow-up in 4 months, call for any changes.    Thank you for allowing me to participate in her care.  Please do not hesitate to call for any questions or concerns.    Darice Shivers, M.D.   CC: Dr. Rollene     [1]  Current Outpatient Medications on File Prior to Visit  Medication Sig Dispense Refill   Ascorbic Acid (VITAMIN C) 1000 MG tablet Take  1,000 mg by mouth daily.     aspirin  EC 81 MG tablet Take 81 mg by mouth daily. Swallow whole.     b complex vitamins capsule Take 1 capsule by mouth daily.     Calcium-Magnesium-Vitamin D  (CALCIUM 1200+D3 PO) Take 1 tablet by mouth 2 (two) times daily.     cholecalciferol (VITAMIN D3) 25 MCG (1000 UNIT) tablet Take 1,000 Units by mouth daily.     Estradiol  10 MCG TABS vaginal tablet Place 1 tablet (10 mcg total) vaginally 2 (two) times a week. 24 tablet 3   ibuprofen (ADVIL) 200 MG tablet Take 400 mg by mouth every 6 (six) hours as needed for headache.     levothyroxine  (SYNTHROID ) 50 MCG tablet Take 1 tablet (50 mcg total) by mouth daily before breakfast. 90 tablet 3   LORazepam  (ATIVAN ) 2 MG tablet Take 1 tablet (2 mg total) by mouth daily as needed for seizure. 10 tablet 5   medroxyPROGESTERone  Acetate 150 MG/ML SUSY Inject 1 mL (150 mg total) into the muscle every 3 (three) months. 1 mL 3   Melatonin 10 MG TABS Take 10 mg by mouth at bedtime.     Multiple Vitamins-Minerals (MULTIVITAMIN WITH MINERALS) tablet Take 1 tablet by mouth daily.     NON FORMULARY Place 25 mg under the tongue daily. CBD oil     Omega-3 Fatty Acids (FISH OIL) 1200 MG CPDR Take 1,200 mg by mouth daily.     topiramate  (TOPAMAX ) 100 MG tablet Take 1 tablet every night 90 tablet 3   vitamin B-12 (CYANOCOBALAMIN) 1000 MCG tablet Take 1,000 mcg by mouth daily.     No current facility-administered medications on file prior to visit.  [2]  Allergies Allergen  Reactions   Estrogens Other (See Comments)    Seizures   Tape Hives, Itching, Swelling and Rash    Must use paper tape.    Amoxicillin Hives   Latex Hives   Metronidazole Hives   Pregabalin Other (See Comments)    Excessive weight gain     "

## 2024-05-19 NOTE — Patient Instructions (Signed)
 Access Code: [email or web address] URL: [email or web address] Date: 05/19/2024 Prepared by: Elaine Daring  Exercises - Step Back Shoulder Stretch with Chair  - 1 x daily - 2 sets - 5 reps - 5 seconds hold - Scaption with Dumbbells  - 1 x daily - 3 sets - 10 reps - Push-Up on Counter  - 1 x daily - 3 sets - 10 reps - Standing Row with Anchored Resistance  - 1 x daily - 3 sets - 10 reps - Shoulder External Rotation with Anchored Resistance  - 1 x daily - 3 sets - 10 reps - Standing Shoulder Internal Rotation with Anchored Resistance  - 1 x daily - 3 sets - 10 reps - Standing Shoulder Horizontal Abduction with Resistance  - 1 x daily - 3 sets - 10 reps - Standing Shoulder Diagonal Horizontal Abduction 60/120 Degrees with Resistance  - 1 x daily - 3 sets - 10 reps

## 2024-05-19 NOTE — Patient Instructions (Signed)
 It's always a pleasure to see you.  Continue all your medications  2. Prescription was sent for as needed Valtoco  nasal spray for seizure rescue  3. Follow-up in 4 months, call for any changes    Seizure Precautions: 1. If medication has been prescribed for you to prevent seizures, take it exactly as directed.  Do not stop taking the medicine without talking to your doctor first, even if you have not had a seizure in a long time.   2. Avoid activities in which a seizure would cause danger to yourself or to others.  Don't operate dangerous machinery, swim alone, or climb in high or dangerous places, such as on ladders, roofs, or girders.  Do not drive unless your doctor says you may.  3. If you have any warning that you may have a seizure, lay down in a safe place where you can't hurt yourself.    4.  No driving for 6 months from last seizure, as per [place]  state law.   Please refer to the following link on the [employer]'s website for more information: http://www.epilepsyfoundation.org/answerplace/Social/driving/drivingu.cfm   5.  Maintain good sleep hygiene.  6.  Contact your doctor if you have any problems that may be related to the medicine you are taking.  7.  Call 911 and bring the patient back to the ED if:        A.  The seizure lasts longer than 5 minutes.       B.  The patient doesn't awaken shortly after the seizure  C.  The patient has new problems such as difficulty seeing, speaking or moving  D.  The patient was injured during the seizure  E.  The patient has a temperature over 102 F (39C)  F.  The patient vomited and now is having trouble breathing

## 2024-05-19 NOTE — Therapy (Signed)
 " OUTPATIENT PHYSICAL THERAPY TREATMENT   Patient Name: Sandra Collins MRN: [id number] DOB:1986-04-23, 38 y.o., female Today's Date: 05/19/2024   END OF SESSION:  PT End of Session - 05/19/24 0958     Visit Number 4    Number of Visits 9    Date for Recertification  06/24/24    Authorization Type BCBS MCR    Progress Note Due on Visit 10    PT Start Time 0931    PT Stop Time 1010    PT Time Calculation (min) 39 min    Activity Tolerance Patient tolerated treatment well    Behavior During Therapy WFL for tasks assessed/performed             Past Medical History:  Diagnosis Date   Angio-edema    Depression    Epilepsy (HCC)    Headache    Hyperlipidemia    Hypothyroidism    Short-term memory loss    Caused by seizures   Thyroid  cancer (HCC)    Past Surgical History:  Procedure Laterality Date   BRAIN SURGERY  05/03/2006   IMPLANTATION VAGAL NERVE STIMULATOR  09/28/2015   IR 3D INDEPENDENT WKST  07/03/2021   IR ANGIO INTRA EXTRACRAN SEL INTERNAL CAROTID BILAT MOD SED  07/03/2021   IR ANGIO VERTEBRAL SEL VERTEBRAL UNI R MOD SED  07/03/2021   IR CT HEAD LTD  07/03/2021   IR RADIOLOGIST EVAL & MGMT  06/12/2021   IR RADIOLOGIST EVAL & MGMT  07/25/2021   IR TRANSCATH/EMBOLIZ  07/03/2021   RADIOLOGY WITH ANESTHESIA N/A 07/03/2021   Procedure: Cerebral angiogram with intent to embolize brain aneurysm;  Surgeon: Dolphus Carrion, MD;  Location: [facility] OR;  Service: Radiology;  Laterality: N/A;   THYROID  SURGERY     cancer   VAGUS NERVE STIMULATOR INSERTION Left 09/25/2021   Procedure: Vagus Nerve Stimulator  Battery Change;  Surgeon: Lanis Pupa, MD;  Location: [facility] OR;  Service: Neurosurgery;  Laterality: Left;   Patient Active Problem List   Diagnosis Date Noted   Status post VNS (vagus nerve stimulator) placement 04/07/2024   Chronic left shoulder pain 03/27/2024   Environmental allergies 07/04/2023   Dysphagia 08/20/2022   Routine general medical examination at a  health care facility 02/19/2022   Birth control counseling 08/01/2021   Aneurysm, 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   Scoliosis 05/02/2021   Cerebral aneurysm 05/02/2021   Obesity 05/02/2021   Family history of breast cancer 05/02/2021   Anxiety about health 04/26/2021   Elevated LDL cholesterol level 09/06/2020   Hypothyroidism 09/06/2020   Seizure (HCC) 09/06/2020   Papillary thyroid  carcinoma (HCC) 02/23/2020   Vitamin D  deficiency 02/23/2020    PCP: Rollene Almarie LABOR, MD  REFERRING PROVIDER: Joane Artist RAMAN, MD  REFERRING DIAG: Chronic left shoulder pain  THERAPY DIAG:  Chronic left shoulder pain  Muscle weakness (generalized)  Rationale for Evaluation and Treatment: Rehabilitation  ONSET DATE: May 2025   SUBJECTIVE:        SUBJECTIVE STATEMENT: Patient reports she is doing well, the exercises are getting very easy.  Eval: Patient reports back in May she was walking a dog on a leash and the dog pulled on the leash which pulled on the left shoulder. She also has been having to do more of the heavy lifting at home. She states she is unable to sleep on the left shoulder. She has been putting CBD cream on the left shoulder which has helped with the sleeping. Whenever she holds the arm arm  out more than 3 seconds then the pain will increase and rarely will have pain down the left arm. She used to go to the gym but has not since the shoulder injury. She denies any clicking/popping. She reports occasional and rare numbness/tingling in left arm that is primarily with sleeping but also when she tired to do a push-up.   Hand dominance: Right  PERTINENT HISTORY: See PMH above  PAIN:  Are you having pain? Yes:  NPRS scale: 0/10 pain, 7/10 at worst Pain location: Left shoulder  Pain description: ripping Aggravating factors: Raising the arm out to the side Relieving factors: CBD cream  PRECAUTIONS: None  PATIENT GOALS: Left shoulder pain relief and return to gym  exercise   OBJECTIVE:  Note: Objective measures were completed at Evaluation unless otherwise noted. PATIENT SURVEYS:  PSFS: 2.67 Sleeping: 3 Raising left arm for more than 3 seconds: 3 Putting arm overhead: 2  POSTURE: Rounded shoulder and forward head posture  UPPER EXTREMITY ROM:   Active ROM Right eval Left eval Left 05/05/2024  Shoulder flexion 145 75 145  Shoulder extension     Shoulder abduction 140 65   Shoulder adduction     Shoulder internal rotation T8 T10   Shoulder external rotation 65 / T2 45 / Occiput   Elbow flexion     Elbow extension     Wrist flexion     Wrist extension     Wrist ulnar deviation     Wrist radial deviation     Wrist pronation     Wrist supination     (Blank rows = not tested)  Patient exhibits left shoulder PROM better than AROM but still limited with empty end feels and pain at end ranges  UPPER EXTREMITY MMT:  MMT Right eval Left eval  Shoulder flexion    Shoulder extension    Shoulder abduction    Shoulder adduction    Shoulder internal rotation 5 5  Shoulder external rotation 5 4  Middle trapezius    Lower trapezius    Elbow flexion    Elbow extension    Wrist flexion    Wrist extension    Wrist ulnar deviation    Wrist radial deviation    Wrist pronation    Wrist supination    Grip strength (lbs)    (Blank rows = not tested)  JOINT MOBILITY TESTING:  Not formally assessed  PALPATION:  Generalized tenderness of left shoulder and posterior cuff                                                                                                                             TREATMENT  [facility] Adult PT Treatment:  DATE: 05/19/2024 UBE L3 x 4 min (fwd/bwd) to improve endurance and workload capacity Standing banded ER/IR with red 2 x 10 each Push-up from elevated table 3 x 10 Seated overhead shoulder press with 4# 3 x 10 Standing horizontal abduction with green 2 x  10 Standing diagonals with green 2 x 10 Standing scaption with 4# 3 x 10 Bent over row on table with 15# 3 x 10 each - small towel roll under right wrist Row with black x 10  PATIENT EDUCATION: Education details: HEP update Person educated: Patient and mother Education method: Explanation, Demonstration, Tactile cues, Verbal cues, and Handouts Education comprehension: verbalized understanding, returned demonstration, verbal cues required, tactile cues required, and needs further education  HOME EXERCISE PROGRAM: Access Code: [email or web address]    ASSESSMENT: CLINICAL IMPRESSION: Patient tolerated therapy well with no adverse effects. Therapy continued progression of shoulder strengthening with good tolerance. She was able to progress with her banded shoulder exercises and began working on overhead pressing with good tolerance. She is progressing well in therapy and denies any increase in shoulder pain with exercises. No changes to specific HEP exercises but did increase difficulty and resistance for exercises at home. Patient would benefit from continued skilled PT to progress mobility and strength in order to reduce pain and maximize functional ability.   Eval: Patient is a 38 y.o. female who was seen today for physical therapy evaluation and treatment for chronic left shoulder pain. Her left shoulder symptoms are consistent with rotator cuff related pain and she exhibits limitations in active > passive range of motion and strength deficits of the left shoulder. Did not formally assess shoulder flexion/abduction strength as these were painful with active motion and obviously weak. Provided patient with exercises focused on stretching and gravity reduced active motion for the left shoulder and rotator cuff. Patient would benefit from continued skilled PT to progress mobility and strength of the left shoulder in order to reduce pain and maximize functional ability.    OBJECTIVE IMPAIRMENTS: decreased  activity tolerance, decreased ROM, decreased strength, postural dysfunction, and pain.   ACTIVITY LIMITATIONS: carrying, lifting, sleeping, bathing, dressing, reach over head, and hygiene/grooming  PARTICIPATION LIMITATIONS: meal prep, cleaning, and community activity  PERSONAL FACTORS: Fitness, Past/current experiences, and Time since onset of injury/illness/exacerbation are also affecting patient's functional outcome.    GOALS: Goals reviewed with patient? Yes  SHORT TERM GOALS: Target date: 05/27/2024  Patient will be I with initial HEP in order to progress with therapy. Baseline: HEP provided at eval Goal status: INITIAL  2.  Patient will report left shoulder pain with active movement overhead </= 5/10 in order to reduce functional limitations Baseline: 7/10 Goal status: INITIAL  LONG TERM GOALS: Target date: 06/24/2024  Patient will be I with final HEP to maintain progress from PT. Baseline: HEP provided at eval Goal status: INITIAL  2.  Patient will report PSFS >/= 7 in order to indicate improvement in their functional ability. Baseline: 2.67 Goal status: INITIAL  3.  Patient will demonstrate left shoulder AROM grossly WFL / equal to opposite side in order to improve overhead reach and self care tasks Baseline: see limitations above Goal status: INITIAL  4.  Patient will demonstrate left shoulder strength >/= 4+/5 MMT in order to improve activity tolerance and return to gym exercise without limitation Baseline: see limitations above Goal status: INITIAL  5. Patient will report left shoulder pain with activity </= 2/10 in order to reduce functional limitations Baseline: 7/10 Goal status: INITIAL  PLAN: PT FREQUENCY: 1x/week  PT DURATION: 8 weeks  PLANNED INTERVENTIONS: 97164- PT Re-evaluation, 97750- Physical Performance Testing, 97110-Therapeutic exercises, 97530- Therapeutic activity, 97112- Neuromuscular re-education, 97535- Self Care, 02859- Manual therapy,  20560 (1-2 muscles), 20561 (3+ muscles)- Dry Needling, Patient/Family education, Taping, Joint mobilization, Joint manipulation, Spinal manipulation, Spinal mobilization, Cryotherapy, and Moist heat  PLAN FOR NEXT SESSION: Review HEP and progress PRN, manual for left shoulder mobility, progress left shoulder PROM>AAROM>AROM as tolerated, left shoulder strengthening isometric>isotonic exercises as tolerated   Elaine Daring, PT, DPT, LAT, ATC 05/19/2024  12:07 PM Phone: [phone] Fax: [phone]   "

## 2024-05-22 ENCOUNTER — Telehealth: Payer: Self-pay

## 2024-05-22 ENCOUNTER — Telehealth: Payer: Self-pay | Admitting: Neurology

## 2024-05-22 NOTE — Telephone Encounter (Signed)
 Pt called to see if there is a discount card for the nasal spray Dr.Aquino put her on. She is at pharmacy now and it is going to cost her 1200.00. she would like a call back asap

## 2024-05-22 NOTE — Telephone Encounter (Signed)
 Pt needs a PA for her Valtoco  thank you

## 2024-05-22 NOTE — Telephone Encounter (Signed)
 Spoke with Sandra Collins they did try to use a copay card but because she has medicare she could not use it I told her we are working on getting a PA it will take a few days and I will call her back ,

## 2024-05-22 NOTE — Telephone Encounter (Signed)
 I called [employer] she said that she thinks that it will need a PA I sent a message to the PA team as urgent. We will try discount card

## 2024-05-25 ENCOUNTER — Encounter: Payer: Self-pay | Admitting: Physical Therapy

## 2024-05-25 ENCOUNTER — Other Ambulatory Visit (HOSPITAL_COMMUNITY): Payer: Self-pay

## 2024-05-25 ENCOUNTER — Ambulatory Visit (INDEPENDENT_AMBULATORY_CARE_PROVIDER_SITE_OTHER): Admitting: Physical Therapy

## 2024-05-25 ENCOUNTER — Other Ambulatory Visit: Payer: Self-pay

## 2024-05-25 DIAGNOSIS — G8929 Other chronic pain: Secondary | ICD-10-CM | POA: Diagnosis not present

## 2024-05-25 DIAGNOSIS — M25512 Pain in left shoulder: Secondary | ICD-10-CM | POA: Diagnosis not present

## 2024-05-25 DIAGNOSIS — M6281 Muscle weakness (generalized): Secondary | ICD-10-CM

## 2024-05-25 NOTE — Telephone Encounter (Signed)
 tonya that works with [employer] said that for sara to wait until Jan to apply for the [employer] program with medicare part D so she can get all the benefit from it and use that with her valtoco  to help lower the price and that there is also a extra help program at the social security office she can apply for to see if that will help

## 2024-05-25 NOTE — Telephone Encounter (Signed)
 PA is not required. This is the cost of the medication with insurance.    Pharmacy APW#[id number] ERW:[id number] GRP: [id number]

## 2024-05-25 NOTE — Therapy (Signed)
 " OUTPATIENT PHYSICAL THERAPY TREATMENT   Patient Name: Sandra Collins MRN: [id number] DOB:21-Sep-1985, 38 y.o., female Today's Date: 05/25/2024   END OF SESSION:  PT End of Session - 05/25/24 1019     Visit Number 5    Number of Visits 9    Date for Recertification  06/24/24    Authorization Type BCBS MCR    Progress Note Due on Visit 10    PT Start Time 1017    PT Stop Time 1055    PT Time Calculation (min) 38 min    Activity Tolerance Patient tolerated treatment well    Behavior During Therapy WFL for tasks assessed/performed              Past Medical History:  Diagnosis Date   Angio-edema    Depression    Epilepsy (HCC)    Headache    Hyperlipidemia    Hypothyroidism    Short-term memory loss    Caused by seizures   Thyroid  cancer (HCC)    Past Surgical History:  Procedure Laterality Date   BRAIN SURGERY  05/03/2006   IMPLANTATION VAGAL NERVE STIMULATOR  09/28/2015   IR 3D INDEPENDENT WKST  07/03/2021   IR ANGIO INTRA EXTRACRAN SEL INTERNAL CAROTID BILAT MOD SED  07/03/2021   IR ANGIO VERTEBRAL SEL VERTEBRAL UNI R MOD SED  07/03/2021   IR CT HEAD LTD  07/03/2021   IR RADIOLOGIST EVAL & MGMT  06/12/2021   IR RADIOLOGIST EVAL & MGMT  07/25/2021   IR TRANSCATH/EMBOLIZ  07/03/2021   RADIOLOGY WITH ANESTHESIA N/A 07/03/2021   Procedure: Cerebral angiogram with intent to embolize brain aneurysm;  Surgeon: Dolphus Carrion, MD;  Location: [facility] OR;  Service: Radiology;  Laterality: N/A;   THYROID  SURGERY     cancer   VAGUS NERVE STIMULATOR INSERTION Left 09/25/2021   Procedure: Vagus Nerve Stimulator  Battery Change;  Surgeon: Lanis Pupa, MD;  Location: [facility] OR;  Service: Neurosurgery;  Laterality: Left;   Patient Active Problem List   Diagnosis Date Noted   Status post VNS (vagus nerve stimulator) placement 04/07/2024   Chronic left shoulder pain 03/27/2024   Environmental allergies 07/04/2023   Dysphagia 08/20/2022   Routine general medical examination at a  health care facility 02/19/2022   Birth control counseling 08/01/2021   Aneurysm, 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   Scoliosis 05/02/2021   Cerebral aneurysm 05/02/2021   Obesity 05/02/2021   Family history of breast cancer 05/02/2021   Anxiety about health 04/26/2021   Elevated LDL cholesterol level 09/06/2020   Hypothyroidism 09/06/2020   Seizure (HCC) 09/06/2020   Papillary thyroid  carcinoma (HCC) 02/23/2020   Vitamin D  deficiency 02/23/2020    PCP: Rollene Almarie LABOR, MD  REFERRING PROVIDER: Joane Artist RAMAN, MD  REFERRING DIAG: Chronic left shoulder pain  THERAPY DIAG:  Chronic left shoulder pain  Muscle weakness (generalized)  Rationale for Evaluation and Treatment: Rehabilitation  ONSET DATE: May 2025   SUBJECTIVE:        SUBJECTIVE STATEMENT: Patient reports her shoulder has been feeling great, denies any pain. The exercises have been fine without any issue.   Eval: Patient reports back in May she was walking a dog on a leash and the dog pulled on the leash which pulled on the left shoulder. She also has been having to do more of the heavy lifting at home. She states she is unable to sleep on the left shoulder. She has been putting CBD cream on the left shoulder which has helped  with the sleeping. Whenever she holds the arm arm out more than 3 seconds then the pain will increase and rarely will have pain down the left arm. She used to go to the gym but has not since the shoulder injury. She denies any clicking/popping. She reports occasional and rare numbness/tingling in left arm that is primarily with sleeping but also when she tired to do a push-up.   Hand dominance: Right  PERTINENT HISTORY: See PMH above  PAIN:  Are you having pain? Yes:  NPRS scale: 0/10 pain, 7/10 at worst Pain location: Left shoulder  Pain description: ripping Aggravating factors: Raising the arm out to the side Relieving factors: CBD cream  PRECAUTIONS: None  PATIENT GOALS: Left  shoulder pain relief and return to gym exercise   OBJECTIVE:  Note: Objective measures were completed at Evaluation unless otherwise noted. PATIENT SURVEYS:  PSFS: 2.67 Sleeping: 3 Raising left arm for more than 3 seconds: 3 Putting arm overhead: 2  POSTURE: Rounded shoulder and forward head posture  UPPER EXTREMITY ROM:   Active ROM Right eval Left eval Left 05/05/2024  Shoulder flexion 145 75 145  Shoulder extension     Shoulder abduction 140 65   Shoulder adduction     Shoulder internal rotation T8 T10   Shoulder external rotation 65 / T2 45 / Occiput   Elbow flexion     Elbow extension     Wrist flexion     Wrist extension     Wrist ulnar deviation     Wrist radial deviation     Wrist pronation     Wrist supination     (Blank rows = not tested)  Patient exhibits left shoulder PROM better than AROM but still limited with empty end feels and pain at end ranges  UPPER EXTREMITY MMT:  MMT Right eval Left eval  Shoulder flexion    Shoulder extension    Shoulder abduction    Shoulder adduction    Shoulder internal rotation 5 5  Shoulder external rotation 5 4  Middle trapezius    Lower trapezius    Elbow flexion    Elbow extension    Wrist flexion    Wrist extension    Wrist ulnar deviation    Wrist radial deviation    Wrist pronation    Wrist supination    Grip strength (lbs)    (Blank rows = not tested)  JOINT MOBILITY TESTING:  Not formally assessed  PALPATION:  Generalized tenderness of left shoulder and posterior cuff                                                                                                                             TREATMENT  [facility] Adult PT Treatment:  DATE: 05/25/2024 UBE L3 x 4 min (fwd/bwd) to improve endurance and workload capacity Bent over row on table with 20# 3 x 6-10 reps each - small towel roll under right wrist Seated overhead shoulder press with 6# 2 x 10 Row  with black 3 x 10 Push-up from elevated table 3 x 10 Seated horizontal abduction with green x 10 Seated double ER and scap retraction with green 2 x 10 Standing wall ball circles cw/ccw 2 x 10 each  PATIENT EDUCATION: Education details: HEP Person educated: Patient and mother Education method: Explanation, Demonstration, Tactile cues, Verbal cues Education comprehension: verbalized understanding, returned demonstration, verbal cues required, tactile cues required, and needs further education  HOME EXERCISE PROGRAM: Access Code: [email or web address]    ASSESSMENT: CLINICAL IMPRESSION: Patient tolerated therapy well with no adverse effects. Therapy focused on continued strengthening for her shoulder with good tolerance. She denies any pain or limitation with her left shoulder while performing the exercises or with any daily activities at this time. The plan is for patient to transition to gym exercises and will follow-up in PT if any issues arise or pain increases with her transition to the gym. No changes made to her HEP this visit. Patient would benefit from continued skilled PT to progress mobility and strength in order to reduce pain and maximize functional ability.   Eval: Patient is a 38 y.o. female who was seen today for physical therapy evaluation and treatment for chronic left shoulder pain. Her left shoulder symptoms are consistent with rotator cuff related pain and she exhibits limitations in active > passive range of motion and strength deficits of the left shoulder. Did not formally assess shoulder flexion/abduction strength as these were painful with active motion and obviously weak. Provided patient with exercises focused on stretching and gravity reduced active motion for the left shoulder and rotator cuff. Patient would benefit from continued skilled PT to progress mobility and strength of the left shoulder in order to reduce pain and maximize functional ability.    OBJECTIVE IMPAIRMENTS:  decreased activity tolerance, decreased ROM, decreased strength, postural dysfunction, and pain.   ACTIVITY LIMITATIONS: carrying, lifting, sleeping, bathing, dressing, reach over head, and hygiene/grooming  PARTICIPATION LIMITATIONS: meal prep, cleaning, and community activity  PERSONAL FACTORS: Fitness, Past/current experiences, and Time since onset of injury/illness/exacerbation are also affecting patient's functional outcome.    GOALS: Goals reviewed with patient? Yes  SHORT TERM GOALS: Target date: 05/27/2024  Patient will be I with initial HEP in order to progress with therapy. Baseline: HEP provided at eval 05/25/2024: independent Goal status: MET  2.  Patient will report left shoulder pain with active movement overhead </= 5/10 in order to reduce functional limitations Baseline: 7/10 05/25/2024: denies pain Goal status: MET  LONG TERM GOALS: Target date: 06/24/2024  Patient will be I with final HEP to maintain progress from PT. Baseline: HEP provided at eval Goal status: INITIAL  2.  Patient will report PSFS >/= 7 in order to indicate improvement in their functional ability. Baseline: 2.67 Goal status: INITIAL  3.  Patient will demonstrate left shoulder AROM grossly WFL / equal to opposite side in order to improve overhead reach and self care tasks Baseline: see limitations above 05/25/2024: left shoulder AROM grossly WFL Goal status: MET  4.  Patient will demonstrate left shoulder strength >/= 4+/5 MMT in order to improve activity tolerance and return to gym exercise without limitation Baseline: see limitations above Goal status: INITIAL  5. Patient will report left shoulder pain  with activity </= 2/10 in order to reduce functional limitations Baseline: 7/10 05/25/2024: denies pain Goal status: MET   PLAN: PT FREQUENCY: 1x/week  PT DURATION: 8 weeks  PLANNED INTERVENTIONS: 97164- PT Re-evaluation, 97750- Physical Performance Testing, 97110-Therapeutic  exercises, 97530- Therapeutic activity, 97112- Neuromuscular re-education, 97535- Self Care, 02859- Manual therapy, 20560 (1-2 muscles), 20561 (3+ muscles)- Dry Needling, Patient/Family education, Taping, Joint mobilization, Joint manipulation, Spinal manipulation, Spinal mobilization, Cryotherapy, and Moist heat  PLAN FOR NEXT SESSION: Review HEP and progress PRN, manual for left shoulder mobility, progress left shoulder PROM>AAROM>AROM as tolerated, left shoulder strengthening isometric>isotonic exercises as tolerated   Elaine Daring, PT, DPT, LAT, ATC 05/25/2024  11:29 AM Phone: [phone] Fax: [phone]   "

## 2024-05-25 NOTE — Telephone Encounter (Signed)
 Can you pls let Laurier know about this and let her know patient cannot afford $1,226. Thanks

## 2024-05-25 NOTE — Telephone Encounter (Signed)
 Reps called [employer] and Tonya called for help with Valtoco . Tonya's number is [phone]

## 2024-05-26 ENCOUNTER — Telehealth: Payer: Self-pay | Admitting: Neurology

## 2024-05-26 NOTE — Telephone Encounter (Signed)
 See other phone note

## 2024-05-26 NOTE — Telephone Encounter (Signed)
 Thanks, pls let her/mom know. Hopefully they can do that next week. Thanks

## 2024-05-26 NOTE — Telephone Encounter (Signed)
 Pt called in this morning and she stated that she is still waiting to see what is going on with the Valtoco  since she can not afford it. Is there a program she can get on to help with the cost of the Valtoco ? Please call. Thanks

## 2024-05-26 NOTE — Telephone Encounter (Signed)
 Pt called and spoke with her and her mom and informed them about the [employer] program they are going to apply. They stated they tried the extra help program and that she make to much money for it,

## 2024-06-02 ENCOUNTER — Telehealth: Payer: Self-pay

## 2024-06-02 NOTE — Telephone Encounter (Signed)
 Pt called in today about her valtoco , pt said that Dr Georjean would have to call [facility] to say why she needed the medication call [phone] and she would need it changed from a tier 5 to a tier 4 medication. I was transferred to [phone] where I had to leave a message they will call back by the end of the day

## 2024-06-03 ENCOUNTER — Encounter: Payer: Self-pay | Admitting: Internal Medicine

## 2024-06-03 ENCOUNTER — Ambulatory Visit (INDEPENDENT_AMBULATORY_CARE_PROVIDER_SITE_OTHER): Admitting: Internal Medicine

## 2024-06-03 VITALS — BP 120/80 | HR 107 | Temp 98.3°F | Ht 63.0 in | Wt 202.4 lb

## 2024-06-03 DIAGNOSIS — R928 Other abnormal and inconclusive findings on diagnostic imaging of breast: Secondary | ICD-10-CM

## 2024-06-03 DIAGNOSIS — I671 Cerebral aneurysm, nonruptured: Secondary | ICD-10-CM

## 2024-06-03 DIAGNOSIS — R569 Unspecified convulsions: Secondary | ICD-10-CM

## 2024-06-03 DIAGNOSIS — Z85850 Personal history of malignant neoplasm of thyroid: Secondary | ICD-10-CM | POA: Diagnosis not present

## 2024-06-03 DIAGNOSIS — Z0000 Encounter for general adult medical examination without abnormal findings: Secondary | ICD-10-CM | POA: Diagnosis not present

## 2024-06-03 DIAGNOSIS — E559 Vitamin D deficiency, unspecified: Secondary | ICD-10-CM | POA: Diagnosis not present

## 2024-06-03 DIAGNOSIS — E890 Postprocedural hypothyroidism: Secondary | ICD-10-CM

## 2024-06-03 LAB — VITAMIN D 25 HYDROXY (VIT D DEFICIENCY, FRACTURES): VITD: 30.95 ng/mL (ref 30.00–100.00)

## 2024-06-03 LAB — COMPREHENSIVE METABOLIC PANEL WITH GFR
ALT: 27 U/L (ref 3–35)
AST: 17 U/L (ref 5–37)
Albumin: 4.6 g/dL (ref 3.5–5.2)
Alkaline Phosphatase: 59 U/L (ref 39–117)
BUN: 12 mg/dL (ref 6–23)
CO2: 23 meq/L (ref 19–32)
Calcium: 9.1 mg/dL (ref 8.4–10.5)
Chloride: 107 meq/L (ref 96–112)
Creatinine, Ser: 1.04 mg/dL (ref 0.40–1.20)
GFR: 69.27 mL/min
Glucose, Bld: 80 mg/dL (ref 70–99)
Potassium: 3.5 meq/L (ref 3.5–5.1)
Sodium: 139 meq/L (ref 135–145)
Total Bilirubin: 0.5 mg/dL (ref 0.2–1.2)
Total Protein: 7.7 g/dL (ref 6.0–8.3)

## 2024-06-03 LAB — LIPID PANEL
Cholesterol: 214 mg/dL — ABNORMAL HIGH (ref 28–200)
HDL: 51.8 mg/dL
LDL Cholesterol: 136 mg/dL — ABNORMAL HIGH (ref 10–99)
NonHDL: 162.62
Total CHOL/HDL Ratio: 4
Triglycerides: 135 mg/dL (ref 10.0–149.0)
VLDL: 27 mg/dL (ref 0.0–40.0)

## 2024-06-03 LAB — CBC
HCT: 44.4 % (ref 36.0–46.0)
Hemoglobin: 14.7 g/dL (ref 12.0–15.0)
MCHC: 33.2 g/dL (ref 30.0–36.0)
MCV: 91.9 fl (ref 78.0–100.0)
Platelets: 250 K/uL (ref 150.0–400.0)
RBC: 4.83 Mil/uL (ref 3.87–5.11)
RDW: 12.9 % (ref 11.5–15.5)
WBC: 6.5 K/uL (ref 4.0–10.5)

## 2024-06-03 LAB — TSH: TSH: 1.58 u[IU]/mL (ref 0.35–5.50)

## 2024-06-03 LAB — HEMOGLOBIN A1C: Hgb A1c MFr Bld: 5.4 % (ref 4.6–6.5)

## 2024-06-03 LAB — VITAMIN B12: Vitamin B-12: 1384 pg/mL — ABNORMAL HIGH (ref 211–911)

## 2024-06-03 LAB — T4, FREE: Free T4: 0.98 ng/dL (ref 0.60–1.60)

## 2024-06-03 NOTE — Patient Instructions (Signed)
 We will wait until the oral wegovy is out next week.  We will check the labs today and get the mammogram to [facility].

## 2024-06-03 NOTE — Progress Notes (Signed)
" ° °  Subjective:   Patient ID: Sandra Collins, female    DOB: 1985/07/07, 39 y.o.   MRN: [id number]  The patient is here for physical. Pertinent topics discussed: Discussed the use of AI scribe software for clinical note transcription with the patient, who gave verbal consent to proceed.  History of Present Illness Sandra Collins is a 39 year old female who presents for consultation regarding weight loss medications and management of current medications.  She is interested in weight loss medications, particularly the new oral form of Wegovy. She is concerned about potential side effects, such as nausea and constipation, and the impact on her current medications.  She is currently taking 25 mg of CBD daily from a 10,000 mg strength bottle. There is concern about potential interactions with other medications, such as Airborne, due to CBD's blood-thinning properties. 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  She is on 100 mg of topiramate  daily and experiences small seizures related to her gynecological cycle, particularly before using an estrogen stick. She follows a regimen of using the estrogen stick every three days, but experiences seizures on the fourth day if the schedule is not adhered to. She experiences cramping as a signal for when to use the estrogen stick.  She has a mammogram due but had a negative experiences at the breast center multiple times, leading her to seek alternative locations for this procedure.   PMH, [facility], social history reviewed and updated  Review of Systems  Constitutional: Negative.   HENT: Negative.    Eyes: Negative.   Respiratory:  Negative for cough, chest tightness and shortness of breath.   Cardiovascular:  Negative for chest pain, palpitations and leg swelling.  Gastrointestinal:  Negative for abdominal distention, abdominal pain, constipation,  diarrhea, nausea and vomiting.  Musculoskeletal: Negative.   Skin: Negative.   Neurological:  Positive for seizures.  Psychiatric/Behavioral: Negative.      Objective:  Physical Exam Constitutional:      Appearance: She is well-developed.  HENT:     Head: Normocephalic and atraumatic.  Cardiovascular:     Rate and Rhythm: Normal rate and regular rhythm.  Pulmonary:     Effort: Pulmonary effort is normal. No respiratory distress.     Breath sounds: Normal breath sounds. No wheezing or rales.  Abdominal:     General: Bowel sounds are normal. There is no distension.     Palpations: Abdomen is soft.     Tenderness: There is no abdominal tenderness.  Musculoskeletal:     Cervical back: Normal range of motion.  Skin:    General: Skin is warm and dry.  Neurological:     Mental Status: She is alert and oriented to person, place, and time.     Coordination: Coordination normal.     Vitals:   06/03/24 0818  BP: 120/80  Pulse: (!) 107  Temp: 98.3 F (36.8 C)  TempSrc: Oral  SpO2: 98%  Weight: 202 lb 6.4 oz (91.8 kg)  Height: 5' 3 (1.6 m)    Assessment & Plan:    "

## 2024-06-03 NOTE — Progress Notes (Deleted)
" ° °  Subjective:   Patient ID: Sandra Collins, female    DOB: 13-Feb-1986, 39 y.o.   MRN: [id number]  Discussed the use of AI scribe software for clinical note transcription with the patient, who gave verbal consent to proceed.  History of Present Illness      Review of Systems  Objective:  Physical Exam  There were no vitals filed for this visit.  Assessment and Plan Assessment & Plan    "

## 2024-06-04 DIAGNOSIS — R928 Other abnormal and inconclusive findings on diagnostic imaging of breast: Secondary | ICD-10-CM | POA: Insufficient documentation

## 2024-06-04 NOTE — Assessment & Plan Note (Signed)
 BMI 35.8 and complicated by anxiety. She would like to start oral wegovy and will wait until we are able to prescribe through our EHR.

## 2024-06-04 NOTE — Assessment & Plan Note (Signed)
Checking TSH and free T4.  

## 2024-06-04 NOTE — Assessment & Plan Note (Signed)
 Checking vitamin D  and adjust as needed.

## 2024-06-04 NOTE — Assessment & Plan Note (Signed)
 Flu shot yearly. Tetanus up to date. Mammogram due ordered, pap smear up to date. Counseled about sun safety and mole surveillance. Counseled about the dangers of distracted driving. Given 10 year screening recommendations.

## 2024-06-04 NOTE — Assessment & Plan Note (Signed)
 Continue aspirin  81 mg daily for life.

## 2024-06-04 NOTE — Assessment & Plan Note (Signed)
 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 S/P surgery.

## 2024-06-04 NOTE — Assessment & Plan Note (Signed)
 Due for follow up diagnostic and ordered to [facility] given prior poor experience at the breast center.

## 2024-06-04 NOTE — Assessment & Plan Note (Signed)
 Checking lipid panel and continue aspirin  81 mg daily.

## 2024-06-04 NOTE — Assessment & Plan Note (Signed)
 Recent seizure due to fever with illness. She is working on getting intranasal medication and uses oral for smaller seizures. She does not drive and taking cbd and topiramate  for prevention and has brain stimulator.

## 2024-06-05 ENCOUNTER — Ambulatory Visit: Payer: Self-pay | Admitting: Internal Medicine

## 2024-06-16 ENCOUNTER — Telehealth: Payer: Self-pay

## 2024-06-16 NOTE — Telephone Encounter (Signed)
 Copied from CRM #[id number]. Topic: Clinical - Medication Question >> Jun 16, 2024  9:37 AM Berwyn MATSU wrote: Reason for CRM: Patient called in to follow up on weight loss medication. Per patient the oral medication for wegovy . Patient is requesting a call back.   May you please assist.

## 2024-06-17 ENCOUNTER — Other Ambulatory Visit: Payer: Self-pay | Admitting: Obstetrics and Gynecology

## 2024-06-17 ENCOUNTER — Other Ambulatory Visit (HOSPITAL_COMMUNITY): Payer: Self-pay

## 2024-06-17 DIAGNOSIS — N952 Postmenopausal atrophic vaginitis: Secondary | ICD-10-CM

## 2024-06-17 MED ORDER — WEGOVY 25 MG PO TABS: 25.0000 mg | ORAL_TABLET | Freq: Every day | ORAL | 3 refills | Status: AC

## 2024-06-17 MED ORDER — WEGOVY 4 MG PO TABS: 4.0000 mg | ORAL_TABLET | Freq: Every day | ORAL | 0 refills | Status: AC

## 2024-06-17 MED ORDER — WEGOVY 1.5 MG PO TABS: 1.5000 mg | ORAL_TABLET | Freq: Every day | ORAL | 0 refills | Status: AC

## 2024-06-17 MED ORDER — WEGOVY 9 MG PO TABS: 9.0000 mg | ORAL_TABLET | Freq: Every day | ORAL | 0 refills | Status: AC

## 2024-06-17 NOTE — Telephone Encounter (Signed)
 Per DPR LVM that rx has been sent in.

## 2024-06-17 NOTE — Telephone Encounter (Signed)
 Duplicate refill request for estradiol  10 mcg vaginal tablets.  RF request already sent to provider for approval.  Duplicate denied.

## 2024-06-17 NOTE — Telephone Encounter (Signed)
 Med refill request: estradiol  10 mcg  Last AEX: 08/15/23 Next AEX: 08/17/24 Last MMG (if hormonal med) 06/11/23 BI-RADS 3 probably benign Last refill: 03/26/2024 Refill authorized: estradiol  10 mcg vaginal tablet Sent to provider for review.  Patient with history of seizures.

## 2024-06-17 NOTE — Telephone Encounter (Signed)
 Sent in for her

## 2024-06-17 NOTE — Addendum Note (Signed)
 Addended by: ROLLENE NORRIS A on: 06/17/2024 09:55 AM   Modules accepted: Orders

## 2024-06-18 ENCOUNTER — Telehealth: Payer: Self-pay

## 2024-06-18 ENCOUNTER — Other Ambulatory Visit (HOSPITAL_COMMUNITY): Payer: Self-pay

## 2024-06-18 MED ORDER — NAYZILAM 5 MG/0.1ML NA SOLN: NASAL | 5 refills | Status: AC

## 2024-06-18 NOTE — Telephone Encounter (Signed)
 It is unlikely that a tier exception would reduce the copay considering that pt also has an unmet deductible as well. However, I was able to find a copay card that she is eligible for that reduces her copay down to $20. I have registered her already and have included that information below. Test billing with [facility] worked and it did process with patients insurance and worked reducing it down to $20. Patient would be able to use this information at pharmacy of her choice and get it filled with her insurance.

## 2024-06-18 NOTE — Telephone Encounter (Signed)
 We can try, I sent in Rx. Let's see if we get same response

## 2024-06-18 NOTE — Telephone Encounter (Signed)
 Pt called an informed that she is eligible for copay card that reduces her copay down to $20

## 2024-06-18 NOTE — Telephone Encounter (Signed)
 Can we check and see if a PA is needed  for Nayzilam  for this pt

## 2024-06-18 NOTE — Telephone Encounter (Signed)
 PA not needed but copay is high.

## 2024-06-23 ENCOUNTER — Other Ambulatory Visit (HOSPITAL_COMMUNITY): Payer: Self-pay

## 2024-06-24 ENCOUNTER — Other Ambulatory Visit (HOSPITAL_COMMUNITY): Payer: Self-pay

## 2024-06-24 ENCOUNTER — Telehealth: Payer: Self-pay

## 2024-06-24 NOTE — Telephone Encounter (Signed)
 Copied from CRM [id number]. Topic: Clinical - Prescription Issue >> Jun 24, 2024  1:05 PM Mia F wrote: Reason for CRM: Pt says that the pharmacy told her that Dr Rollene sent too many of the Wegovy  medication and her insurance is not approving it. They want her to resend the one that she is to start with first so the insurance company can approve it. Please send semaglutide -weight management (WEGOVY ) 1.5 MG tablet to the [id number] [place], [place] - [address] AT [facility] [address] [facility] [address] [facility] [address] [facility] [address] [place] [address] Phone: [phone] Fax: [phone]

## 2024-06-25 ENCOUNTER — Telehealth: Payer: Self-pay

## 2024-06-25 ENCOUNTER — Other Ambulatory Visit: Payer: Self-pay

## 2024-06-25 ENCOUNTER — Telehealth: Payer: Self-pay | Admitting: Neurology

## 2024-06-25 ENCOUNTER — Other Ambulatory Visit (HOSPITAL_COMMUNITY): Payer: Self-pay

## 2024-06-25 DIAGNOSIS — N952 Postmenopausal atrophic vaginitis: Secondary | ICD-10-CM

## 2024-06-25 MED ORDER — MEDROXYPROGESTERONE ACETATE 150 MG/ML IM SUSY: 1.0000 mL | PREFILLED_SYRINGE | INTRAMUSCULAR | 0 refills | Status: AC

## 2024-06-25 MED ORDER — ESTRADIOL 10 MCG VA TABS: 10.0000 ug | ORAL_TABLET | VAGINAL | 0 refills | Status: AC

## 2024-06-25 NOTE — Telephone Encounter (Signed)
 Copied from CRM #[id number]. Topic: Clinical - Prescription Issue >> Jun 25, 2024 10:30 AM Tonda B wrote: Reason for CRM: patient wants to let her provider know that she is switching pharmacy she will now be using [facility]

## 2024-06-25 NOTE — Telephone Encounter (Unsigned)
 Copied from CRM #[id number]. Topic: Clinical - Prescription Issue >> Jun 25, 2024 10:30 AM Tonda B wrote: Reason for CRM: patient wants to let her provider know that she is switching pharmacy she will now be using [facility] >> Jun 25, 2024 11:57 AM Mia F wrote: Pt is calling to let office know that her insurance does not cover the  wEGOVY  1.5 MG. Pt was asked if she need a PA but she says she was just called by the office and told that the insurance company will not cover the medication. Pt says that the rx costs over $1000 and she would like to find something cheaper. Pt was told that the PA does not shows that it has been approved or denied yet based on the notes but she says it was denied and the insurance does not cover it.

## 2024-06-25 NOTE — Telephone Encounter (Signed)
 Patient called asking that meds be switched to the [facility] on [address].   Med refill request: estradiol  10 mcg  Last AEX: 08/15/23 Next AEX: 08/17/24 Last MMG (if hormonal med) 06/11/23 BI-RADS 3 probably benign Last refill: 03/26/2024 Refill authorized: estradiol  10 mcg vaginal tablet Sent to provider for review.  Patient with history of seizures.     Med refill request: medroxyprogesterone  150mg /ml  Last AEX: 08/15/23 Next AEX: 08/17/24 Last MMG (if hormonal med) 06/11/23 BI-RADS 3 probably benign Last refill: 03/26/2024 Refill authorized: estradiol  10 mcg vaginal tablet Sent to provider for review.  Patient with history of seizures.

## 2024-06-25 NOTE — Telephone Encounter (Signed)
 Pharmacy Patient Advocate Encounter   Received notification from Physician's Office that prior authorization for Wegovy  1.5mg  tab is required/requested.   Insurance verification completed.   The patient is insured through [id number].   Per test claim: Per test claim, medication is not covered due to plan/benefit exclusion, PA not submitted at this time  Due to Plan Exclusion- not covered by insurance.

## 2024-06-25 NOTE — Telephone Encounter (Signed)
 Pt called in this morning and she has switch her Pharmacy to [facility] on [address] [place]. Pt stated that she is not longer using [facility]. Thanks

## 2024-06-25 NOTE — Telephone Encounter (Signed)
 Pt informed that Wegovy  was not covered by insurance due to insurance plan. Pt asked about cost. Advised to contact pharmacy to see what the out-of-pocket cost would be and to see if there are any coupons available. Pt will contact us  with these findings.

## 2024-06-25 NOTE — Telephone Encounter (Addendum)
 Error

## 2024-06-26 NOTE — Telephone Encounter (Signed)
 Called and spoke with patient advising use of coupon. She noted even with this that the medication would be outside of what would be financially feasible for her. She was wanting to know if there would be any alternatives she could try? I advised her to reach out to her insurance provider to see what would be covered and we would go from there

## 2024-06-26 NOTE — Telephone Encounter (Signed)
Pharmacy has been updated in chart.

## 2024-06-30 ENCOUNTER — Telehealth: Payer: Self-pay | Admitting: Neurology

## 2024-06-30 ENCOUNTER — Telehealth: Payer: Self-pay

## 2024-06-30 NOTE — Telephone Encounter (Signed)
 Pt. Went to pickup Nasl spray using discount card, could not be combined with Medicare. She can not afford it and will wait to next appt for next steps.

## 2024-06-30 NOTE — Telephone Encounter (Signed)
 Pharmacy Patient Advocate Encounter   Received notification from Physician's Office that prior authorization for Wegovy  1.5mg  tab is required/requested.   Insurance verification completed.   The patient is insured through [id number].   Per test claim: Per test claim, medication is not covered due to plan/benefit exclusion, PA not submitted at this time  Due to Plan Exclusion- not covered by insurance.

## 2024-07-01 ENCOUNTER — Telehealth: Payer: Self-pay

## 2024-07-01 NOTE — Telephone Encounter (Signed)
 Copied from CRM [id number]. Topic: Referral - Request for Referral >> Jul 01, 2024  2:46 PM Kevelyn M wrote: Did the patient discuss referral with their provider in the last year? Yes Patient calling because she called [facility] needs the referral for a mammogram.   Appointment offered? No  Type of order/referral and detailed reason for visit: Mammogram  Preference of office, provider, location: [facility]  If referral order, have you been seen by this specialty before? Yes (If Yes, this issue or another issue? When? Where?  Can we respond through MyChart? Yes

## 2024-07-02 NOTE — Telephone Encounter (Signed)
 Order is in epic should go to referrals to help

## 2024-07-02 NOTE — Telephone Encounter (Signed)
 Referral printed and faxed to [facility]. Pt is aware.

## 2024-08-17 ENCOUNTER — Ambulatory Visit: Admitting: Obstetrics and Gynecology

## 2024-09-10 ENCOUNTER — Ambulatory Visit: Payer: Self-pay | Admitting: Neurology
